# Patient Record
Sex: Male | Born: 1940 | Race: White | Hispanic: No | Marital: Married | Smoking: Former smoker
Health system: Southern US, Community
[De-identification: ages and names within clinical notes are randomized; demographics above are authoritative.]

## PROBLEM LIST (undated history)

## (undated) DIAGNOSIS — R51 Headache: Secondary | ICD-10-CM

## (undated) DIAGNOSIS — I629 Nontraumatic intracranial hemorrhage, unspecified: Secondary | ICD-10-CM

## (undated) DIAGNOSIS — H547 Unspecified visual loss: Secondary | ICD-10-CM

## (undated) DIAGNOSIS — N401 Enlarged prostate with lower urinary tract symptoms: Secondary | ICD-10-CM

## (undated) DIAGNOSIS — K635 Polyp of colon: Secondary | ICD-10-CM

## (undated) DIAGNOSIS — L309 Dermatitis, unspecified: Secondary | ICD-10-CM

## (undated) DIAGNOSIS — I4891 Unspecified atrial fibrillation: Secondary | ICD-10-CM

## (undated) DIAGNOSIS — K219 Gastro-esophageal reflux disease without esophagitis: Secondary | ICD-10-CM

## (undated) DIAGNOSIS — R519 Headache, unspecified: Secondary | ICD-10-CM

## (undated) DIAGNOSIS — M75100 Unspecified rotator cuff tear or rupture of unspecified shoulder, not specified as traumatic: Secondary | ICD-10-CM

## (undated) DIAGNOSIS — I10 Essential (primary) hypertension: Secondary | ICD-10-CM

## (undated) DIAGNOSIS — G629 Polyneuropathy, unspecified: Secondary | ICD-10-CM

## (undated) DIAGNOSIS — I2510 Atherosclerotic heart disease of native coronary artery without angina pectoris: Secondary | ICD-10-CM

## (undated) DIAGNOSIS — M5416 Radiculopathy, lumbar region: Secondary | ICD-10-CM

## (undated) DIAGNOSIS — I472 Ventricular tachycardia, unspecified: Secondary | ICD-10-CM

## (undated) DIAGNOSIS — F419 Anxiety disorder, unspecified: Secondary | ICD-10-CM

## (undated) DIAGNOSIS — J309 Allergic rhinitis, unspecified: Secondary | ICD-10-CM

## (undated) DIAGNOSIS — M545 Low back pain, unspecified: Secondary | ICD-10-CM

## (undated) DIAGNOSIS — N419 Inflammatory disease of prostate, unspecified: Secondary | ICD-10-CM

## (undated) DIAGNOSIS — K649 Unspecified hemorrhoids: Secondary | ICD-10-CM

## (undated) DIAGNOSIS — R251 Tremor, unspecified: Secondary | ICD-10-CM

## (undated) DIAGNOSIS — F329 Major depressive disorder, single episode, unspecified: Secondary | ICD-10-CM

## (undated) DIAGNOSIS — N138 Other obstructive and reflux uropathy: Secondary | ICD-10-CM

## (undated) DIAGNOSIS — J189 Pneumonia, unspecified organism: Secondary | ICD-10-CM

## (undated) DIAGNOSIS — R319 Hematuria, unspecified: Secondary | ICD-10-CM

## (undated) DIAGNOSIS — D649 Anemia, unspecified: Secondary | ICD-10-CM

## (undated) DIAGNOSIS — M5412 Radiculopathy, cervical region: Secondary | ICD-10-CM

## (undated) DIAGNOSIS — J029 Acute pharyngitis, unspecified: Secondary | ICD-10-CM

## (undated) DIAGNOSIS — F1021 Alcohol dependence, in remission: Secondary | ICD-10-CM

## (undated) DIAGNOSIS — H4310 Vitreous hemorrhage, unspecified eye: Secondary | ICD-10-CM

## (undated) DIAGNOSIS — H409 Unspecified glaucoma: Secondary | ICD-10-CM

## (undated) DIAGNOSIS — G4730 Sleep apnea, unspecified: Secondary | ICD-10-CM

## (undated) DIAGNOSIS — N200 Calculus of kidney: Secondary | ICD-10-CM

## (undated) DIAGNOSIS — Z87442 Personal history of urinary calculi: Secondary | ICD-10-CM

## (undated) DIAGNOSIS — F32A Depression, unspecified: Secondary | ICD-10-CM

## (undated) DIAGNOSIS — G63 Polyneuropathy in diseases classified elsewhere: Secondary | ICD-10-CM

## (undated) DIAGNOSIS — I639 Cerebral infarction, unspecified: Secondary | ICD-10-CM

## (undated) DIAGNOSIS — E119 Type 2 diabetes mellitus without complications: Secondary | ICD-10-CM

## (undated) DIAGNOSIS — E785 Hyperlipidemia, unspecified: Secondary | ICD-10-CM

## (undated) DIAGNOSIS — Z950 Presence of cardiac pacemaker: Secondary | ICD-10-CM

## (undated) DIAGNOSIS — R0989 Other specified symptoms and signs involving the circulatory and respiratory systems: Secondary | ICD-10-CM

## (undated) DIAGNOSIS — M1990 Unspecified osteoarthritis, unspecified site: Secondary | ICD-10-CM

## (undated) DIAGNOSIS — R001 Bradycardia, unspecified: Secondary | ICD-10-CM

## (undated) DIAGNOSIS — R58 Hemorrhage, not elsewhere classified: Secondary | ICD-10-CM

## (undated) HISTORY — PX: OTHER SURGICAL HISTORY: SHX169

## (undated) HISTORY — PX: INTRAOCULAR LENS INSERTION: SHX110

## (undated) HISTORY — PX: CIRCUMCISION: SUR203

## (undated) HISTORY — PX: COLONOSCOPY: SHX174

## (undated) HISTORY — PX: SHOULDER SURGERY: SHX246

## (undated) HISTORY — PX: VASECTOMY: SHX75

## (undated) HISTORY — PX: CYSTOSCOPY: SUR368

## (undated) HISTORY — PX: TRACHEOSTOMY: SUR1362

## (undated) HISTORY — PX: URETHROPLASTY: SHX499

---

## 2010-09-19 MED ORDER — MOMETASONE FUROATE 0.1 % EX CREA
0.1 % | CUTANEOUS | Status: DC
Start: 2010-09-19 — End: 2010-11-30

## 2010-09-20 NOTE — Telephone Encounter (Signed)
Requested Prescriptions     Pending Prescriptions Disp Refills   ??? mometasone (ELOCON) 0.1 % cream 45 g      Sig: Apply topically daily.

## 2010-09-21 MED ORDER — MOMETASONE FUROATE 0.1 % EX CREA
0.1 % | CUTANEOUS | Status: DC
Start: 2010-09-21 — End: 2010-11-30

## 2010-11-27 MED ORDER — FLUOXETINE HCL 40 MG PO CAPS
40 MG | ORAL_CAPSULE | ORAL | Status: DC
Start: 2010-11-27 — End: 2010-11-30

## 2010-11-27 NOTE — Telephone Encounter (Signed)
Pt requests refill on medication. Please approve or deny this request.

## 2010-11-30 MED ORDER — LOVASTATIN 40 MG PO TABS
40 MG | ORAL_TABLET | Freq: Every evening | ORAL | Status: DC
Start: 2010-11-30 — End: 2011-12-14

## 2010-11-30 MED ORDER — FLUOXETINE HCL 40 MG PO CAPS
40 MG | ORAL_CAPSULE | Freq: Every day | ORAL | Status: DC
Start: 2010-11-30 — End: 2011-12-14

## 2010-11-30 MED ORDER — LISINOPRIL 20 MG PO TABS
20 MG | ORAL_TABLET | Freq: Every day | ORAL | Status: DC
Start: 2010-11-30 — End: 2011-01-01

## 2010-11-30 MED ORDER — MOMETASONE FUROATE 0.1 % EX CREA
0.1 % | CUTANEOUS | Status: DC
Start: 2010-11-30 — End: 2011-12-14

## 2010-11-30 MED ORDER — NAPROXEN 500 MG PO TABS
500 MG | ORAL_TABLET | Freq: Two times a day (BID) | ORAL | Status: DC
Start: 2010-11-30 — End: 2011-07-24

## 2010-11-30 NOTE — Patient Instructions (Addendum)
Thank you for enrolling in MyChart. Please follow the instructions below to securely access your online medical record. MyChart allows you to send messages to your doctor, view your test results, renew your prescriptions, schedule appointments, and more.     How Do I Sign Up?  1. In your Internet browser, go to [email or web address]  2. Click on the Sign Up Now link in the Sign In box. You will see the New Member Sign Up page.  3. Enter your MyChart Access Code exactly as it appears below. You will not need to use this code after you???ve completed the sign-up process. If you do not sign up before the expiration date, you must request a new code.  MyChart Access Code: [email or web address]  Expires: 01/29/2011  9:58 AM    4. Enter your Social Security Number ([id number]) and Date of Birth (mm/dd/yyyy) as indicated and click Submit. You will be taken to the next sign-up page.  5. Create a MyChart ID. This will be your MyChart login ID and cannot be changed, so think of one that is secure and easy to remember.  6. Create a MyChart password. You can change your password at any time.  7. Enter your Password Reset Question and Answer. This can be used at a later time if you forget your password.   8. Enter your e-mail address. You will receive e-mail notification when new information is available in MyChart.  9. Click Sign Up. You can now view your medical record.     Additional Information  If you have questions, please contact your physician practice where you receive care. Remember, MyChart is NOT to be used for urgent needs. For medical emergencies, dial 911.    High Blood Pressure   (Blood Pressure, High; Essential Hypertension; Idiopathic Hypertension; Primary Hypertension)     Definition   High blood pressure is abnormally high blood pressure with no known cause. Blood pressure measurements are read as two numbers:   ?? Systolic pressure: higher number, normal reading is 120 millimeters of mercury (mmHg) or  less   ?? Diastolic pressure: lower number, normal reading is 80 mmHg or less   High blood pressure is defined as systolic pressure greater than 140 mmHg and/or diastolic pressure greater than 90 mmHg. You are considered prehypertensive if your systolic blood pressure is between 120-139 mmHg, or your diastolic pressure is between 80- 89 mmHg. Your doctor will recommend monitoring and lifestyle changes.   High blood pressure puts stress on the heart, lungs, brain, kidneys, and blood vessels. Over time, this condition can damage these organs and tissues.     Organs Impacted by High Blood Pressure        2011 [employer]   Causes   The cause of essential hypertension is not known.   Risk Factors   These factors increase your chance of developing high blood pressure. Tell your doctor if you have any of these risk factors:   ?? Sex:   ?? Male   ?? Postmenopausal male   ?? Race: Black   ?? Age: middle-aged and older   ?? Overweight   ?? Heavy drinking of alcohol   ?? Smoking   ?? Use of oral contraceptives (birth control pills)   ?? Sedentary lifestyle   ?? Family history   ?? Kidney disease   ?? Diabetes   ?? High-fat, high-salt diet   ?? Stress   Symptoms   High blood pressure usually does not cause symptoms. But, the  condition can still damage your organs and tissues.   Occasionally, if blood pressure reaches extreme levels, you may have the following:   ?? Headache   ?? Blurry or double vision   ?? Abdominal pain   ?? Chest pain   ?? Shortness of breath   ?? Dizziness   Diagnosis   High blood pressure is often diagnosed during a doctor's visit. Blood pressure is measured using an arm cuff and a special device. If your reading is high, you'll come back for repeat checks. If you have two or more visits with readings over 140/90 mmHG, you will be diagnosed with high blood pressure.   Your doctor will order tests to make sure your high blood pressure is not caused by another condition. You will also be tested to see if the  high blood pressure has cause any problems.   Tests include:   ?? Blood tests   ?? Urine tests   ?? Chest x-rays a test that uses radiation to take a picture of structures inside the body   ?? Electrocardiogram (ECG, EKG) a test that records the heart's activity by measuring electrical currents through the heart muscle   Treatment   Lifestyle Changes   ?? Maintain a healthy weight .   ?? Begin a safe exercise program with the advice of your doctor.   ?? If you smoke, quit .   ?? Eat a healthful diet , one that is low-fat, low-salt, and rich in fiber , fruits, and vegetables. Your doctor may recommend the DASH diet , which is designed to reduce blood pressure.   ?? Drink alcohol in moderation (no more than two drinks per day for men, one drink per day for women).   ?? Manage stress .   Medications   These include:   ?? Diuretics   ?? Beta blockers   ?? Angiotensin converting enzyme inhibitors (ACE inhibitors)   ?? Calcium channel blockers   ?? Angiotensin receptor blockers   ?? Aldosterone blockers   ?? Alpha blockers   ?? Alpha-beta blockers   ?? Nervous system inhibitors   ?? Vasodilators   Note: Untreated high blood pressure can lead to:   ?? Heart disease   ?? Heart attack   ?? Stroke   ?? Kidney damage   If you are diagnosed with high blood pressure, follow your doctor's instructions .   Prevention   To help reduce your risk of getting high blood pressure, take the following steps:   ?? Eat a well-balanced diet. The DASH diet rich in fruits, vegetables and low-fat dairy foods, and low in saturated fat, total fat, and cholesterolmay help keep your blood pressure in the healthy range.   ?? Exercise regularly.   ?? Maintain a healthy weight. (Your body mass index should be below 25.)   ?? If you smoke, quit .   ?? Drink alcohol in moderation. Moderate is two or fewer drinks per day for men and one or fewer drinks per day for women and older adults.     Last Reviewed: September 2010 Christian Mate. [place], DO   Updated: 08/29/2009

## 2010-11-30 NOTE — Progress Notes (Signed)
Patient is here today for annual check up. He is fasting for blood work.  Chief Complaint   Patient presents with   ??? Annual Exam     is fasting for blood work       Patient presents for  exam.        Patient Active Problem List   Diagnoses   ??? Other and unspecified hyperlipidemia   ??? HTN (hypertension)   ??? Osteoarthrosis, unspecified whether generalized or localized, unspecified site   ??? Depressed   ??? Dermatitis       has a current medication list which includes the following prescription(s): lovastatin, lisinopril, naproxen, fluoxetine, and mometasone.    History reviewed. No pertinent past medical history.    History reviewed. No pertinent past surgical history.    family history is not on file.    History     Social History   ??? Marital Status: Single     Spouse Name: N/A     Number of Children: N/A   ??? Years of Education: N/A     Occupational History   ??? Not on file.     Social History Main Topics   ??? Smoking status: Former Smoker -- 1.0 packs/day for 40 years     Quit date: 12/01/1995   ??? Smokeless tobacco: Never Used   ??? Alcohol Use:    ??? Drug Use:    ??? Sexually Active:      Other Topics Concern   ??? Not on file     Social History Narrative   ??? No narrative on file       No Known Allergies    Review of Systems - General ROS: negative  Psychological ROS: negative  ENT ROS: negative  Hematological and Lymphatic ROS: negative  Endocrine ROS: negative  Respiratory ROS: no cough, shortness of breath, or wheezing  Cardiovascular ROS: no chest pain or dyspnea on exertion  Gastrointestinal ROS: no abdominal pain, change in bowel habits, or black or bloody stools  Genito-Urinary ROS: no dysuria, trouble voiding, or hematuria  Musculoskeletal ROS: negative  Neurological ROS: no TIA or stroke symptoms  Dermatological ROS: negative    Blood pressure 120/80, pulse 72, temperature 97.1 ??F (36.2 ??C), temperature source Oral, resp. rate 16, height 5' 11.5" (1.816 m), weight 256 lb 1.6 oz (116.166 kg).    Physical Examination:  General appearance - alert, well appearing, and in no distress  Mental status - alert, oriented to person, place, and time  Eyes - pupils equal and reactive, extraocular eye movements intact  Ears - bilateral TM's and external ear canals normal  Mouth - mucous membranes moist, pharynx normal without lesions  Neck - supple, no significant adenopathy  Lymphatics - no palpable lymphadenopathy, no hepatosplenomegaly  Chest - clear to auscultation, no wheezes, rales or rhonchi, symmetric air entry  Heart - normal rate, regular rhythm, normal S1, S2, no murmurs, rubs, clicks or gallops  Abdomen - soft, nontender, nondistended, no masses or organomegaly  GU Male - no penile lesions or discharge, no testicular masses or tenderness, no hernias  Rectal - negative without mass, lesions or tenderness  Neurological - alert, oriented, normal speech, no focal findings or movement disorder noted  Musculoskeletal - no joint tenderness, deformity or swelling  Extremities - peripheral pulses normal, no pedal edema, no clubbing or cyanosis  Skin - normal coloration and turgor, no rashes, no suspicious skin lesions noted;    Assessment:    1. Dermatitis  2. Depressed     3. Osteoarthrosis, unspecified whether generalized or localized, unspecified site     4. HTN (hypertension)  Comprehensive metabolic panel, Lipid panel, EKG 12 lead, Stress test, myoview   5. Other and unspecified hyperlipidemia  Comprehensive metabolic panel, Lipid panel, Stress test, myoview   6. Prostate cancer screening  Psa screening   7. DOE (dyspnea on exertion)  Stress test, myoview       Plan:    Orders Placed This Encounter   Procedures   ??? Comprehensive metabolic panel   ??? Lipid panel     Order Specific Question:  Is Patient Fasting?/# of Hours     Answer:  12   ??? Psa screening   ??? Stress test, myoview     Order Specific Question:  Reason for Exam?     Answer:  Shortness of breath   ??? EKG 12 lead     Order Specific Question:  Reason for Exam?     Answer:   Hypertension       Outpatient Encounter Prescriptions as of 11/30/2010   Medication Sig Dispense Refill   ??? lovastatin (MEVACOR) 40 MG tablet Take 1 tablet by mouth nightly.  90 tablet  3   ??? lisinopril (PRINIVIL;ZESTRIL) 20 MG tablet Take 1 tablet by mouth daily.  90 tablet  3   ??? naproxen (NAPROSYN) 500 MG tablet Take 1 tablet by mouth 2 times daily (with meals).  90 tablet  3   ??? fluoxetine (PROZAC) 40 MG capsule Take 1 capsule by mouth daily.  90 capsule  3   ??? mometasone (ELOCON) 0.1 % cream Apply topically daily.  45 g  3   ??? DISCONTD: lovastatin (MEVACOR) 40 MG tablet Take 40 mg by mouth nightly.         ??? DISCONTD: lisinopril (PRINIVIL;ZESTRIL) 20 MG tablet Take 20 mg by mouth daily.         ??? DISCONTD: naproxen (NAPROSYN) 500 MG tablet Take 500 mg by mouth 2 times daily (with meals).         ??? DISCONTD: fluoxetine (PROZAC) 40 MG capsule TAKE 1 CAPSULE DAILY  90 capsule  2   ??? DISCONTD: mometasone (ELOCON) 0.1 % cream Apply topically daily.  45 g  3   ??? DISCONTD: mometasone (ELOCON) 0.1 % cream APPLY TO THE AFFECTED AREA TWICE DAILY  45 g  3         Return in about 3 months (around 03/01/2011).    Patient education provided. They understand and agree with this course of treatment. They will return with new or worsening symptoms. Patient instructed to remain current with appropriate annual health maintenance.

## 2010-12-01 LAB — COMPREHENSIVE METABOLIC PANEL
ALT: 22 U/L (ref 0–63)
AST: 27 U/L (ref 0–40)
Albumin: 4.3 g/dL (ref 3.5–5.2)
Alk Phosphatase: 104 U/L (ref 40–120)
Anion Gap: 11 mEq/L (ref 7–13)
BUN: 20 mg/dL (ref 10–26)
CO2: 26 mEq/L (ref 22–32)
Calcium: 9 mg/dL (ref 8.5–10.2)
Chloride: 105 mEq/L (ref 99–111)
Creatinine: 0.86 mg/dL (ref 0.70–1.40)
GFR African American: 106.6
GFR Non-African American: 88.1
Globulin: 2.9 g/dL (ref 2.3–3.5)
Glucose: 78 mg/dL (ref 60–115)
Potassium: 4.8 mEq/L (ref 3.5–5.5)
Sodium: 142 mEq/L (ref 135–146)
Total Bilirubin: 0.5 mg/dL (ref 0.2–1.1)
Total Protein: 7.2 g/dL (ref 6.4–8.1)

## 2010-12-01 LAB — LIPID PANEL
Chol/HDL Ratio: 3.6
Cholesterol: 174 mg/dL (ref 0–199)
HDL: 48 mg/dL (ref 40–59)
LDL Calculated: 106 mg/dL (ref 0–129)
LDL/HDL Ratio: 2.2 (ref 0.0–2.9)
Triglycerides: 127 mg/dL (ref 0–149)

## 2010-12-01 LAB — PSA SCREENING: PSA: 0.46 ng/mL (ref 0.00–4.00)

## 2011-01-01 MED ORDER — LISINOPRIL 20 MG PO TABS
20 MG | ORAL_TABLET | ORAL | Status: DC
Start: 2011-01-01 — End: 2011-09-05

## 2011-02-05 ENCOUNTER — Inpatient Hospital Stay
Admission: AD | Admit: 2011-02-05 | Discharge: 2011-03-01 | Disposition: A | Discharge status: Home / Self Care | Payer: Self-pay | Source: Ambulatory Visit | Attending: Internal Medicine | Admitting: Internal Medicine

## 2011-02-05 LAB — URINALYSIS, ROUTINE W REFLEX MICROSCOPIC
Bilirubin Urine: NEGATIVE
Ketones, ur: NEGATIVE mg/dL
Nitrite: NEGATIVE
Protein, ur: NEGATIVE mg/dL
Urobilinogen, UA: 0.2 mg/dL (ref 0.0–1.0)

## 2011-02-06 ENCOUNTER — Other Ambulatory Visit (HOSPITAL_COMMUNITY): Payer: Self-pay | Attending: Internal Medicine

## 2011-02-06 LAB — COMPREHENSIVE METABOLIC PANEL
AST: 39 U/L — ABNORMAL HIGH (ref 0–37)
Albumin: 2.3 g/dL — ABNORMAL LOW (ref 3.5–5.2)
BUN: 13 mg/dL (ref 6–23)
CO2: 28 mEq/L (ref 19–32)
Calcium: 8.7 mg/dL (ref 8.4–10.5)
Chloride: 105 mEq/L (ref 96–112)
Creatinine, Ser: 0.64 mg/dL (ref 0.4–1.5)
GFR calc Af Amer: >60 mL/min (ref >60)
GFR calc non Af Amer: >60 mL/min (ref >60)
Total Bilirubin: 0.5 mg/dL (ref 0.3–1.2)

## 2011-02-06 LAB — URINE CULTURE: Culture  Setup Time: 201202271852

## 2011-02-06 LAB — PREALBUMIN: Prealbumin: 14.1 mg/dL — ABNORMAL LOW (ref 17.0–34.0)

## 2011-02-06 LAB — DIFFERENTIAL
Basophils Relative: 0 % (ref 0–1)
Eosinophils Absolute: 0.2 10*3/uL (ref 0.0–0.7)
Lymphs Abs: 0.9 10*3/uL (ref 0.7–4.0)
Monocytes Absolute: 0.8 10*3/uL (ref 0.1–1.0)
Monocytes Relative: 12 % (ref 3–12)

## 2011-02-06 LAB — TSH: TSH: 0.489 u[IU]/mL (ref 0.350–4.500)

## 2011-02-06 LAB — CBC
MCH: 30.6 pg (ref 26.0–34.0)
MCHC: 32.7 g/dL (ref 30.0–36.0)
MCV: 93.6 fL (ref 78.0–100.0)
Platelets: 319 10*3/uL (ref 150–400)

## 2011-02-06 LAB — PHOSPHORUS: Phosphorus: 3 mg/dL (ref 2.3–4.6)

## 2011-02-07 ENCOUNTER — Other Ambulatory Visit (HOSPITAL_COMMUNITY): Payer: Self-pay | Attending: Internal Medicine

## 2011-02-07 LAB — URINALYSIS, ROUTINE W REFLEX MICROSCOPIC
Ketones, ur: NEGATIVE mg/dL
Protein, ur: 30 mg/dL — AB
Urine Glucose, Fasting: NEGATIVE mg/dL
Urobilinogen, UA: 0.2 mg/dL (ref 0.0–1.0)

## 2011-02-07 LAB — URINE MICROSCOPIC-ADD ON

## 2011-02-07 LAB — CBC
HCT: 34.6 % — ABNORMAL LOW (ref 39.0–52.0)
Hemoglobin: 11.3 g/dL — ABNORMAL LOW (ref 13.0–17.0)
MCV: 92 fL (ref 78.0–100.0)
RDW: 13.6 % (ref 11.5–15.5)
WBC: 9.5 10*3/uL (ref 4.0–10.5)

## 2011-02-07 LAB — COMPREHENSIVE METABOLIC PANEL
ALT: 28 U/L (ref 0–53)
Alkaline Phosphatase: 150 U/L — ABNORMAL HIGH (ref 39–117)
BUN: 15 mg/dL (ref 6–23)
CO2: 28 mEq/L (ref 19–32)
Chloride: 103 mEq/L (ref 96–112)
GFR calc non Af Amer: >60 mL/min (ref >60)
Glucose, Bld: 164 mg/dL — ABNORMAL HIGH (ref 70–99)
Potassium: 4.3 mEq/L (ref 3.5–5.1)
Sodium: 139 mEq/L (ref 135–145)
Total Bilirubin: 0.5 mg/dL (ref 0.3–1.2)
Total Protein: 6.8 g/dL (ref 6.0–8.3)

## 2011-02-09 LAB — CBC
HCT: 31.7 % — ABNORMAL LOW (ref 39.0–52.0)
Hemoglobin: 10.2 g/dL — ABNORMAL LOW (ref 13.0–17.0)
MCH: 30.2 pg (ref 26.0–34.0)
MCHC: 32.2 g/dL (ref 30.0–36.0)
RDW: 14.1 % (ref 11.5–15.5)

## 2011-02-09 LAB — BASIC METABOLIC PANEL
CO2: 26 mEq/L (ref 19–32)
Calcium: 8.4 mg/dL (ref 8.4–10.5)
Creatinine, Ser: 0.77 mg/dL (ref 0.4–1.5)
GFR calc Af Amer: >60 mL/min (ref >60)
GFR calc non Af Amer: >60 mL/min (ref >60)
Glucose, Bld: 172 mg/dL — ABNORMAL HIGH (ref 70–99)

## 2011-02-09 LAB — URINE CULTURE
Colony Count: 85000
Culture  Setup Time: 201202292126

## 2011-02-11 LAB — URINE CULTURE
Colony Count: NO GROWTH
Culture: NO GROWTH

## 2011-02-12 DIAGNOSIS — I619 Nontraumatic intracerebral hemorrhage, unspecified: Secondary | ICD-10-CM

## 2011-02-12 DIAGNOSIS — Z930 Tracheostomy status: Secondary | ICD-10-CM

## 2011-02-12 DIAGNOSIS — J9600 Acute respiratory failure, unspecified whether with hypoxia or hypercapnia: Secondary | ICD-10-CM

## 2011-02-12 LAB — CBC
Platelets: 272 10*3/uL (ref 150–400)
RBC: 3.95 MIL/uL — ABNORMAL LOW (ref 4.22–5.81)
WBC: 7.7 10*3/uL (ref 4.0–10.5)

## 2011-02-12 LAB — BASIC METABOLIC PANEL
Chloride: 106 mEq/L (ref 96–112)
GFR calc Af Amer: >60 mL/min (ref >60)
Potassium: 3.8 mEq/L (ref 3.5–5.1)
Sodium: 140 mEq/L (ref 135–145)

## 2011-02-12 LAB — PREALBUMIN: Prealbumin: 13 mg/dL — ABNORMAL LOW (ref 17.0–34.0)

## 2011-02-14 ENCOUNTER — Other Ambulatory Visit (HOSPITAL_COMMUNITY): Payer: Self-pay | Attending: Internal Medicine

## 2011-02-16 LAB — BASIC METABOLIC PANEL
BUN: 16 mg/dL (ref 6–23)
Creatinine, Ser: 0.71 mg/dL (ref 0.4–1.5)
GFR calc Af Amer: >60 mL/min (ref >60)
GFR calc non Af Amer: >60 mL/min (ref >60)
Potassium: 4.2 mEq/L (ref 3.5–5.1)

## 2011-02-16 LAB — URINALYSIS, ROUTINE W REFLEX MICROSCOPIC
Ketones, ur: NEGATIVE mg/dL
Leukocytes, UA: NEGATIVE
Nitrite: NEGATIVE
Protein, ur: NEGATIVE mg/dL
Urobilinogen, UA: 1 mg/dL (ref 0.0–1.0)

## 2011-02-16 LAB — CBC
MCH: 30.4 pg (ref 26.0–34.0)
MCV: 93.5 fL (ref 78.0–100.0)
Platelets: 298 10*3/uL (ref 150–400)
RDW: 13.5 % (ref 11.5–15.5)
WBC: 6.6 10*3/uL (ref 4.0–10.5)

## 2011-02-18 LAB — URINE CULTURE
Colony Count: NO GROWTH
Culture: NO GROWTH

## 2011-02-19 LAB — DIGOXIN LEVEL: Digoxin Level: 0.2 ng/mL — ABNORMAL LOW (ref 0.8–2.0)

## 2011-02-19 LAB — POTASSIUM: Potassium: 4.5 mEq/L (ref 3.5–5.1)

## 2011-02-21 LAB — BASIC METABOLIC PANEL
CO2: 25 mEq/L (ref 19–32)
Calcium: 8.6 mg/dL (ref 8.4–10.5)
Creatinine, Ser: 0.68 mg/dL (ref 0.4–1.5)
GFR calc Af Amer: >60 mL/min (ref >60)

## 2011-02-21 LAB — CBC
Hemoglobin: 11.2 g/dL — ABNORMAL LOW (ref 13.0–17.0)
MCH: 30.6 pg (ref 26.0–34.0)
MCHC: 33.6 g/dL (ref 30.0–36.0)
Platelets: 213 10*3/uL (ref 150–400)

## 2011-02-24 LAB — URINALYSIS, ROUTINE W REFLEX MICROSCOPIC
Nitrite: POSITIVE — AB
Specific Gravity, Urine: 1.023 (ref 1.005–1.030)
Urobilinogen, UA: 2 mg/dL — ABNORMAL HIGH (ref 0.0–1.0)

## 2011-02-24 LAB — URINE MICROSCOPIC-ADD ON

## 2011-02-24 LAB — DIFFERENTIAL
Basophils Absolute: 0 10*3/uL (ref 0.0–0.1)
Eosinophils Absolute: 0.1 10*3/uL (ref 0.0–0.7)
Eosinophils Relative: 1 % (ref 0–5)
Lymphocytes Relative: 17 % (ref 12–46)

## 2011-02-24 LAB — CBC
HCT: 32.6 % — ABNORMAL LOW (ref 39.0–52.0)
Platelets: 229 10*3/uL (ref 150–400)
RDW: 13.9 % (ref 11.5–15.5)
WBC: 8.2 10*3/uL (ref 4.0–10.5)

## 2011-02-24 LAB — BASIC METABOLIC PANEL
Calcium: 8.7 mg/dL (ref 8.4–10.5)
GFR calc non Af Amer: >60 mL/min (ref >60)
Glucose, Bld: 120 mg/dL — ABNORMAL HIGH (ref 70–99)
Sodium: 137 mEq/L (ref 135–145)

## 2011-02-25 LAB — HEPATIC FUNCTION PANEL
ALT: 21 U/L (ref 0–53)
AST: 25 U/L (ref 0–37)
Alkaline Phosphatase: 93 U/L (ref 39–117)
Bilirubin, Direct: 0.1 mg/dL (ref 0.0–0.3)
Indirect Bilirubin: 0.4 mg/dL (ref 0.3–0.9)
Total Protein: 6.4 g/dL (ref 6.0–8.3)

## 2011-02-25 LAB — BASIC METABOLIC PANEL
BUN: 10 mg/dL (ref 6–23)
Chloride: 108 mEq/L (ref 96–112)
Glucose, Bld: 177 mg/dL — ABNORMAL HIGH (ref 70–99)
Potassium: 4 mEq/L (ref 3.5–5.1)

## 2011-02-26 DIAGNOSIS — F39 Unspecified mood [affective] disorder: Secondary | ICD-10-CM

## 2011-02-28 LAB — DIFFERENTIAL
Basophils Absolute: 0 10*3/uL (ref 0.0–0.1)
Eosinophils Absolute: 0.1 10*3/uL (ref 0.0–0.7)
Eosinophils Relative: 1 % (ref 0–5)
Lymphocytes Relative: 10 % — ABNORMAL LOW (ref 12–46)
Monocytes Absolute: 1 10*3/uL (ref 0.1–1.0)
Monocytes Relative: 10 % (ref 3–12)
Neutro Abs: 8.4 10*3/uL — ABNORMAL HIGH (ref 1.7–7.7)

## 2011-02-28 LAB — CBC
HCT: 31.8 % — ABNORMAL LOW (ref 39.0–52.0)
Hemoglobin: 10.4 g/dL — ABNORMAL LOW (ref 13.0–17.0)
MCH: 29.6 pg (ref 26.0–34.0)
MCHC: 32.7 g/dL (ref 30.0–36.0)
RDW: 14 % (ref 11.5–15.5)

## 2011-02-28 LAB — BASIC METABOLIC PANEL
BUN: 10 mg/dL (ref 6–23)
CO2: 24 mEq/L (ref 19–32)
Calcium: 9 mg/dL (ref 8.4–10.5)
Creatinine, Ser: 0.6 mg/dL (ref 0.4–1.5)
GFR calc non Af Amer: >60 mL/min (ref >60)
Glucose, Bld: 135 mg/dL — ABNORMAL HIGH (ref 70–99)
Sodium: 139 mEq/L (ref 135–145)

## 2011-07-25 MED ORDER — NAPROXEN 500 MG PO TABS
500 MG | ORAL_TABLET | Freq: Two times a day (BID) | ORAL | Status: DC
Start: 2011-07-25 — End: 2011-12-14

## 2011-07-26 NOTE — Telephone Encounter (Signed)
Patient aware that rx ready for pick up.

## 2011-09-05 NOTE — Telephone Encounter (Signed)
PT LAST SEEN 11/30/10.

## 2011-09-06 MED ORDER — LISINOPRIL 20 MG PO TABS
20 MG | ORAL_TABLET | Freq: Every day | ORAL | Status: DC
Start: 2011-09-06 — End: 2011-09-06

## 2011-09-06 MED ORDER — LISINOPRIL 20 MG PO TABS
20 MG | ORAL_TABLET | Freq: Every day | ORAL | Status: DC
Start: 2011-09-06 — End: 2011-12-14

## 2011-09-06 NOTE — Telephone Encounter (Signed)
RX RE-APPROVED TO BE SENT ELECTRONICALLY TO [facility].  PAPER RX SHREDDED.

## 2011-09-28 DIAGNOSIS — H4060X0 Glaucoma secondary to drugs, unspecified eye, stage unspecified: Secondary | ICD-10-CM | POA: Insufficient documentation

## 2011-12-03 NOTE — Telephone Encounter (Signed)
PT LAST SEEN 11/30/10.  NO FUTURE APPTS.

## 2011-12-06 NOTE — Telephone Encounter (Signed)
SPOKE WITH PT.  MADE APPT FOR 12/25/11.

## 2011-12-14 LAB — COMPREHENSIVE METABOLIC PANEL
ALT: 31 U/L (ref 0–63)
AST: 34 U/L (ref 0–40)
Albumin: 4.1 g/dL (ref 3.5–5.2)
Alk Phosphatase: 101 U/L (ref 40–120)
Anion Gap: 6 mEq/L — ABNORMAL LOW (ref 7–13)
BUN: 18 mg/dL (ref 10–26)
CO2: 30 mEq/L (ref 22–32)
Calcium: 9.1 mg/dL (ref 8.5–10.2)
Chloride: 104 mEq/L (ref 99–111)
Creatinine: 0.93 mg/dL (ref 0.70–1.40)
GFR African American: 97.1
GFR Non-African American: 80.3
Globulin: 2.9 g/dL (ref 2.3–3.5)
Glucose: 103 mg/dL (ref 60–115)
Potassium: 5 mEq/L (ref 3.5–5.5)
Sodium: 140 mEq/L (ref 135–146)
Total Bilirubin: 0.4 mg/dL (ref 0.2–1.1)
Total Protein: 7 g/dL (ref 6.4–8.1)

## 2011-12-14 LAB — LIPID PANEL
Chol/HDL Ratio: 3.6
Cholesterol: 192 mg/dL (ref 0–199)
HDL: 53 mg/dL (ref 40–59)
LDL Calculated: 121 mg/dL (ref 0–129)
LDL/HDL Ratio: 2.3 (ref 0.0–2.9)
Triglycerides: 112 mg/dL (ref 0–149)

## 2011-12-14 LAB — PSA SCREENING: PSA: 0.38 ng/mL (ref 0.00–4.00)

## 2011-12-14 MED ORDER — LISINOPRIL 20 MG PO TABS
20 MG | ORAL_TABLET | Freq: Every day | ORAL | Status: DC
Start: 2011-12-14 — End: 2013-01-01

## 2011-12-14 MED ORDER — NAPROXEN 500 MG PO TABS
500 MG | ORAL_TABLET | Freq: Two times a day (BID) | ORAL | Status: DC
Start: 2011-12-14 — End: 2013-01-01

## 2011-12-14 MED ORDER — FLUOXETINE HCL 40 MG PO CAPS
40 MG | ORAL_CAPSULE | Freq: Every day | ORAL | Status: DC
Start: 2011-12-14 — End: 2013-01-01

## 2011-12-14 MED ORDER — LOVASTATIN 40 MG PO TABS
40 MG | ORAL_TABLET | Freq: Every evening | ORAL | Status: DC
Start: 2011-12-14 — End: 2013-06-26

## 2011-12-14 MED ORDER — MOMETASONE FUROATE 0.1 % EX CREA
0.1 % | CUTANEOUS | Status: DC
Start: 2011-12-14 — End: 2013-01-01

## 2011-12-14 NOTE — Progress Notes (Signed)
Chief Complaint   Patient presents with   . Annual Exam   . Medication Refill   no cp/sob    Obese should lose weight    bp stable    oa stable    Patient presents for  exam.        Patient Active Problem List   Diagnoses   . Other and unspecified hyperlipidemia   . HTN (hypertension)   . Osteoarthrosis, unspecified whether generalized or localized, unspecified site   . Depressed   . Dermatitis       has a current medication list which includes the following prescription(s): lisinopril, naproxen, lovastatin, fluoxetine, and mometasone.    Past Medical History   Diagnosis Date   . Hand eczema    . Diverticulitis    . Anxiety        Past Surgical History   Procedure Date   . Colonoscopy 12/31/07     DR FLEITES   . Tonsillectomy        family history includes Heart Disease in his mother; Liver Cancer in his father; and Prostate Cancer in his brother.    History     Social History   . Marital Status: Single     Spouse Name: N/A     Number of Children: N/A   . Years of Education: N/A     Occupational History   . Not on file.     Social History Main Topics   . Smoking status: Former Smoker -- 1.0 packs/day for 40 years     Quit date: 12/01/1995   . Smokeless tobacco: Never Used   . Alcohol Use:    . Drug Use:    . Sexually Active:      Other Topics Concern   . Not on file     Social History Narrative   . No narrative on file       No Known Allergies    Review of Systems - General ROS: negative  Psychological ROS: negative  ENT ROS: negative  Hematological and Lymphatic ROS: negative  Endocrine ROS: negative  Respiratory ROS: no cough, shortness of breath, or wheezing  Cardiovascular ROS: no chest pain or dyspnea on exertion  Gastrointestinal ROS: no abdominal pain, change in bowel habits, or black or bloody stools  Genito-Urinary ROS: no dysuria, trouble voiding, or hematuria  Musculoskeletal ROS: negative  Neurological ROS: no TIA or stroke symptoms  Dermatological ROS: negative    Blood pressure 120/80, pulse 68,  temperature 98 F (36.7 C), temperature source Oral, resp. rate 14, height 6' (1.829 m), weight 249 lb (112.946 kg).    Physical Examination: General appearance - alert, well appearing, and in no distress  Mental status - alert, oriented to person, place, and time  Eyes - pupils equal and reactive, extraocular eye movements intact  Ears - bilateral TM's and external ear canals normal  Mouth - mucous membranes moist, pharynx normal without lesions  Neck - supple, no significant adenopathy  Lymphatics - no palpable lymphadenopathy, no hepatosplenomegaly  Chest - clear to auscultation, no wheezes, rales or rhonchi, symmetric air entry  Heart - normal rate, regular rhythm, normal S1, S2, no murmurs, rubs, clicks or gallops  Abdomen - soft, nontender, nondistended, no masses or organomegaly  Neurological - alert, oriented, normal speech, no focal findings or movement disorder noted  Musculoskeletal - no joint tenderness, deformity or swelling  Extremities - peripheral pulses normal, no pedal edema, no clubbing or cyanosis  Skin -  normal coloration and turgor, no rashes, no suspicious skin lesions noted;    Assessment:    1. HTN (hypertension)  Comprehensive metabolic panel, Lipid panel   2. Osteoarthrosis, unspecified whether generalized or localized, unspecified site  Comprehensive metabolic panel, Lipid panel   3. Depressed  Comprehensive metabolic panel, Lipid panel   4. Dermatitis  Comprehensive metabolic panel, Lipid panel   5. Knee pain  Comprehensive metabolic panel, Lipid panel   6. Prostate cancer screening  Comprehensive metabolic panel, Lipid panel, Psa screening       Plan:    Orders Placed This Encounter   Procedures   . Comprehensive metabolic panel   . Lipid panel     Order Specific Question:  Is Patient Fasting?/# of Hours     Answer:  12   . Psa screening       Outpatient Encounter Prescriptions as of 12/14/2011   Medication Sig Dispense Refill   . lisinopril (PRINIVIL;ZESTRIL) 20 MG tablet Take 1 tablet  by mouth daily.  90 tablet  3   . naproxen (NAPROSYN) 500 MG tablet Take 1 tablet by mouth 2 times daily (with meals).  90 tablet  3   . lovastatin (MEVACOR) 40 MG tablet Take 1 tablet by mouth nightly.  90 tablet  3   . FLUoxetine (PROZAC) 40 MG capsule Take 1 capsule by mouth daily.  90 capsule  3   . mometasone (ELOCON) 0.1 % cream Apply topically daily.  45 g  3   . DISCONTD: lisinopril (PRINIVIL;ZESTRIL) 20 MG tablet Take 1 tablet by mouth daily.  90 tablet  1   . DISCONTD: naproxen (NAPROSYN) 500 MG tablet Take 1 tablet by mouth 2 times daily (with meals).  90 tablet  3   . DISCONTD: lovastatin (MEVACOR) 40 MG tablet Take 1 tablet by mouth nightly.  90 tablet  3   . DISCONTD: fluoxetine (PROZAC) 40 MG capsule Take 1 capsule by mouth daily.  90 capsule  3   . DISCONTD: mometasone (ELOCON) 0.1 % cream Apply topically daily.  45 g  3     Recommend lifestyle modification.      Return in about 6 months (around 06/12/2012).    Patient education provided. They understand and agree with this course of treatment. They will return with new or worsening symptoms. Patient instructed to remain current with appropriate annual health maintenance.

## 2012-02-25 DIAGNOSIS — Z45018 Encounter for adjustment and management of other part of cardiac pacemaker: Secondary | ICD-10-CM

## 2012-02-25 DIAGNOSIS — G4733 Obstructive sleep apnea (adult) (pediatric): Secondary | ICD-10-CM | POA: Diagnosis present

## 2012-12-16 MED ORDER — LOVASTATIN 20 MG PO TABS
20 MG | ORAL_TABLET | ORAL | Status: DC
Start: 2012-12-16 — End: 2013-01-01

## 2012-12-16 NOTE — Telephone Encounter (Signed)
DR. Alan Ripper PT, LAST SEEN 12/14/11.

## 2013-01-01 MED ORDER — LOVASTATIN 20 MG PO TABS
20 MG | ORAL_TABLET | Freq: Every evening | ORAL | Status: DC
Start: 2013-01-01 — End: 2013-06-25

## 2013-01-01 MED ORDER — LISINOPRIL 20 MG PO TABS
20 MG | ORAL_TABLET | Freq: Every day | ORAL | Status: DC
Start: 2013-01-01 — End: 2014-02-25

## 2013-01-01 MED ORDER — NAPROXEN 500 MG PO TABS
500 MG | ORAL_TABLET | Freq: Two times a day (BID) | ORAL | Status: DC
Start: 2013-01-01 — End: 2014-02-25

## 2013-01-01 MED ORDER — MOMETASONE FUROATE 0.1 % EX CREA
0.1 % | CUTANEOUS | Status: DC
Start: 2013-01-01 — End: 2014-02-25

## 2013-01-01 MED ORDER — FLUOXETINE HCL 40 MG PO CAPS
40 MG | ORAL_CAPSULE | Freq: Every day | ORAL | Status: DC
Start: 2013-01-01 — End: 2014-02-25

## 2013-01-01 NOTE — Patient Instructions (Signed)
Learning About High Blood Pressure  What is high blood pressure?  Blood pressure is a measure of how hard the blood pushes against the walls of your arteries. It's normal for blood pressure to go up and down throughout the day, but if it stays up, you have high blood pressure. Another name for high blood pressure is hypertension.  Two numbers tell you your blood pressure. The first number is the systolic pressure. It shows how hard the blood pushes when your heart is pumping. The second number is the diastolic pressure. It shows how hard the blood pushes between heartbeats, when your heart is relaxed and filling with blood.  Adults should have a blood pressure of less than 120/80 (say "120 over 80"). High blood pressure is 140/90 or higher. Many people fall into the category in between, called prehypertension. People with prehypertension need to make lifestyle changes to bring their blood pressure down and help prevent or delay high blood pressure.  What happens when you have high blood pressure?  ?? Blood flows through your arteries with too much force. Over time, this damages the walls of your arteries. But you can't feel it. High blood pressure usually doesn't cause symptoms.  ?? Fat and calcium start to build up in your arteries. This buildup is called plaque. Plaque makes your arteries narrower and stiffer. Blood can't flow through them as easily.  ?? This lack of good blood flow starts to damage some of the organs in your body. This can lead to problems such as coronary artery disease and heart attack, heart failure, stroke, kidney failure, and eye damage.  How can you prevent high blood pressure?  ?? Stay at a healthy weight.  ?? Try to limit how much sodium you eat to less than 2,300 milligrams (mg) a day. And try to limit the sodium you eat to less than 1,500 mg a day if you are 51 or older, are black, or have high blood pressure, diabetes, or chronic kidney disease.  ?? Buy foods that are labeled "unsalted,"  "sodium-free," or "low-sodium." Foods labeled "reduced-sodium" and "light sodium" may still have too much sodium.  ?? Flavor your food with garlic, lemon juice, onion, vinegar, herbs, and spices instead of salt. Do not use soy sauce, steak sauce, onion salt, garlic salt, mustard, or ketchup on your food.  ?? Use less salt (or none) when recipes call for it. You can often use half the salt a recipe calls for without losing flavor.  ?? Be physically active. Get at least 30 minutes of exercise on most days of the week. Walking is a good choice. You also may want to do other activities, such as running, swimming, cycling, or playing tennis or team sports.  ?? Limit alcohol to 2 drinks a day for men and 1 drink a day for women.  ?? Eat plenty of fruits, vegetables, and low-fat dairy products. Eat less saturated and total fats.  How is high blood pressure treated?  ?? Your doctor will suggest making lifestyle changes. For example, your doctor may ask you to eat healthy foods, quit smoking, lose extra weight, and be more active.  ?? If lifestyle changes don't help enough or your blood pressure is very high, you will have to take medicine every day.  Follow-up care is a key part of your treatment and safety. Be sure to make and go to all appointments, and call your doctor if you are having problems. It's also a good idea to know   your test results and keep a list of the medicines you take.   Where can you learn more?   Go to [email or web address] and sign in to your MyChart account. Enter P501 in the Search Health Information box to learn more about ???Learning About High Blood Pressure.???    If you do not have an account, please click on the ???Sign Up Now??? link.     ?? 2006-2013 [employer]. Care instructions adapted under license by [facility]. This care instruction is for use with your licensed healthcare professional. If you have questions about a medical condition or this instruction,  always ask your healthcare professional. [employer] disclaims any warranty or liability for your use of this information.  Content Version: 9.7.130178; Last Revised: May 05, 2010

## 2013-01-01 NOTE — Progress Notes (Signed)
Chief Complaint   Patient presents with   ??? Annual Exam   ??? Medication Refill   no cp/sob    Obese should lose weight    bp stable    oa stable    Patient presents for  exam.        Patient Active Problem List   Diagnosis   ??? Other and unspecified hyperlipidemia   ??? HTN (hypertension)   ??? Osteoarthrosis, unspecified whether generalized or localized, unspecified site   ??? Depressed   ??? Dermatitis       has a current medication list which includes the following prescription(s): mometasone, lovastatin, lisinopril, naproxen, fluoxetine, and lovastatin.    Past Medical History   Diagnosis Date   ??? Hand eczema    ??? Diverticulitis    ??? Anxiety        Past Surgical History   Procedure Laterality Date   ??? Colonoscopy  12/31/07     DR FLEITES   ??? Tonsillectomy         family history includes Heart Disease in his mother; Liver Cancer in his father; and Prostate Cancer in his brother.    History     Social History   ??? Marital Status: Single     Spouse Name: N/A     Number of Children: N/A   ??? Years of Education: N/A     Occupational History   ??? Not on file.     Social History Main Topics   ??? Smoking status: Former Smoker -- 1.00 packs/day for 40 years     Quit date: 12/01/1995   ??? Smokeless tobacco: Never Used   ??? Alcohol Use:    ??? Drug Use:    ??? Sexually Active:      Other Topics Concern   ??? Not on file     Social History Narrative   ??? No narrative on file       No Known Allergies    Review of Systems - General ROS: negative  Psychological ROS: negative  ENT ROS: negative  Hematological and Lymphatic ROS: negative  Endocrine ROS: negative  Respiratory ROS: no cough, shortness of breath, or wheezing  Cardiovascular ROS: no chest pain or dyspnea on exertion  Gastrointestinal ROS: no abdominal pain, change in bowel habits, or black or bloody stools  Genito-Urinary ROS: no dysuria, trouble voiding, or hematuria  Musculoskeletal ROS: negative  Neurological ROS: no TIA or stroke symptoms  Dermatological ROS: negative    Blood pressure  132/70, pulse 74, temperature 98.6 ??F (37 ??C), temperature source Oral, resp. rate 12, height 5\' 11"  (1.803 m), weight 254 lb (115.214 kg).    Physical Examination: General appearance - alert, well appearing, and in no distress  Mental status - alert, oriented to person, place, and time  Eyes - pupils equal and reactive, extraocular eye movements intact  Ears - bilateral TM's and external ear canals normal  Mouth - mucous membranes moist, pharynx normal without lesions  Neck - supple, no significant adenopathy  Lymphatics - no palpable lymphadenopathy, no hepatosplenomegaly  Chest - clear to auscultation, no wheezes, rales or rhonchi, symmetric air entry  Heart - normal rate, regular rhythm, normal S1, S2, no murmurs, rubs, clicks or gallops  Abdomen - soft, nontender, nondistended, no masses or organomegaly  Neurological - alert, oriented, normal speech, no focal findings or movement disorder noted  Musculoskeletal - no joint tenderness, deformity or swelling  Extremities - peripheral pulses normal, no pedal edema, no clubbing  or cyanosis  Skin - normal coloration and turgor, no rashes, no suspicious skin lesions noted;    Assessment:    1. HTN (hypertension)  Comprehensive metabolic panel, Lipid panel   2. Osteoarthrosis, unspecified whether generalized or localized, unspecified site  Comprehensive metabolic panel, Lipid panel   3. Depressed  Comprehensive metabolic panel, Lipid panel   4. Dermatitis  Comprehensive metabolic panel, Lipid panel   5. Other and unspecified hyperlipidemia  Comprehensive metabolic panel, Lipid panel   6. Prostate cancer screening  Psa screening       Plan:    Orders Placed This Encounter   Procedures   ??? Comprehensive metabolic panel   ??? Lipid panel     Order Specific Question:  Is Patient Fasting?/# of Hours     Answer:  12   ??? Psa screening       Outpatient Encounter Prescriptions as of 01/01/2013   Medication Sig Dispense Refill   ??? mometasone (ELOCON) 0.1 % cream Apply topically  daily.  45 g  3   ??? lovastatin (MEVACOR) 20 MG tablet Take 2 tablets by mouth nightly.  180 tablet  0   ??? lisinopril (PRINIVIL;ZESTRIL) 20 MG tablet Take 1 tablet by mouth daily.  90 tablet  3   ??? naproxen (NAPROSYN) 500 MG tablet Take 1 tablet by mouth 2 times daily (with meals).  180 tablet  3   ??? FLUoxetine (PROZAC) 40 MG capsule Take 1 capsule by mouth daily.  90 capsule  3   ??? [DISCONTINUED] lovastatin (MEVACOR) 20 MG tablet TAKE TWO TABLETS (40 MG) BY MOUTH NIGHTLY  180 tablet  0   ??? lovastatin (MEVACOR) 40 MG tablet Take 1 tablet by mouth nightly.  90 tablet  3   ??? [DISCONTINUED] lisinopril (PRINIVIL;ZESTRIL) 20 MG tablet Take 1 tablet by mouth daily.  90 tablet  3   ??? [DISCONTINUED] naproxen (NAPROSYN) 500 MG tablet Take 1 tablet by mouth 2 times daily (with meals).  90 tablet  3   ??? [DISCONTINUED] FLUoxetine (PROZAC) 40 MG capsule Take 1 capsule by mouth daily.  90 capsule  3   ??? [DISCONTINUED] mometasone (ELOCON) 0.1 % cream Apply topically daily.  45 g  3     No facility-administered encounter medications on file as of 01/01/2013.     Recommend lifestyle modification.      Return in about 6 months (around 07/01/2013).    Patient education provided. They understand and agree with this course of treatment. They will return with new or worsening symptoms. Patient instructed to remain current with appropriate annual health maintenance.

## 2013-01-02 LAB — COMPREHENSIVE METABOLIC PANEL
ALT: 22 U/L (ref 0–41)
AST: 27 U/L (ref 0–40)
Albumin: 4.1 g/dL (ref 3.9–4.9)
Alkaline Phosphatase: 102 U/L (ref 40–130)
Anion Gap: 11 mEq/L (ref 7–13)
BUN: 17 mg/dL (ref 8–23)
CO2: 28 mEq/L (ref 22–29)
Calcium: 9.3 mg/dL (ref 8.6–10.2)
Chloride: 97 mEq/L — ABNORMAL LOW (ref 98–107)
Creatinine: 0.91 mg/dL (ref 0.70–1.20)
GFR African American: >60 (ref >60)
GFR Non-African American: >60 (ref >60)
Globulin: 2.9 g/dL (ref 2.3–3.5)
Glucose: 89 mg/dL (ref 74–109)
Potassium: 4.4 mEq/L (ref 3.5–5.1)
Sodium: 136 mEq/L (ref 136–145)
Total Bilirubin: 0.6 mg/dL (ref 0.0–1.2)
Total Protein: 7 g/dL (ref 6.4–8.3)

## 2013-01-02 LAB — LIPID PANEL
Cholesterol, Total: 157 mg/dL (ref 0–199)
HDL: 46 mg/dL
LDL Calculated: 91 mg/dL (ref 0–129)
Triglycerides: 99 mg/dL (ref 0–200)

## 2013-01-02 LAB — PSA SCREENING: PSA: 0.34 ng/mL (ref 0.00–6.22)

## 2013-03-19 NOTE — Telephone Encounter (Signed)
SEE PT'S MYCHART MESSAGE.  PLEASE RELEASE PT'S LABS TO MYCHART.

## 2013-03-19 NOTE — Telephone Encounter (Signed)
From: [place]  To: Letta Median, MD  Sent: 03/19/2013 10:58 AM EDT  Subject: Test Results Question    I would like the results of my last blood test that I had in January 2014 .  Thank you.  Erik Warner

## 2013-03-20 NOTE — Telephone Encounter (Signed)
NK release the results to mychart through your epic button mychart utilities I am unable to.

## 2013-03-23 NOTE — Telephone Encounter (Signed)
Please release to my chart. MA unable to

## 2013-04-01 NOTE — Telephone Encounter (Signed)
From: Erik Warner  To: Letta Median, MD  Sent: 03/29/2013 7:31 AM EDT  Subject: Test Results Question    Please send me the results of my last blood test. Apparently, your staff did not follow up on my first request or another [name] got my results. Thank you.

## 2013-04-01 NOTE — Telephone Encounter (Signed)
Please release to my chart

## 2013-06-23 NOTE — Telephone Encounter (Signed)
Pt requesting refill of generic Mevacor 20 mg 2 nightly  90 day supply with refills.    He just cancelled his appt on 07/02/13 where you are out of the office.

## 2013-06-25 NOTE — Telephone Encounter (Signed)
PATIENT LAST SEEN 01/01/2013. LAST REFILL 01/01/2013. RX PENDING, PLEASE APPROVE OR DENY.

## 2013-06-25 NOTE — Telephone Encounter (Signed)
Call- done tonite

## 2013-06-26 MED ORDER — LOVASTATIN 20 MG PO TABS
20 MG | ORAL_TABLET | Freq: Every evening | ORAL | Status: DC
Start: 2013-06-26 — End: 2014-02-25

## 2013-06-26 MED ORDER — LOVASTATIN 20 MG PO TABS
20 MG | ORAL_TABLET | Freq: Every evening | ORAL | Status: DC
Start: 2013-06-26 — End: 2013-06-26

## 2013-06-26 NOTE — Telephone Encounter (Signed)
Pt requests refill on medication. Please approve or deny this request.  Last seen by you on 01/01/2013.

## 2013-06-29 MED ORDER — LOVASTATIN 40 MG PO TABS
40 MG | ORAL_TABLET | Freq: Every evening | ORAL | Status: DC
Start: 2013-06-29 — End: 2014-02-25

## 2014-01-05 ENCOUNTER — Ambulatory Visit (HOSPITAL_BASED_OUTPATIENT_CLINIC_OR_DEPARTMENT_OTHER): Payer: Medicare Other | Attending: Physician Assistant | Admitting: Radiology

## 2014-01-05 VITALS — Ht 66.0 in | Wt 141.0 lb

## 2014-01-05 DIAGNOSIS — Z950 Presence of cardiac pacemaker: Secondary | ICD-10-CM | POA: Insufficient documentation

## 2014-01-05 DIAGNOSIS — IMO0002 Reserved for concepts with insufficient information to code with codable children: Secondary | ICD-10-CM | POA: Insufficient documentation

## 2014-01-05 DIAGNOSIS — Z9989 Dependence on other enabling machines and devices: Secondary | ICD-10-CM

## 2014-01-05 DIAGNOSIS — R0609 Other forms of dyspnea: Secondary | ICD-10-CM | POA: Insufficient documentation

## 2014-01-05 DIAGNOSIS — G4733 Obstructive sleep apnea (adult) (pediatric): Secondary | ICD-10-CM

## 2014-01-05 DIAGNOSIS — R0989 Other specified symptoms and signs involving the circulatory and respiratory systems: Secondary | ICD-10-CM | POA: Insufficient documentation

## 2014-01-05 DIAGNOSIS — I491 Atrial premature depolarization: Secondary | ICD-10-CM | POA: Insufficient documentation

## 2014-01-09 NOTE — Sleep Study (Signed)
   NAME: Steven Duffy DATE OF BIRTH:  01-30-1941 MEDICAL RECORD NUMBER [id number]  LOCATION: [facility]  PHYSICIAN: Flor Houdeshell D  DATE OF STUDY: 01/05/2014  SLEEP STUDY TYPE: Nocturnal Polysomnogram               REFERRING PHYSICIAN: Elijio Miles, MD  INDICATION FOR STUDY: Hypersomnia with sleep apnea  EPWORTH SLEEPINESS SCORE:   15/24 HEIGHT: 5\' 6"  (167.6 cm)  WEIGHT: 141 lb (63.957 kg)    Body mass index is 22.77 kg/(m^2).  NECK SIZE: 15 in.  MEDICATIONS: Charted for review  SLEEP ARCHITECTURE: Total sleep time 275 minutes with sleep efficiency 65.5%. Stage I was 12.2%, stage II 77.8%, stage III absent, REM 10% of total sleep time. Sleep latency 14.5 minutes, REM latency 145.5 minutes, awake after sleep onset 130 minutes, arousal index of 7.2. Bedtime medication: Pilocarpine, Lumigan, Acetazolamide  RESPIRATORY DATA: Apnea hypopnea index (AHI) 10.7 per hour. 49 events were scored including 22 obstructive apneas and 27 hypopneas. Most events were associated with supine sleep position and occurred late in the night, after 3 AM. REM AHI 4.4 per hour. There were not enough early events to permit application of split protocol CPAP titration.  OXYGEN DATA: Mild to moderate snoring with oxygen desaturation to a nadir of 86% and mean oxygen saturation through the study of 96.1% on room air.  CARDIAC DATA: Regular rhythm with PACs. Patient has pacemaker  MOVEMENT/PARASOMNIA: No significant movement disturbance. 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 Most events develop after 3 AM, while lying supine.  2) Apneas primarily associated with supine sleep position might respond to management with encouragement to sleep off flat of back. The patient can return for dedicated CPAP titration study if appropriate.  Signed Baird Lyons M.D. Deneise Lever Diplomate, American Board of Sleep  Medicine   ELECTRONICALLY SIGNED ON:  01/09/2014, 12:59 PM [place] PH: [phone]   FX: [phone] [place]

## 2014-02-23 NOTE — Telephone Encounter (Signed)
PHARMACY REQUESTING REFILL PATIENT LAST SEEN 01/01/14   PLEASE APPROVE OR DENY.

## 2014-02-23 NOTE — Telephone Encounter (Signed)
LEFT MESSAGE ON MACHINE TO RETURN CALL.

## 2014-02-23 NOTE — Telephone Encounter (Signed)
PT AWARE. FOLLOW UP APPOINTMENT SCHEDULED PER IRMARIE.

## 2014-02-25 LAB — COMPREHENSIVE METABOLIC PANEL
ALT: 21 U/L (ref 0–41)
AST: 24 U/L (ref 0–40)
Albumin: 4 g/dL (ref 3.9–4.9)
Alkaline Phosphatase: 84 U/L (ref 35–104)
Anion Gap: 12 mEq/L (ref 7–13)
BUN: 17 mg/dL (ref 8–23)
CO2: 27 mEq/L (ref 22–29)
Calcium: 8.9 mg/dL (ref 8.6–10.2)
Chloride: 101 mEq/L (ref 98–107)
Creatinine: 0.79 mg/dL (ref 0.70–1.20)
GFR African American: >60 (ref >60)
GFR Non-African American: >60 (ref >60)
Globulin: 2.6 g/dL (ref 2.3–3.5)
Glucose: 98 mg/dL (ref 74–109)
Potassium: 4.6 mEq/L (ref 3.5–5.1)
Sodium: 140 mEq/L (ref 132–144)
Total Bilirubin: 0.4 mg/dL (ref 0.0–1.2)
Total Protein: 6.6 g/dL (ref 6.4–8.1)

## 2014-02-25 LAB — PSA SCREENING: PSA: 0.46 ng/mL (ref 0.00–6.22)

## 2014-02-25 MED ORDER — NAPROXEN 500 MG PO TABS
500 MG | ORAL_TABLET | Freq: Two times a day (BID) | ORAL | Status: DC
Start: 2014-02-25 — End: 2015-01-03

## 2014-02-25 MED ORDER — LISINOPRIL 20 MG PO TABS
20 MG | ORAL_TABLET | Freq: Every day | ORAL | Status: DC
Start: 2014-02-25 — End: 2015-01-03

## 2014-02-25 MED ORDER — FLUOXETINE HCL 40 MG PO CAPS
40 MG | ORAL_CAPSULE | Freq: Every day | ORAL | Status: DC
Start: 2014-02-25 — End: 2015-01-03

## 2014-02-25 MED ORDER — LOVASTATIN 20 MG PO TABS
20 MG | ORAL_TABLET | Freq: Every evening | ORAL | Status: DC
Start: 2014-02-25 — End: 2014-02-25

## 2014-02-25 MED ORDER — MOMETASONE FUROATE 0.1 % EX CREA
0.1 % | CUTANEOUS | Status: DC
Start: 2014-02-25 — End: 2015-01-03

## 2014-02-25 MED ORDER — LOVASTATIN 40 MG PO TABS
40 MG | ORAL_TABLET | Freq: Every evening | ORAL | Status: DC
Start: 2014-02-25 — End: 2015-01-03

## 2014-02-25 NOTE — Patient Instructions (Signed)
High Blood Pressure: After Your Visit  Your Care Instructions  If your blood pressure is usually above 140/90, you have high blood pressure, or hypertension. Despite what a lot of people think, high blood pressure usually doesn't cause headaches or make you feel dizzy or lightheaded. It usually has no symptoms. But it does increase your risk for heart attack, stroke, and kidney or eye damage. The higher your blood pressure, the more your risk increases.  Your doctor will give you a goal for your blood pressure. Your goal will be based on your health and your age. An example of a goal is to keep your blood pressure below 140/90.  Lifestyle changes, such as eating healthy and being active, are always important to help lower blood pressure. You might also take medicine to reach your blood pressure goal.  Follow-up care is a key part of your treatment and safety. Be sure to make and go to all appointments, and call your doctor if you are having problems. It's also a good idea to know your test results and keep a list of the medicines you take.  How can you care for yourself at home?  Medical treatment  ?? If you stop taking your medicine, your blood pressure will go back up. You may take one or more types of medicine to lower your blood pressure. Be safe with medicines. Take your medicine exactly as prescribed. Call your doctor if you think you are having a problem with your medicine.  ?? Your doctor may suggest that you take one low-dose aspirin (81 mg) a day. This can help reduce your risk of having a stroke or heart attack.  ?? See your doctor regularly. You may need to see the doctor more often at first or until your blood pressure comes down.  ?? If you are taking blood pressure medicine, talk to your doctor before you take decongestants or anti-inflammatory medicine, such as ibuprofen. Some of these medicines can raise blood pressure.  ?? Learn how to check your blood pressure at home.  Lifestyle changes  ?? Stay at a  healthy weight. This is especially important if you put on weight around the waist. Losing even 10 pounds can help you lower your blood pressure.  ?? If your doctor recommends it, get more exercise. Walking is a good choice. Bit by bit, increase the amount you walk every day. Try for at least 30 minutes on most days of the week. You also may want to swim, bike, or do other activities.  ?? Avoid or limit alcohol. Talk to your doctor about whether you can drink any alcohol.  ?? Try to limit how much sodium you eat to less than 2,300 milligrams (mg) a day. Your doctor may ask you to try to eat less than 1,500 mg a day.  ?? Eat plenty of fruits (such as bananas and oranges), vegetables, legumes, whole grains, and low-fat dairy products.  ?? Lower the amount of saturated fat in your diet. Saturated fat is found in animal products such as milk, cheese, and meat. Limiting these foods may help you lose weight and also lower your risk for heart disease.  ?? Do not smoke. Smoking increases your risk for heart attack and stroke. If you need help quitting, talk to your doctor about stop-smoking programs and medicines. These can increase your chances of quitting for good.  When should you call for help?  Call your doctor now or seek immediate medical care if:  ?? Your   blood pressure is much higher than normal (such as 180/110 or higher).  ?? You think high blood pressure is causing symptoms such as:  ?? Severe headache.  ?? Blurry vision.  Watch closely for changes in your health, and be sure to contact your doctor if:  ?? You do not get better as expected.   Where can you learn more?   Go to [email or web address] and sign in to your MyChart account. Enter X567 in the Search Health Information box to learn more about ???High Blood Pressure: After Your Visit.???    If you do not have an account, please click on the ???Sign Up Now??? link.     ?? 2006-2015 [employer]. Care instructions adapted under license by   Health. This care instruction is for use with your licensed healthcare professional. If you have questions about a medical condition or this instruction, always ask your healthcare professional. [employer], Inc[employer] disclaims any warranty or liability for your use of this information.  Content Version: 10.4.390249; Current as of: February 18, 2013

## 2014-02-25 NOTE — Progress Notes (Signed)
Chief Complaint   Patient presents with   ??? Hypertension   ??? Medication Refill   no cp/sob    Obese should lose weight    bp stable    oa stable    Patient presents for  exam.        Patient Active Problem List   Diagnosis   ??? Other and unspecified hyperlipidemia   ??? HTN (hypertension)   ??? Osteoarthrosis, unspecified whether generalized or localized, unspecified site   ??? Depressed   ??? Dermatitis   ??? Obesity, unspecified       has a current medication list which includes the following prescription(s): lovastatin, lovastatin, mometasone, lisinopril, naproxen, and fluoxetine.    Past Medical History   Diagnosis Date   ??? Hand eczema    ??? Diverticulitis    ??? Anxiety        Past Surgical History   Procedure Laterality Date   ??? Colonoscopy  12/31/07     DR FLEITES   ??? Tonsillectomy         family history includes Heart Disease in his mother; Liver Cancer in his father; Prostate Cancer in his brother.    History     Social History   ??? Marital Status: Married     Spouse Name: N/A     Number of Children: N/A   ??? Years of Education: N/A     Occupational History   ??? Not on file.     Social History Main Topics   ??? Smoking status: Former Smoker -- 1.00 packs/day for 40 years     Quit date: 12/01/1995   ??? Smokeless tobacco: Never Used   ??? Alcohol Use: Not on file   ??? Drug Use: Not on file   ??? Sexual Activity: Not on file     Other Topics Concern   ??? Not on file     Social History Narrative       No Known Allergies    Review of Systems - General ROS: negative  Psychological ROS: negative  ENT ROS: negative  Hematological and Lymphatic ROS: negative  Endocrine ROS: negative  Respiratory ROS: no cough, shortness of breath, or wheezing  Cardiovascular ROS: no chest pain or dyspnea on exertion  Gastrointestinal ROS: no abdominal pain, change in bowel habits, or black or bloody stools  Genito-Urinary ROS: no dysuria, trouble voiding, or hematuria  Musculoskeletal ROS: negative  Neurological ROS: no TIA or stroke symptoms  Dermatological  ROS: negative    Blood pressure 142/70, pulse 72, temperature 98 ??F (36.7 ??C), temperature source Temporal, resp. rate 14, height 5\' 11"  (1.803 m), weight 250 lb (113.399 kg).    Physical Examination: General appearance - alert, well appearing, and in no distress  Mental status - alert, oriented to person, place, and time  Eyes - pupils equal and reactive, extraocular eye movements intact  Ears - bilateral TM's and external ear canals normal  Mouth - mucous membranes moist, pharynx normal without lesions  Neck - supple, no significant adenopathy  Lymphatics - no palpable lymphadenopathy, no hepatosplenomegaly  Chest - clear to auscultation, no wheezes, rales or rhonchi, symmetric air entry  Heart - normal rate, regular rhythm, normal S1, S2, no murmurs, rubs, clicks or gallops  Abdomen - soft, nontender, nondistended, no masses or organomegaly  Neurological - alert, oriented, normal speech, no focal findings or movement disorder noted  Musculoskeletal - no joint tenderness, deformity or swelling  Extremities - peripheral pulses normal, no pedal edema,  no clubbing or cyanosis  Skin - normal coloration and turgor, no rashes, no suspicious skin lesions noted;    Assessment:    1. HTN (hypertension)  Lipid Panel    Comprehensive Metabolic Panel   2. Other and unspecified hyperlipidemia  Lipid Panel    Comprehensive Metabolic Panel   3. Depressed  Lipid Panel    Comprehensive Metabolic Panel   4. Dermatitis  Lipid Panel    Comprehensive Metabolic Panel   5. Osteoarthrosis, unspecified whether generalized or localized, unspecified site  Lipid Panel    Comprehensive Metabolic Panel   6. Obesity, unspecified  Lipid Panel    Comprehensive Metabolic Panel   7. Prostate cancer screening  Psa screening       Plan:    Orders Placed This Encounter   Procedures   ??? Lipid Panel     Order Specific Question:  Is Patient Fasting?/# of Hours     Answer:  12   ??? Comprehensive Metabolic Panel   ??? Psa screening       Outpatient Encounter  Prescriptions as of 02/25/2014   Medication Sig Dispense Refill   ??? lovastatin (MEVACOR) 40 MG tablet Take 1 tablet by mouth nightly.  90 tablet  3   ??? lovastatin (MEVACOR) 20 MG tablet Take 2 tablets by mouth nightly.  180 tablet  3   ??? mometasone (ELOCON) 0.1 % cream Apply topically daily.  45 g  3   ??? lisinopril (PRINIVIL;ZESTRIL) 20 MG tablet Take 1 tablet by mouth daily.  90 tablet  3   ??? naproxen (NAPROSYN) 500 MG tablet Take 1 tablet by mouth 2 times daily (with meals).  180 tablet  3   ??? FLUoxetine (PROZAC) 40 MG capsule Take 1 capsule by mouth daily.  90 capsule  3   ??? [DISCONTINUED] lovastatin (MEVACOR) 40 MG tablet Take 1 tablet by mouth nightly.  90 tablet  3   ??? [DISCONTINUED] lovastatin (MEVACOR) 20 MG tablet Take 2 tablets by mouth nightly.  180 tablet  3   ??? [DISCONTINUED] mometasone (ELOCON) 0.1 % cream Apply topically daily.  45 g  3   ??? [DISCONTINUED] lisinopril (PRINIVIL;ZESTRIL) 20 MG tablet Take 1 tablet by mouth daily.  90 tablet  3   ??? [DISCONTINUED] naproxen (NAPROSYN) 500 MG tablet Take 1 tablet by mouth 2 times daily (with meals).  180 tablet  3   ??? [DISCONTINUED] FLUoxetine (PROZAC) 40 MG capsule Take 1 capsule by mouth daily.  90 capsule  3     No facility-administered encounter medications on file as of 02/25/2014.     Recommend lifestyle modification.      Return in about 6 months (around 08/28/2014).    Patient education provided. They understand and agree with this course of treatment. They will return with new or worsening symptoms. Patient instructed to remain current with appropriate annual health maintenance.

## 2014-02-25 NOTE — Telephone Encounter (Signed)
40

## 2014-02-25 NOTE — Telephone Encounter (Signed)
PHARMACY CALLING STATES THAT YOU SENT OVER RX FOR BOTH MEVACOR 20 AND 40 TODAY-WHICH DOSE SHOULD IT BE?      Dm/cc

## 2014-02-25 NOTE — Telephone Encounter (Addendum)
pharmacy aware pt should be on 40 mg

## 2014-02-26 LAB — LIPID PANEL
Cholesterol, Total: 152 mg/dL (ref 0–199)
HDL: 51 mg/dL (ref 40–59)
LDL Calculated: 79 mg/dL (ref 0–129)
Triglycerides: 109 mg/dL (ref 0–200)

## 2015-01-03 ENCOUNTER — Ambulatory Visit: Admit: 2015-01-03 | Discharge: 2015-01-03 | Payer: MEDICARE | Attending: Family Medicine | Primary: Family Medicine

## 2015-01-03 DIAGNOSIS — I10 Essential (primary) hypertension: Secondary | ICD-10-CM

## 2015-01-03 MED ORDER — LOVASTATIN 40 MG PO TABS
40 MG | ORAL_TABLET | Freq: Every evening | ORAL | Status: DC
Start: 2015-01-03 — End: 2016-01-17

## 2015-01-03 MED ORDER — MOMETASONE FUROATE 0.1 % EX CREA
0.1 % | CUTANEOUS | Status: DC
Start: 2015-01-03 — End: 2016-01-17

## 2015-01-03 MED ORDER — FLUOXETINE HCL 40 MG PO CAPS
40 MG | ORAL_CAPSULE | Freq: Every day | ORAL | Status: DC
Start: 2015-01-03 — End: 2016-01-17

## 2015-01-03 MED ORDER — NAPROXEN 500 MG PO TABS
500 MG | ORAL_TABLET | Freq: Two times a day (BID) | ORAL | Status: DC
Start: 2015-01-03 — End: 2016-01-17

## 2015-01-03 MED ORDER — LISINOPRIL 20 MG PO TABS
20 MG | ORAL_TABLET | Freq: Every day | ORAL | Status: DC
Start: 2015-01-03 — End: 2016-01-17

## 2015-01-03 NOTE — Patient Instructions (Signed)
Low Sodium Diet (2,000 Milligram): Care Instructions  Your Care Instructions  Too much sodium causes your body to hold on to extra water. This can raise your blood pressure and force your heart and kidneys to work harder. In very serious cases, this could cause you to be put in the hospital. It might even be life-threatening. By limiting sodium, you will feel better and lower your risk of serious problems.  The most common source of sodium is salt. People get most of the salt in their diet from canned, prepared, and packaged foods. Fast food and restaurant meals also are very high in sodium. Your doctor will probably limit your sodium to less than 2,000 milligrams (mg) a day. This limit counts all the sodium in prepared and packaged foods and any salt you add to your food.  And try to further reduce how much sodium you eat to less than 1,500 mg a day if you are 51 or older, are black, or have high blood pressure, diabetes, or chronic kidney disease.  Follow-up care is a key part of your treatment and safety. Be sure to make and go to all appointments, and call your doctor if you are having problems. It's also a good idea to know your test results and keep a list of the medicines you take.  How can you care for yourself at home?  Read food labels  ?? Read labels on cans and food packages. The labels tell you how much sodium is in each serving. Make sure that you look at the serving size. If you eat more than the serving size, you have eaten more sodium.  ?? Food labels also tell you the Percent Daily Value for sodium. Choose products with low Percent Daily Values for sodium.  ?? Be aware that sodium can come in forms other than salt, including monosodium glutamate (MSG), sodium citrate, and sodium bicarbonate (baking soda). MSG is often added to Asian food. When you eat out, you can sometimes ask for food without MSG or added salt.  Buy low-sodium foods  ?? Buy foods that are labeled "unsalted" (no salt added),  "sodium-free" (less than 5 mg of sodium per serving), or "low-sodium" (less than 140 mg of sodium per serving). Foods labeled "reduced-sodium" and "light sodium" may still have too much sodium. Be sure to read the label to see how much sodium you are getting.  ?? Buy fresh vegetables, or frozen vegetables without added sauces. Buy low-sodium versions of canned vegetables, soups, and other canned goods.  Prepare low-sodium meals  ?? Cut back on the amount of salt you use in cooking. This will help you adjust to the taste. Do not add salt after cooking. One teaspoon of salt has about 2,300 mg of sodium.  ?? Take the salt shaker off the table.  ?? Flavor your food with garlic, lemon juice, onion, vinegar, herbs, and spices. Do not use soy sauce, lite soy sauce, steak sauce, onion salt, garlic salt, celery salt, mustard, or ketchup on your food.  ?? Use low-sodium salad dressings, sauces, and ketchup. Or make your own salad dressings and sauces without adding salt.  ?? Use less salt (or none) when recipes call for it. You can often use half the salt a recipe calls for without losing flavor. Other foods such as rice, pasta, and grains do not need added salt.  ?? Rinse canned vegetables, and cook them in fresh water. This removes some--but not all--of the salt.  ?? Avoid water that   is naturally high in sodium or that has been treated with water softeners, which add sodium. Call your local water company to find out the sodium content of your water supply. If you buy bottled water, read the label and choose a sodium-free brand.  Avoid high-sodium foods  ?? Avoid eating:  ?? Smoked, cured, salted, and canned meat, fish, and poultry.  ?? Ham, bacon, hot dogs, and luncheon meats.  ?? Regular, hard, and processed cheese and regular peanut butter.  ?? Crackers with salted tops, and other salted snack foods such as pretzels, chips, and salted popcorn.  ?? Frozen prepared meals, unless labeled low-sodium.  ?? Canned and dried soups, broths, and  bouillon, unless labeled sodium-free or low-sodium.  ?? Canned vegetables, unless labeled sodium-free or low-sodium.  ?? French fries, pizza, tacos, and other fast foods.  ?? Pickles, olives, ketchup, and other condiments, especially soy sauce, unless labeled sodium-free or low-sodium.   Where can you learn more?   Go to [email or web address] and sign in to your MyChart account. Enter V843 in the Search Health Information box to learn more about ???Low Sodium Diet (2,000 Milligram): Care Instructions.???    If you do not have an account, please click on the ???Sign Up Now??? link.     ?? 2006-2015 [employer]. Care instructions adapted under license by [facility]. This care instruction is for use with your licensed healthcare professional. If you have questions about a medical condition or this instruction, always ask your healthcare professional. Health[employer], Incorporated disclaims any warranty or liability for your use of this information.  Content Version: 10.6.465758; Current as of: January 22, 2014

## 2015-01-03 NOTE — Progress Notes (Signed)
Chief Complaint   Patient presents with   ??? Check-Up   ??? Medication Refill   no cp/sob  bp stable    Obese should lose weight    oa stable  Stable pain pattern    Mood stable  No suicidal/homocidal ideation. No psychotic or manic symptoms.    Skin stable  Wants cream rf    Hi chol  Watches diet    Patient presents for  exam.        Patient Active Problem List   Diagnosis   ??? Other and unspecified hyperlipidemia   ??? Depressed   ??? Dermatitis   ??? Essential hypertension   ??? Primary osteoarthritis involving multiple joints   ??? 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    Past Medical History   Diagnosis Date   ??? Hand eczema    ??? Diverticulitis    ??? Anxiety        Past Surgical History   Procedure Laterality Date   ??? Colonoscopy  12/31/07     DR FLEITES   ??? Tonsillectomy         family history includes Heart Disease in his mother; Liver Cancer in his father; Prostate Cancer in his brother.    History     Social History   ??? Marital Status: Married     Spouse Name: N/A     Number of Children: N/A   ??? Years of Education: N/A     Occupational History   ??? Not on file.     Social History Main Topics   ??? Smoking status: Former Smoker -- 1.00 packs/day for 40 years     Quit date: 12/01/1995   ??? Smokeless tobacco: Never Used   ??? Alcohol Use: Not on file   ??? Drug Use: Not on file   ??? Sexual Activity: Not on file     Other Topics Concern   ??? Not on file     Social History Narrative       No Known Allergies    Review of Systems - General ROS: negative  Psychological ROS: negative  ENT ROS: negative  Hematological and Lymphatic ROS: negative  Endocrine ROS: negative  Respiratory ROS: no cough, shortness of breath, or wheezing  Cardiovascular ROS: no chest pain or dyspnea on exertion  Gastrointestinal ROS: no abdominal pain, change in bowel habits, or black or bloody stools  Genito-Urinary ROS:  no dysuria, trouble voiding, or hematuria  Musculoskeletal ROS: negative  Neurological ROS: no TIA or stroke symptoms  Dermatological ROS: negative    Blood pressure 124/76, pulse 78, temperature 97.7 ??F (36.5 ??C), temperature source Temporal, resp. rate 16, height 5\' 11"  (1.803 m), weight 251 lb (113.853 kg).    Physical Examination: General appearance - alert, well appearing, and in no distress  Mental status - alert, oriented to person, place, and time  Eyes - pupils equal and reactive, extraocular eye movements intact  Ears - bilateral TM's and external ear canals normal  Mouth - mucous membranes moist, pharynx normal without lesions  Neck - supple, no significant adenopathy  Lymphatics - no palpable lymphadenopathy, no hepatosplenomegaly  Chest - clear to auscultation, no wheezes, rales or rhonchi, symmetric air entry  Heart - normal rate, regular rhythm, normal S1, S2, no murmurs, rubs, clicks or gallops  Abdomen - soft, nontender, nondistended, no masses  or organomegaly, obese  Neurological - alert, oriented, normal speech, no focal findings or movement disorder noted  Musculoskeletal - no joint tenderness, deformity or swelling  Extremities - peripheral pulses normal, no pedal edema, no clubbing or cyanosis  Skin - dermatitis, o/w normal coloration and turgor, no rashes, no suspicious skin lesions noted;    Assessment:    1. Essential hypertension     2. Primary osteoarthritis involving multiple joints     3. Depressed     4. Dermatitis     5. 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 Other and unspecified hyperlipidemia         Plan:    No orders of the defined types were placed in this encounter.       Outpatient Encounter Prescriptions as of 01/03/2015   Medication Sig Dispense Refill   ??? lovastatin (MEVACOR) 40 MG tablet Take 1 tablet by mouth nightly 90 tablet 3   ??? mometasone (ELOCON) 0.1 % cream Apply topically daily. 45 g 3   ??? lisinopril (PRINIVIL;ZESTRIL) 20 MG tablet Take 1 tablet by mouth  daily 90 tablet 3   ??? naproxen (NAPROSYN) 500 MG tablet Take 1 tablet by mouth 2 times daily (with meals) 180 tablet 3   ??? FLUoxetine (PROZAC) 40 MG capsule Take 1 capsule by mouth daily 90 capsule 3   ??? [DISCONTINUED] lovastatin (MEVACOR) 40 MG tablet Take 1 tablet by mouth nightly. 90 tablet 3   ??? [DISCONTINUED] mometasone (ELOCON) 0.1 % cream Apply topically daily. 45 g 3   ??? [DISCONTINUED] lisinopril (PRINIVIL;ZESTRIL) 20 MG tablet Take 1 tablet by mouth daily. 90 tablet 3   ??? [DISCONTINUED] naproxen (NAPROSYN) 500 MG tablet Take 1 tablet by mouth 2 times daily (with meals). 180 tablet 3   ??? [DISCONTINUED] FLUoxetine (PROZAC) 40 MG capsule Take 1 capsule by mouth daily. 90 capsule 3     No facility-administered encounter medications on file as of 01/03/2015.     Recommend lifestyle modification.      Return in about 3 months (around 04/04/2015).    Patient education provided. They understand and agree with this course of treatment. They will return with new or worsening symptoms. Patient instructed to remain current with appropriate annual health maintenance.

## 2015-04-05 ENCOUNTER — Ambulatory Visit: Admit: 2015-04-05 | Discharge: 2015-04-05 | Payer: MEDICARE | Attending: Family Medicine | Primary: Family Medicine

## 2015-04-05 DIAGNOSIS — E785 Hyperlipidemia, unspecified: Secondary | ICD-10-CM

## 2015-04-05 LAB — LIPID PANEL
Cholesterol, Total: 154 mg/dL (ref 0–199)
HDL: 54 mg/dL (ref 40–59)
LDL Calculated: 87 mg/dL (ref 0–129)
Triglycerides: 66 mg/dL (ref 0–200)

## 2015-04-05 LAB — COMPREHENSIVE METABOLIC PANEL
ALT: 22 U/L (ref 0–41)
AST: 28 U/L (ref 0–40)
Albumin: 3.7 g/dL — ABNORMAL LOW (ref 3.9–4.9)
Alkaline Phosphatase: 89 U/L (ref 35–104)
Anion Gap: 13 mEq/L (ref 7–13)
BUN: 19 mg/dL (ref 8–23)
CO2: 22 mEq/L (ref 22–29)
Calcium: 8.3 mg/dL — ABNORMAL LOW (ref 8.6–10.2)
Chloride: 104 mEq/L (ref 98–107)
Creatinine: 0.82 mg/dL (ref 0.70–1.20)
GFR African American: >60 (ref >60)
GFR Non-African American: >60 (ref >60)
Globulin: 2.8 g/dL (ref 2.3–3.5)
Glucose: 109 mg/dL (ref 74–109)
Potassium: 4.4 mEq/L (ref 3.5–5.1)
Sodium: 139 mEq/L (ref 132–144)
Total Bilirubin: 0.5 mg/dL (ref 0.0–1.2)
Total Protein: 6.5 g/dL (ref 6.4–8.1)

## 2015-04-05 LAB — PSA SCREENING: PSA: 0.29 ng/mL (ref 0.00–6.22)

## 2015-04-05 NOTE — Patient Instructions (Signed)
Low Sodium Diet (2,000 Milligram): Care Instructions  Your Care Instructions  Too much sodium causes your body to hold on to extra water. This can raise your blood pressure and force your heart and kidneys to work harder. In very serious cases, this could cause you to be put in the hospital. It might even be life-threatening. By limiting sodium, you will feel better and lower your risk of serious problems.  The most common source of sodium is salt. People get most of the salt in their diet from canned, prepared, and packaged foods. Fast food and restaurant meals also are very high in sodium. Your doctor will probably limit your sodium to less than 2,000 milligrams (mg) a day. This limit counts all the sodium in prepared and packaged foods and any salt you add to your food.  And try to further reduce how much sodium you eat to less than 1,500 mg a day if you are 51 or older, are black, or have high blood pressure, diabetes, or chronic kidney disease.  Follow-up care is a key part of your treatment and safety. Be sure to make and go to all appointments, and call your doctor if you are having problems. It's also a good idea to know your test results and keep a list of the medicines you take.  How can you care for yourself at home?  Read food labels  ?? Read labels on cans and food packages. The labels tell you how much sodium is in each serving. Make sure that you look at the serving size. If you eat more than the serving size, you have eaten more sodium.  ?? Food labels also tell you the Percent Daily Value for sodium. Choose products with low Percent Daily Values for sodium.  ?? Be aware that sodium can come in forms other than salt, including monosodium glutamate (MSG), sodium citrate, and sodium bicarbonate (baking soda). MSG is often added to Asian food. When you eat out, you can sometimes ask for food without MSG or added salt.  Buy low-sodium foods  ?? Buy foods that are labeled "unsalted" (no salt added),  "sodium-free" (less than 5 mg of sodium per serving), or "low-sodium" (less than 140 mg of sodium per serving). Foods labeled "reduced-sodium" and "light sodium" may still have too much sodium. Be sure to read the label to see how much sodium you are getting.  ?? Buy fresh vegetables, or frozen vegetables without added sauces. Buy low-sodium versions of canned vegetables, soups, and other canned goods.  Prepare low-sodium meals  ?? Cut back on the amount of salt you use in cooking. This will help you adjust to the taste. Do not add salt after cooking. One teaspoon of salt has about 2,300 mg of sodium.  ?? Take the salt shaker off the table.  ?? Flavor your food with garlic, lemon juice, onion, vinegar, herbs, and spices. Do not use soy sauce, lite soy sauce, steak sauce, onion salt, garlic salt, celery salt, mustard, or ketchup on your food.  ?? Use low-sodium salad dressings, sauces, and ketchup. Or make your own salad dressings and sauces without adding salt.  ?? Use less salt (or none) when recipes call for it. You can often use half the salt a recipe calls for without losing flavor. Other foods such as rice, pasta, and grains do not need added salt.  ?? Rinse canned vegetables, and cook them in fresh water. This removes some--but not all--of the salt.  ?? Avoid water that   is naturally high in sodium or that has been treated with water softeners, which add sodium. Call your local water company to find out the sodium content of your water supply. If you buy bottled water, read the label and choose a sodium-free brand.  Avoid high-sodium foods  ?? Avoid eating:  ?? Smoked, cured, salted, and canned meat, fish, and poultry.  ?? Ham, bacon, hot dogs, and luncheon meats.  ?? Regular, hard, and processed cheese and regular peanut butter.  ?? Crackers with salted tops, and other salted snack foods such as pretzels, chips, and salted popcorn.  ?? Frozen prepared meals, unless labeled low-sodium.  ?? Canned and dried soups, broths, and  bouillon, unless labeled sodium-free or low-sodium.  ?? Canned vegetables, unless labeled sodium-free or low-sodium.  ?? French fries, pizza, tacos, and other fast foods.  ?? Pickles, olives, ketchup, and other condiments, especially soy sauce, unless labeled sodium-free or low-sodium.   Where can you learn more?   Go to [email or web address] and sign in to your MyChart account. Enter V843 in the Search Health Information box to learn more about ???Low Sodium Diet (2,000 Milligram): Care Instructions.???    If you do not have an account, please click on the ???Sign Up Now??? link.     ?? 2006-2015 [employer], [employer]. Care instructions adapted under license by [facility]. This care instruction is for use with your licensed healthcare professional. If you have questions about a medical condition or this instruction, always ask your healthcare professional. [employer] disclaims any warranty or liability for your use of this information.  Content Version: 10.6.465758; Current as of: January 22, 2014

## 2015-04-05 NOTE — Progress Notes (Signed)
Chief Complaint   Patient presents with   ??? Hypertension     3 mo f/u   no cp/sob  bp stable    Obese should lose weight    oa stable  Stable pain pattern    Mood stable  No suicidal/homocidal ideation. No psychotic or manic symptoms.    Hi chol  Watches diet    Patient presents for  exam.        Patient Active Problem List   Diagnosis   ??? Other and unspecified hyperlipidemia   ??? Depressed   ??? Dermatitis   ??? Essential hypertension   ??? Primary osteoarthritis involving multiple joints   ??? 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    Past Medical History   Diagnosis Date   ??? Hand eczema    ??? Diverticulitis    ??? Anxiety        Past Surgical History   Procedure Laterality Date   ??? Colonoscopy  12/31/07     DR FLEITES   ??? Tonsillectomy         family history includes Heart Disease in his mother; Liver Cancer in his father; Prostate Cancer in his brother.    History     Social History   ??? Marital Status: Married     Spouse Name: N/A     Number of Children: N/A   ??? Years of Education: N/A     Occupational History   ??? Not on file.     Social History Main Topics   ??? Smoking status: Former Smoker -- 1.00 packs/day for 40 years     Quit date: 12/01/1995   ??? Smokeless tobacco: Never Used   ??? Alcohol Use: Not on file   ??? Drug Use: Not on file   ??? Sexual Activity: Not on file     Other Topics Concern   ??? Not on file     Social History Narrative       No Known Allergies    Review of Systems - General ROS: negative  Psychological ROS: negative  ENT ROS: negative  Hematological and Lymphatic ROS: negative  Endocrine ROS: negative  Respiratory ROS: no cough, shortness of breath, or wheezing  Cardiovascular ROS: no chest pain or dyspnea on exertion  Gastrointestinal ROS: no abdominal pain, change in bowel habits, or black or bloody stools  Genito-Urinary ROS: no dysuria, trouble voiding, or  hematuria  Musculoskeletal ROS: negative  Neurological ROS: no TIA or stroke symptoms  Dermatological ROS: negative    Blood pressure 128/66, pulse 72, temperature 97.5 ??F (36.4 ??C), temperature source Temporal, resp. rate 16, height 5\' 11"  (1.803 m), weight 246 lb (111.585 kg).    Physical Examination: General appearance - alert, well appearing, and in no distress  Mental status - alert, oriented to person, place, and time  Eyes - pupils equal and reactive, extraocular eye movements intact  Ears - bilateral TM's and external ear canals normal  Mouth - mucous membranes moist, pharynx normal without lesions  Neck - supple, no significant adenopathy  Lymphatics - no palpable lymphadenopathy, no hepatosplenomegaly  Chest - clear to auscultation, no wheezes, rales or rhonchi, symmetric air entry  Heart - normal rate, regular rhythm, normal S1, S2, no murmurs, rubs, clicks or gallops  Abdomen - soft, nontender, nondistended, no masses or organomegaly, obese  Neurological - alert,  oriented, normal speech, no focal findings or movement disorder noted  Musculoskeletal - no joint tenderness, deformity or swelling  Extremities - peripheral pulses normal, no pedal edema, no clubbing or cyanosis  Skin - dermatitis, o/w normal coloration and turgor, no rashes, no suspicious skin lesions noted;    Assessment:    1. Other and unspecified hyperlipidemia  Comprehensive Metabolic Panel    Lipid Panel   2. Essential hypertension  Comprehensive Metabolic Panel    Lipid Panel   3. Primary osteoarthritis involving multiple joints  Comprehensive Metabolic Panel    Lipid Panel   4. 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 Prostate cancer screening  Psa screening       Plan:    Orders Placed This Encounter   Procedures   ??? Comprehensive Metabolic Panel   ??? Lipid Panel     Order Specific Question:  Is Patient Fasting?/# of Hours     Answer:  12   ??? Psa screening       Outpatient Encounter  Prescriptions as of 04/05/2015   Medication Sig Dispense Refill   ??? lovastatin (MEVACOR) 40 MG tablet Take 1 tablet by mouth nightly 90 tablet 3   ??? mometasone (ELOCON) 0.1 % cream Apply topically daily. 45 g 3   ??? lisinopril (PRINIVIL;ZESTRIL) 20 MG tablet Take 1 tablet by mouth daily 90 tablet 3   ??? naproxen (NAPROSYN) 500 MG tablet Take 1 tablet by mouth 2 times daily (with meals) 180 tablet 3   ??? FLUoxetine (PROZAC) 40 MG capsule Take 1 capsule by mouth daily 90 capsule 3     No facility-administered encounter medications on file as of 04/05/2015.     Recommend lifestyle modification.      Return in about 3 months (around 07/05/2015).    Patient education provided. They understand and agree with this course of treatment. They will return with new or worsening symptoms. Patient instructed to remain current with appropriate annual health maintenance.

## 2015-06-08 ENCOUNTER — Other Ambulatory Visit: Payer: Self-pay | Admitting: Urology

## 2015-06-23 ENCOUNTER — Encounter (HOSPITAL_COMMUNITY): Payer: Self-pay | Admitting: *Deleted

## 2015-06-28 NOTE — Progress Notes (Signed)
Faxed 06-23-15 for pacemaker orders. Second fax 06-27-15, and a phone call made on 06-28-15 to request the pacemaker orders.

## 2015-06-28 NOTE — Patient Instructions (Addendum)
JUNIEL GROENE  06/28/2015   Your procedure is scheduled on:  July 25,2016  [facility]  Entrance take [facility]  elevators to 3rd floor to  [place] at 8:45 AM.  Call this number if you have problems the morning of surgery [phone]   Remember: ONLY 1 PERSON MAY GO WITH YOU TO SHORT STAY TO GET  READY MORNING OF [place].  Do not eat food or drink liquids :After Midnight.               Bring mask and tubing from c-pap machine     Take these medicines the morning of surgery with A SIP OF WATER: Lanoxin, Trusopt eye drops, Lexapro, Protonix,  Metoprolol (Lopressor), Pilocarpine eye drops                               You may not have any metal on your body including hair pins and              piercings  Do not wear jewelry, , lotions, powders or perfumes, deodorant                       Men may shave face and neck.   Do not bring valuables to the hospital. [place].  Contacts, dentures or bridgework may not be worn into surgery.  Leave suitcase in the car. After surgery it may be brought to your room.     Patients discharged the day of surgery will not be allowed to drive home.  Name and phone number of your driver:  Special Instructions: coughing and deep breathing exercises, leg exercises              Please read over the following fact sheets you were given: _____________________________________________________________________             [facility] - Preparing for Surgery Before surgery, you can play an important role.  Because skin is not sterile, your skin needs to be as free of germs as possible.  You can reduce the number of germs on your skin by washing with CHG (chlorahexidine gluconate) soap before surgery.  CHG is an antiseptic cleaner which kills germs and bonds with the skin to continue killing germs even after washing. Please DO NOT use if you have an allergy to  CHG or antibacterial soaps.  If your skin becomes reddened/irritated stop using the CHG and inform your nurse when you arrive at Short Stay. Do not shave (including legs and underarms) for at least 48 hours prior to the first CHG shower.  You may shave your face/neck. Please follow these instructions carefully:  1.  Shower with CHG Soap the night before surgery and the  morning of Surgery.  2.  If you choose to wash your hair, wash your hair first as usual with your  normal  shampoo.  3.  After you shampoo, rinse your hair and body thoroughly to remove the  shampoo.                           4.  Use CHG as you would any other liquid soap.  You can apply  chg directly  to the skin and wash                       Gently with a scrungie or clean washcloth.  5.  Apply the CHG Soap to your body ONLY FROM THE NECK DOWN.   Do not use on face/ open                           Wound or open sores. Avoid contact with eyes, ears mouth and genitals (private parts).                       Wash face,  Genitals (private parts) with your normal soap.             6.  Wash thoroughly, paying special attention to the area where your surgery  will be performed.  7.  Thoroughly rinse your body with warm water from the neck down.  8.  DO NOT shower/wash with your normal soap after using and rinsing off  the CHG Soap.                9.  Pat yourself dry with a clean towel.            10.  Wear clean pajamas.            11.  Place clean sheets on your bed the night of your first shower and do not  sleep with pets. Day of Surgery : Do not apply any lotions/deodorants the morning of surgery.  Please wear clean clothes to the hospital/surgery center.  FAILURE TO FOLLOW THESE INSTRUCTIONS MAY RESULT IN THE CANCELLATION OF YOUR SURGERY PATIENT SIGNATURE_________________________________  NURSE SIGNATURE__________________________________  ________________________________________________________________________

## 2015-06-29 ENCOUNTER — Other Ambulatory Visit (HOSPITAL_COMMUNITY): Payer: Medicare Other

## 2015-06-29 ENCOUNTER — Encounter (HOSPITAL_COMMUNITY): Payer: Self-pay

## 2015-06-29 ENCOUNTER — Encounter (HOSPITAL_COMMUNITY)
Admission: RE | Admit: 2015-06-29 | Discharge: 2015-06-29 | Disposition: A | Discharge status: Home / Self Care | Payer: Medicare (Managed Care) | Source: Ambulatory Visit | Attending: Urology | Admitting: Urology

## 2015-06-29 DIAGNOSIS — N200 Calculus of kidney: Secondary | ICD-10-CM | POA: Diagnosis not present

## 2015-06-29 DIAGNOSIS — Z01812 Encounter for preprocedural laboratory examination: Secondary | ICD-10-CM | POA: Insufficient documentation

## 2015-06-29 HISTORY — DX: Pneumonia, unspecified organism: J18.9

## 2015-06-29 HISTORY — DX: Anemia, unspecified: D64.9

## 2015-06-29 LAB — BASIC METABOLIC PANEL
Anion gap: 8 (ref 5–15)
BUN: 17 mg/dL (ref 6–20)
CO2: 22 mmol/L (ref 22–32)
CREATININE: 1.06 mg/dL (ref 0.61–1.24)
Calcium: 9.3 mg/dL (ref 8.9–10.3)
Chloride: 113 mmol/L — ABNORMAL HIGH (ref 101–111)
GFR calc Af Amer: >60 mL/min (ref >60)
GFR calc non Af Amer: >60 mL/min (ref >60)
Glucose, Bld: 148 mg/dL — ABNORMAL HIGH (ref 65–99)
Potassium: 3.6 mmol/L (ref 3.5–5.1)
SODIUM: 143 mmol/L (ref 135–145)

## 2015-06-29 LAB — CBC
HCT: 33.3 % — ABNORMAL LOW (ref 39.0–52.0)
HEMOGLOBIN: 9.8 g/dL — AB (ref 13.0–17.0)
MCH: 26.6 pg (ref 26.0–34.0)
MCHC: 29.4 g/dL — AB (ref 30.0–36.0)
MCV: 90.5 fL (ref 78.0–100.0)
Platelets: 219 10*3/uL (ref 150–400)
RBC: 3.68 MIL/uL — ABNORMAL LOW (ref 4.22–5.81)
RDW: 15.1 % (ref 11.5–15.5)
WBC: 6.5 10*3/uL (ref 4.0–10.5)

## 2015-06-29 NOTE — Progress Notes (Signed)
06-29-15- called patients wife Hoyle Sauer) and advised her that she needs to bring in the Power of [place] so she can sign surgical consent for her husband, when he comes  for  surgery on 07-04-15.

## 2015-06-29 NOTE — Progress Notes (Addendum)
7-20-16Kenney Houseman called [facility] and talked to Jeani Hawking [phone]) regarding 3 requests already made for pacemaker  Order .  Jeani Hawking said to fax request again to her and she will take care of it.   06-29-15 - Received pacemaker order.  In chart.

## 2015-06-29 NOTE — Progress Notes (Addendum)
02-23-15 - LOV- Dr. Bradd Burner (PCP)-in chart 02-09-15- EKG - in chart 12-28-14 - LOV - Dr. Suzy Bouchard (internal med) in chart 02-12-14 -LOV Dr. Wyline Copas (cardio) in chart 01-05-14- Sleep Study - in chart 11-19-14 LOV - Elijio Miles, PA-C,(pulmonary)- in chart

## 2015-06-29 NOTE — Progress Notes (Signed)
06-29-15 - Lab result (CBC) from preop visit on 06-29-15 faxed to Dr. Karsten Ro via [facility]

## 2015-07-04 ENCOUNTER — Ambulatory Visit (HOSPITAL_COMMUNITY): Payer: Medicare (Managed Care)

## 2015-07-04 ENCOUNTER — Ambulatory Visit (HOSPITAL_COMMUNITY)
Admission: RE | Admit: 2015-07-04 | Discharge: 2015-07-04 | Disposition: A | Discharge status: Home / Self Care | Payer: Medicare (Managed Care) | Source: Ambulatory Visit | Attending: Urology | Admitting: Urology

## 2015-07-04 ENCOUNTER — Encounter (HOSPITAL_COMMUNITY)
Admission: RE | Disposition: A | Discharge status: Home / Self Care | Payer: Self-pay | Source: Ambulatory Visit | Attending: Urology

## 2015-07-04 ENCOUNTER — Ambulatory Visit (HOSPITAL_COMMUNITY): Payer: Medicare (Managed Care) | Admitting: Certified Registered Nurse Anesthetist

## 2015-07-04 ENCOUNTER — Encounter (HOSPITAL_COMMUNITY): Payer: Self-pay | Admitting: *Deleted

## 2015-07-04 DIAGNOSIS — I252 Old myocardial infarction: Secondary | ICD-10-CM | POA: Insufficient documentation

## 2015-07-04 DIAGNOSIS — N200 Calculus of kidney: Secondary | ICD-10-CM | POA: Insufficient documentation

## 2015-07-04 DIAGNOSIS — I10 Essential (primary) hypertension: Secondary | ICD-10-CM | POA: Insufficient documentation

## 2015-07-04 DIAGNOSIS — F329 Major depressive disorder, single episode, unspecified: Secondary | ICD-10-CM | POA: Insufficient documentation

## 2015-07-04 DIAGNOSIS — Z950 Presence of cardiac pacemaker: Secondary | ICD-10-CM | POA: Insufficient documentation

## 2015-07-04 DIAGNOSIS — Z79899 Other long term (current) drug therapy: Secondary | ICD-10-CM | POA: Insufficient documentation

## 2015-07-04 DIAGNOSIS — M1990 Unspecified osteoarthritis, unspecified site: Secondary | ICD-10-CM | POA: Insufficient documentation

## 2015-07-04 DIAGNOSIS — N289 Disorder of kidney and ureter, unspecified: Secondary | ICD-10-CM | POA: Diagnosis not present

## 2015-07-04 DIAGNOSIS — Z87891 Personal history of nicotine dependence: Secondary | ICD-10-CM | POA: Insufficient documentation

## 2015-07-04 DIAGNOSIS — N210 Calculus in bladder: Secondary | ICD-10-CM | POA: Insufficient documentation

## 2015-07-04 DIAGNOSIS — F419 Anxiety disorder, unspecified: Secondary | ICD-10-CM | POA: Diagnosis not present

## 2015-07-04 DIAGNOSIS — I4891 Unspecified atrial fibrillation: Secondary | ICD-10-CM | POA: Diagnosis not present

## 2015-07-04 DIAGNOSIS — K219 Gastro-esophageal reflux disease without esophagitis: Secondary | ICD-10-CM | POA: Insufficient documentation

## 2015-07-04 DIAGNOSIS — E119 Type 2 diabetes mellitus without complications: Secondary | ICD-10-CM | POA: Diagnosis not present

## 2015-07-04 DIAGNOSIS — Z7902 Long term (current) use of antithrombotics/antiplatelets: Secondary | ICD-10-CM | POA: Insufficient documentation

## 2015-07-04 DIAGNOSIS — G4730 Sleep apnea, unspecified: Secondary | ICD-10-CM | POA: Diagnosis not present

## 2015-07-04 DIAGNOSIS — Z8673 Personal history of transient ischemic attack (TIA), and cerebral infarction without residual deficits: Secondary | ICD-10-CM | POA: Insufficient documentation

## 2015-07-04 DIAGNOSIS — Q625 Duplication of ureter: Secondary | ICD-10-CM | POA: Insufficient documentation

## 2015-07-04 DIAGNOSIS — M109 Gout, unspecified: Secondary | ICD-10-CM | POA: Diagnosis not present

## 2015-07-04 DIAGNOSIS — H409 Unspecified glaucoma: Secondary | ICD-10-CM | POA: Insufficient documentation

## 2015-07-04 DIAGNOSIS — N400 Enlarged prostate without lower urinary tract symptoms: Secondary | ICD-10-CM | POA: Insufficient documentation

## 2015-07-04 DIAGNOSIS — Z79891 Long term (current) use of opiate analgesic: Secondary | ICD-10-CM | POA: Diagnosis not present

## 2015-07-04 DIAGNOSIS — N281 Cyst of kidney, acquired: Secondary | ICD-10-CM | POA: Diagnosis not present

## 2015-07-04 DIAGNOSIS — I2510 Atherosclerotic heart disease of native coronary artery without angina pectoris: Secondary | ICD-10-CM | POA: Insufficient documentation

## 2015-07-04 HISTORY — DX: Unspecified osteoarthritis, unspecified site: M19.90

## 2015-07-04 HISTORY — DX: Major depressive disorder, single episode, unspecified: F32.9

## 2015-07-04 HISTORY — DX: Alcohol dependence, in remission: F10.21

## 2015-07-04 HISTORY — DX: Polyneuropathy, unspecified: G62.9

## 2015-07-04 HISTORY — DX: Presence of cardiac pacemaker: Z95.0

## 2015-07-04 HISTORY — PX: HOLMIUM LASER APPLICATION: SHX5852

## 2015-07-04 HISTORY — DX: Hematuria, unspecified: R31.9

## 2015-07-04 HISTORY — DX: Polyp of colon: K63.5

## 2015-07-04 HISTORY — DX: Nontraumatic intracranial hemorrhage, unspecified: I62.9

## 2015-07-04 HISTORY — DX: Sleep apnea, unspecified: G47.30

## 2015-07-04 HISTORY — DX: Dermatitis, unspecified: L30.9

## 2015-07-04 HISTORY — DX: Benign prostatic hyperplasia with lower urinary tract symptoms: N13.8

## 2015-07-04 HISTORY — DX: Depression, unspecified: F32.A

## 2015-07-04 HISTORY — DX: Gastro-esophageal reflux disease without esophagitis: K21.9

## 2015-07-04 HISTORY — DX: Bradycardia, unspecified: R00.1

## 2015-07-04 HISTORY — DX: Radiculopathy, cervical region: M54.12

## 2015-07-04 HISTORY — DX: Type 2 diabetes mellitus without complications: E11.9

## 2015-07-04 HISTORY — DX: Other specified symptoms and signs involving the circulatory and respiratory systems: R09.89

## 2015-07-04 HISTORY — DX: Unspecified atrial fibrillation: I48.91

## 2015-07-04 HISTORY — DX: Radiculopathy, lumbar region: M54.16

## 2015-07-04 HISTORY — DX: Ventricular tachycardia, unspecified: I47.20

## 2015-07-04 HISTORY — DX: Unspecified rotator cuff tear or rupture of unspecified shoulder, not specified as traumatic: M75.100

## 2015-07-04 HISTORY — PX: CYSTOSCOPY WITH RETROGRADE PYELOGRAM, URETEROSCOPY AND STENT PLACEMENT: SHX5789

## 2015-07-04 HISTORY — DX: Allergic rhinitis, unspecified: J30.9

## 2015-07-04 HISTORY — DX: Atherosclerotic heart disease of native coronary artery without angina pectoris: I25.10

## 2015-07-04 HISTORY — DX: Low back pain: M54.5

## 2015-07-04 HISTORY — DX: Other obstructive and reflux uropathy: N40.1

## 2015-07-04 HISTORY — DX: Acute pharyngitis, unspecified: J02.9

## 2015-07-04 HISTORY — DX: Cerebral infarction, unspecified: I63.9

## 2015-07-04 HISTORY — DX: Hyperlipidemia, unspecified: E78.5

## 2015-07-04 HISTORY — DX: Inflammatory disease of prostate, unspecified: N41.9

## 2015-07-04 HISTORY — DX: Unspecified visual loss: H54.7

## 2015-07-04 HISTORY — DX: Low back pain, unspecified: M54.50

## 2015-07-04 HISTORY — DX: Ventricular tachycardia: I47.2

## 2015-07-04 HISTORY — DX: Headache: R51

## 2015-07-04 HISTORY — DX: Essential (primary) hypertension: I10

## 2015-07-04 HISTORY — DX: Unspecified hemorrhoids: K64.9

## 2015-07-04 HISTORY — DX: Headache, unspecified: R51.9

## 2015-07-04 HISTORY — DX: Vitreous hemorrhage, unspecified eye: H43.10

## 2015-07-04 HISTORY — DX: Calculus of kidney: N20.0

## 2015-07-04 HISTORY — DX: Tremor, unspecified: R25.1

## 2015-07-04 LAB — GLUCOSE, CAPILLARY
Glucose-Capillary: 103 mg/dL — ABNORMAL HIGH (ref 65–99)
Glucose-Capillary: 97 mg/dL (ref 65–99)

## 2015-07-04 SURGERY — CYSTOURETEROSCOPY, WITH RETROGRADE PYELOGRAM AND STENT INSERTION
Anesthesia: General | Site: Ureter | Laterality: Right

## 2015-07-04 MED ORDER — TAMSULOSIN HCL 0.4 MG PO CAPS
0.4000 mg | ORAL_CAPSULE | Freq: Once | ORAL | Status: AC
  Administered 2015-07-04: 0.4 mg via ORAL
  Filled 2015-07-04: qty 1

## 2015-07-04 MED ORDER — SULFAMETHOXAZOLE-TRIMETHOPRIM 800-160 MG PO TABS
1.0000 | ORAL_TABLET | Freq: Once | ORAL | Status: AC
  Administered 2015-07-04: 1 via ORAL
  Filled 2015-07-04: qty 1

## 2015-07-04 MED ORDER — FENTANYL CITRATE (PF) 100 MCG/2ML IJ SOLN
25.0000 ug | INTRAMUSCULAR | Status: DC | PRN
  Administered 2015-07-04: 25 ug via INTRAVENOUS

## 2015-07-04 MED ORDER — LIDOCAINE HCL (CARDIAC) 20 MG/ML IV SOLN
INTRAVENOUS | Status: AC
  Filled 2015-07-04: qty 5

## 2015-07-04 MED ORDER — PROPOFOL 10 MG/ML IV BOLUS
INTRAVENOUS | Status: DC | PRN
  Administered 2015-07-04: 150 mg via INTRAVENOUS

## 2015-07-04 MED ORDER — LIDOCAINE HCL (CARDIAC) 20 MG/ML IV SOLN
INTRAVENOUS | Status: DC | PRN
  Administered 2015-07-04: 80 mg via INTRAVENOUS

## 2015-07-04 MED ORDER — CIPROFLOXACIN IN D5W 400 MG/200ML IV SOLN: 400.0000 mg | INTRAVENOUS | Status: DC

## 2015-07-04 MED ORDER — IOHEXOL 300 MG/ML  SOLN
INTRAMUSCULAR | Status: DC | PRN
  Administered 2015-07-04: 10 mL via URETHRAL

## 2015-07-04 MED ORDER — OXYCODONE HCL 10 MG PO TABS: 10.0000 mg | ORAL_TABLET | ORAL | Status: DC | PRN

## 2015-07-04 MED ORDER — LACTATED RINGERS IV SOLN
INTRAVENOUS | Status: DC
  Administered 2015-07-04: 1000 mL via INTRAVENOUS

## 2015-07-04 MED ORDER — PHENAZOPYRIDINE HCL 200 MG PO TABS: 200.0000 mg | ORAL_TABLET | Freq: Three times a day (TID) | ORAL | Status: DC | PRN

## 2015-07-04 MED ORDER — 0.9 % SODIUM CHLORIDE (POUR BTL) OPTIME
TOPICAL | Status: DC | PRN
  Administered 2015-07-04: 1000 mL

## 2015-07-04 MED ORDER — PHENAZOPYRIDINE HCL 200 MG PO TABS
ORAL_TABLET | ORAL | Status: AC
  Filled 2015-07-04: qty 1

## 2015-07-04 MED ORDER — FENTANYL CITRATE (PF) 250 MCG/5ML IJ SOLN
INTRAMUSCULAR | Status: AC
  Filled 2015-07-04: qty 5

## 2015-07-04 MED ORDER — FENTANYL CITRATE (PF) 100 MCG/2ML IJ SOLN
INTRAMUSCULAR | Status: AC
  Filled 2015-07-04: qty 2

## 2015-07-04 MED ORDER — PROMETHAZINE HCL 25 MG/ML IJ SOLN: 6.2500 mg | INTRAMUSCULAR | Status: DC | PRN

## 2015-07-04 MED ORDER — PHENAZOPYRIDINE HCL 200 MG PO TABS
200.0000 mg | ORAL_TABLET | Freq: Once | ORAL | Status: AC
  Administered 2015-07-04: 200 mg via ORAL

## 2015-07-04 MED ORDER — PROPOFOL 10 MG/ML IV BOLUS
INTRAVENOUS | Status: AC
  Filled 2015-07-04: qty 20

## 2015-07-04 MED ORDER — ONDANSETRON HCL 4 MG/2ML IJ SOLN
INTRAMUSCULAR | Status: AC
  Filled 2015-07-04: qty 2

## 2015-07-04 MED ORDER — FENTANYL CITRATE (PF) 100 MCG/2ML IJ SOLN
INTRAMUSCULAR | Status: DC | PRN
  Administered 2015-07-04: 50 ug via INTRAVENOUS

## 2015-07-04 MED ORDER — CIPROFLOXACIN IN D5W 400 MG/200ML IV SOLN
INTRAVENOUS | Status: AC
  Filled 2015-07-04: qty 200

## 2015-07-04 SURGICAL SUPPLY — 19 items
BAG URO CATCHER STRL LF (DRAPE) ×2 IMPLANT
BASKET ZERO TIP NITINOL 2.4FR (BASKET) ×2 IMPLANT
CATH INTERMIT  6FR 70CM (CATHETERS) ×2 IMPLANT
CLOTH BEACON ORANGE TIMEOUT ST (SAFETY) ×2 IMPLANT
FIBER LASER FLEXIVA 1000 (UROLOGICAL SUPPLIES) IMPLANT
FIBER LASER FLEXIVA 200 (UROLOGICAL SUPPLIES) ×2 IMPLANT
FIBER LASER FLEXIVA 365 (UROLOGICAL SUPPLIES) IMPLANT
FIBER LASER FLEXIVA 550 (UROLOGICAL SUPPLIES) IMPLANT
FIBER LASER TRAC TIP (UROLOGICAL SUPPLIES) ×2 IMPLANT
GLOVE BIOGEL M 8.0 STRL (GLOVE) ×2 IMPLANT
GOWN STRL REUS W/ TWL XL LVL3 (GOWN DISPOSABLE) IMPLANT
GOWN STRL REUS W/TWL XL LVL3 (GOWN DISPOSABLE) ×4 IMPLANT
GUIDEWIRE ANG ZIPWIRE 038X150 (WIRE) IMPLANT
GUIDEWIRE STR DUAL SENSOR (WIRE) ×2 IMPLANT
MANIFOLD NEPTUNE II (INSTRUMENTS) ×2 IMPLANT
PACK CYSTO (CUSTOM PROCEDURE TRAY) ×2 IMPLANT
SHEATH ACCESS URETERAL 38CM (SHEATH) ×2 IMPLANT
STENT URET 6FRX24 CONTOUR (STENTS) ×2 IMPLANT
TUBING CONNECTING 10 (TUBING) ×2 IMPLANT

## 2015-07-04 NOTE — Progress Notes (Signed)
Surgery delayed due to previous case runing over.  Patient transferred back to Short Stay to await surgical time.

## 2015-07-04 NOTE — Discharge Instructions (Signed)

## 2015-07-04 NOTE — H&P (Signed)
Steven Duffy is a 74 year old male   History of Present Illness     Bilateral renal calculi: A CT scan in 1/16 revealed bilateral renal calculi that were nonobstructing as well as a 7 mm stone which was the largest located in the right renal pelvis however the stone was never treated.  Serum studies - no significant abnormality  24-hour urine - significant hypocitraturia with a secondary risk factor of a low total volume of 1.23 L.   Treatment: calcium citrate 20 mEq b.i.d., magnesium supplementation (OTC) and increase his fluid intake.     Bilateral renal cysts. These were noted on CT scan in 1/16.    Complete right ureteral duplication. This was noted on his CT scan and confirmed cystoscopically by the finding of 2 right ureteral orifices.    Gross hematuria: He experienced gross hematuria in 2/16. Cystoscopic evaluation revealed no abnormality of the bladder other than BPH.    BPH: He was noted to have an enlarged prostate by cystoscopy.    Interval history: Because he had been continuing to have pain in the right flank region with gross hematuria and has known right renal calculi seen on his previous KUB obtained a CT scan.    IPSS 04/14/15: 15/Mixed   Past Medical History Problems  1. History of Anxiety (F41.9) 2. History of High cholesterol (E78.0) 3. History of acute myocardial infarction (I25.2) 4. History of arthritis (Z87.39) 5. History of atrial fibrillation (Z86.79) 6. History of depression (Z86.59) 7. History of diabetes mellitus (Z86.39) 8. History of esophageal reflux (Z87.19) 9. History of glaucoma (Z86.69) 10. History of gout (Z87.39) 11. History of hypertension (Z86.79) 12. History of sleep apnea (Z87.09) 13. History of stroke (U20.25)  Surgical History Problems  1. History of Eye Surgery 2. History of Pacemaker - Pulse Generator Replacement 3. History of Shoulder Surgery  Current Meds 1. AcetaZOLAMIDE ER 500 MG Oral Capsule Extended  Release 12 Hour;  Therapy: (Recorded:05May2016) to Recorded 2. Calcium Citrate 950 MG Oral Tablet; TAKE 2 TABLET Every twelve hours;  Therapy: [id number] to (Evaluate:27May2017); Last Rx:01Jun2016 Ordered 3. Cardizem CD 180 MG Oral Capsule Extended Release 24 Hour;  Therapy: (Recorded:05May2016) to Recorded 4. Finasteride 5 MG Oral Tablet;  Therapy: (Recorded:05May2016) to Recorded 5. Gabapentin 300 MG TABS;  Therapy: (Recorded:05May2016) to Recorded 6. Lanoxin 125 MCG Oral Tablet;  Therapy: (Recorded:05May2016) to Recorded 7. Lexapro 20 MG Oral Tablet;  Therapy: (Recorded:05May2016) to Recorded 8. Lumigan 0.01 % Ophthalmic Solution;  Therapy: (Recorded:05May2016) to Recorded 9. Magnesium 300 MG Oral Capsule; TAKE 1 CAPSULE DAILY;  Therapy: [id number] to (Evaluate:27May2017); Last Rx:01Jun2016 Ordered 10. MetFORMIN HCl - 1000 MG Oral Tablet;   Therapy: (Recorded:05May2016) to Recorded 11. Pilocar 1 % SOLN;   Therapy: (Recorded:05May2016) to Recorded 12. Pradaxa 150 MG Oral Capsule;   Therapy: (Recorded:05May2016) to Recorded 13. Tamsulosin HCl - 0.4 MG Oral Capsule;   Therapy: (Recorded:05May2016) to Recorded 14. Toprol XL 25 MG Oral Tablet Extended Release 24 Hour;   Therapy: (Recorded:05May2016) to Recorded 15. TraMADol HCl - 50 MG Oral Tablet;   Therapy: (Recorded:05May2016) to Recorded 16. Wellbutrin SR 150 MG Oral Tablet Extended Release 12 Hour;   Therapy: (Recorded:05May2016) to Recorded  Allergies Medication  1. duloxetine 2. Iodine SOLN 3. Penicillins 4. procaine Non-Medication  5. Adhesive Tape 6. Shellfish  Family History Problems  1. No pertinent family history : Mother  Social History Problems  1. Denied: History of Alcohol use 2. Denied: History of Caffeine use 3. Former smoker [phone])   1  ppdx20 years 4. Number of children   1 son and 3 daughters 5. Retired    Engineer, site Vital Signs  Height: 5 ft 7 in Weight: 162 lb  BMI Calculated:  25.37 BSA Calculated: 1.85 Blood Pressure: 93 / 53 Heart Rate: 59  Review of Systems Genitourinary, constitutional, skin, eye, otolaryngeal, hematologic/lymphatic, cardiovascular, pulmonary, endocrine, musculoskeletal, gastrointestinal, neurological and psychiatric system(s) were reviewed and pertinent findings if present are noted and are otherwise negative.  Genitourinary: urinary frequency, nocturia, difficulty starting the urinary stream, urinary stream starts and stops, hematuria, erectile dysfunction, penile pain and initiating urination requires straining.  Gastrointestinal: constipation.  Constitutional: fever, night sweats and feeling tired (fatigue).  Integumentary: skin rash/lesion and pruritus.  ENT: sore throat and sinus problems.  Hematologic/Lymphatic: a tendency to easily bruise.  Cardiovascular: leg swelling.  Respiratory: shortness of breath and cough.  Musculoskeletal: back pain.  Neurological: headache.  Psychiatric: anxiety and depression.     Physical Exam Constitutional: Well nourished and well developed . No acute distress.  ENT:. The ears and nose are normal in appearance.  Neck: The appearance of the neck is normal and no neck mass is present.  Pulmonary: No respiratory distress and normal respiratory rhythm and effort.  Cardiovascular: Heart rate and rhythm are normal . No peripheral edema.  Abdomen: The abdomen is soft and nontender. No masses are palpated. No CVA tenderness. No hernias are palpable. No hepatosplenomegaly noted.  Genitourinary: Examination of the penis demonstrates no discharge, no masses, no lesions and a normal meatus. The scrotum is without lesions. The right epididymis is palpably normal and non-tender. The left epididymis is palpably normal and non-tender. The right testis is non-tender and without masses. The left testis is non-tender and without masses.  Lymphatics: The femoral and inguinal nodes are not enlarged or tender.  Skin:  Normal skin turgor, no visible rash and no visible skin lesions.  Neuro/Psych:. Mood and affect are appropriate.   Results/Data  The following images/tracing/specimen were independently visualized:  CT scan as below.   The following radiology reports were reviewed: CT scan.    Assessment Assessed  1. Renal calculus, bilateral (N20.0)  I went over the results of his CT scan with him today. It has revealed a stone that was previously located in the area of the renal pelvis is now present in the lower pole. There are several stones in the right kidney that are stable in size and location however the stone in the lower pole is mobile within the kidney and appears to be causing intermittent pain and gross hematuria. He has 2 punctate stones in the left renal kidney of no clinical significance.    We discussed the management of urinary stones. These options include observation, ureteroscopy, shockwave lithotripsy, and PCNL. We discussed which options are relevant to these particular stones. We discussed the natural history of stones as well as the complications of untreated stones and the impact on quality of life without treatment as well as with each of the above listed treatments. We also discussed the efficacy of each treatment in its ability to clear the stone burden. With any of these management options I discussed the signs and symptoms of infection and the need for emergent treatment should these be experienced. For each option we discussed the ability of each procedure to clear the patient of their stone burden.    For observation I described the risks which include but are not limited to silent renal damage, life-threatening infection, need for emergent surgery,  failure to pass stone, and pain.    For ureteroscopy I described the risks which include heart attack, stroke, pulmonary embolus, death, bleeding, infection, damage to contiguous structures, positioning injury, ureteral  stricture, ureteral avulsion, ureteral injury, need for ureteral stent, inability to perform ureteroscopy, need for an interval procedure, inability to clear stone burden, stent discomfort and pain.    For shockwave lithotripsy I described the risks which include arrhythmia, kidney contusion, kidney hemorrhage, need for transfusion, long-term risk of diabetes or hypertension, back discomfort, flank ecchymosis, flank abrasion, inability to break up stone, inability to pass stone fragments, Steinstrasse, infection associated with obstructing stones, need for different surgical procedure and possible need for repeat shockwave lithotripsy.    For PCNL I described the risks including heart attack, sure, pulmonary embolus, death, positioning injury, pneumothorax, hydrothorax, need for chest tube, inability to clear stone burden, renal laceration, arterial venous fistula or malformation, need for embolization of kidney, loss of kidney or renal function, need for repeat procedure, need for prolonged nephrostomy tube, ureteral avulsion and fistula.     He has a pacemaker so lithotripsy would not be a good option and I think a percutaneous nephrolithotomy is a larger procedure than he needs to have. My recommendation is ureteroscopic management. He will need to come off his Pradaxa 5 days prior to the procedure. We will need to get clearance from his cardiologist to stop this medication although he indicates that he has been able to come off the medication for previous procedures.   Plan   1. He will need to come off of his Pradaxa  2. He will be scheduled for right ureteroscopy and laser lithotripsy of his right lower pole renal calculi.

## 2015-07-04 NOTE — Transfer of Care (Signed)
Immediate Anesthesia Transfer of Care Note  Patient: Steven Duffy  Procedure(s) Performed: Procedure(s): RIGHT RETROGRADE PYELOGRAM,  RIGHT URETEROSCOPY  (Right) HOLMIUM LASER LITHOTRIPSY AND STENT PLACEMENT  (Right)  Patient Location: PACU  Anesthesia Type:General  Level of Consciousness: awake, alert , oriented and patient cooperative  Airway & Oxygen Therapy: Patient Spontanous Breathing and Patient connected to face mask oxygen  Post-op Assessment: Report given to RN, Post -op Vital signs reviewed and stable and Patient moving all extremities  Post vital signs: Reviewed and stable  Last Vitals:  Filed Vitals:   07/04/15 0833  BP: 123/57  Pulse: 60  Temp: 36.3 C  Resp: 16    Complications: No apparent anesthesia complications

## 2015-07-04 NOTE — Op Note (Signed)
PATIENT:  Steven Duffy  PRE-OPERATIVE DIAGNOSIS:  right renal calculus  POST-OPERATIVE DIAGNOSIS: Same  PROCEDURE:  1. Cystoscopy with right retrograde pyelogram including interpretation. 2. Right ureteroscopy and laser lithotripsy 3. Right renal stone extraction. 4. Right double-J stent placement  SURGEON: Claybon Jabs, MD  INDICATION: Steven Duffy is a 75 year old male who has had a history of calculus disease and recently has developed intermittent right flank pain with gross hematuria. Evaluation revealed a stone located at the ureteropelvic junction of a lower pole moiety in a completely duplicated system. Follow-up imaging revealed the stone had fallen back into the lower pole. We discussed treating the stone and went over the various options. He has elected to proceed with ureteroscopy.  ANESTHESIA:  General  EBL:  Minimal  DRAINS: 6 French, 24 cm double-J stent in the right lower pole ureter (with string)  SPECIMEN:  Stone taken to my office for composition analysis  DESCRIPTION OF PROCEDURE: The patient was taken to the major OR and placed on the table. General anesthesia was administered and then the patient was moved to the dorsal lithotomy position. The genitalia was sterilely prepped and draped. An official timeout was performed.  Initially the 21 French cystoscope with 30 lens was passed under direct vision. The bladder was then entered and fully inspected. It was noted be free of any tumors or inflammatory lesions. There were a few small same grain sized calculi on the floor the bladder. Ureteral orifices were of normal configuration and position with 2 ureteral orifices noted on the right-hand side consistent with his known complete duplication. A 6 French open-ended ureteral catheter was then passed through the cystoscope into the ureteral orifice portending the lower pole in order to perform a right retrograde pyelogram.  A retrograde pyelogram was performed by  injecting full-strength contrast up the right lower pole ureter under direct fluoroscopic control. It revealed a filling defect in the lower pole calyx consistent with the stone seen on the preoperative KUB. The remainder of the ureter was noted to be normal as was the intrarenal collecting system. I then passed a 0.038 inch floppy-tipped guidewire through the open ended catheter and into the area of the renal pelvis and this was left in place. The ureteral access sheath was then passed over the guidewire into the area of the renal pelvis and the inner portion of the access sheath as well as guidewire were removed. I then proceeded with ureteroscopy.  A 6 French flexible, digital ureteroscope was then passed through the access sheath and into the lower pole of the right kidney. The stone was identified and I felt it was too large to extract and therefore elected to proceed with laser lithotripsy. The 200  holmium laser fiber was used to fragment the stone. I then used the nitinol basket to extract several large fragments and the remaining small fragments were then pulverized to sand grain sized pieces. The guidewire was then passed through the ureteroscope and left in place and the ureteroscope and access sheath were removed.   I then backloaded the cystoscope over the guidewire and passed the stent over the guidewire into the area of the lower pole renal pelvis. As the guidewire was removed good curl was noted in the renal pelvis. The bladder was drained and the cystoscope was then removed. The patient tolerated the procedure well no intraoperative complications.  PLAN OF CARE: Discharge to home after PACU  PATIENT DISPOSITION:  PACU - hemodynamically stable.

## 2015-07-04 NOTE — Anesthesia Preprocedure Evaluation (Signed)
Anesthesia Evaluation  Patient identified by MRN, date of birth, ID band Patient awake    Reviewed: Allergy & Precautions, NPO status , Patient's Chart, lab work & pertinent test results  Airway Mallampati: II  TM Distance: >3 FB Neck ROM: Full    Dental no notable dental hx.    Pulmonary sleep apnea , pneumonia -, former smoker,  breath sounds clear to auscultation  Pulmonary exam normal       Cardiovascular hypertension, Pt. on medications and Pt. on home beta blockers + CAD Normal cardiovascular exam+ pacemaker Rhythm:Regular Rate:Normal     Neuro/Psych  Headaches, PSYCHIATRIC DISORDERS Depression  Neuromuscular disease CVA    GI/Hepatic Neg liver ROS, GERD-  ,  Endo/Other  diabetes, Type 2, Oral Hypoglycemic Agents, Insulin Dependent  Renal/GU Renal disease  negative genitourinary   Musculoskeletal  (+) Arthritis -,   Abdominal   Peds negative pediatric ROS (+)  Hematology  (+) anemia ,   Anesthesia Other Findings   Reproductive/Obstetrics negative OB ROS                             Anesthesia Physical Anesthesia Plan  ASA: III  Anesthesia Plan: General   Post-op Pain Management:    Induction: Intravenous  Airway Management Planned: LMA  Additional Equipment:   Intra-op Plan:   Post-operative Plan: Extubation in OR  Informed Consent: I have reviewed the patients History and Physical, chart, labs and discussed the procedure including the risks, benefits and alternatives for the proposed anesthesia with the patient or authorized representative who has indicated his/her understanding and acceptance.   Dental advisory given  Plan Discussed with: CRNA  Anesthesia Plan Comments:         Anesthesia Quick Evaluation

## 2015-07-04 NOTE — Anesthesia Postprocedure Evaluation (Signed)
  Anesthesia Post-op Note  Patient: Steven Duffy  Procedure(s) Performed: Procedure(s) (LRB): RIGHT RETROGRADE PYELOGRAM,  RIGHT URETEROSCOPY  (Right) HOLMIUM LASER LITHOTRIPSY AND STENT PLACEMENT  (Right)  Patient Location: PACU  Anesthesia Type: General  Level of Consciousness: awake and alert   Airway and Oxygen Therapy: Patient Spontanous Breathing  Post-op Pain: mild  Post-op Assessment: Post-op Vital signs reviewed, Patient's Cardiovascular Status Stable, Respiratory Function Stable, Patent Airway and No signs of Nausea or vomiting  Last Vitals:  Filed Vitals:   07/04/15 1530  BP: 147/69  Pulse: 60  Temp: 36.3 C  Resp: 16    Post-op Vital Signs: stable   Complications: No apparent anesthesia complications

## 2015-07-04 NOTE — Anesthesia Procedure Notes (Signed)
Procedure Name: LMA Insertion Date/Time: 07/04/2015 12:24 PM Performed by: Carleene Cooper A Pre-anesthesia Checklist: Patient identified, Emergency Drugs available, Suction available, Patient being monitored and Timeout performed Patient Re-evaluated:Patient Re-evaluated prior to inductionOxygen Delivery Method: Circle system utilized Preoxygenation: Pre-oxygenation with 100% oxygen Intubation Type: IV induction LMA: LMA with gastric port inserted LMA Size: 4.0 Number of attempts: 1 Placement Confirmation: positive ETCO2 and breath sounds checked- equal and bilateral Tube secured with: Tape Dental Injury: Teeth and Oropharynx as per pre-operative assessment

## 2015-07-05 ENCOUNTER — Encounter (HOSPITAL_COMMUNITY): Payer: Self-pay | Admitting: Urology

## 2015-07-05 ENCOUNTER — Encounter: Payer: PRIVATE HEALTH INSURANCE | Attending: Family Medicine | Primary: Family Medicine

## 2015-09-02 DIAGNOSIS — I2510 Atherosclerotic heart disease of native coronary artery without angina pectoris: Secondary | ICD-10-CM | POA: Insufficient documentation

## 2015-09-26 DIAGNOSIS — G8194 Hemiplegia, unspecified affecting left nondominant side: Secondary | ICD-10-CM | POA: Diagnosis present

## 2016-01-05 NOTE — Telephone Encounter (Signed)
LMOM of refill denial (fluoxetine) and need for appt

## 2016-01-05 NOTE — Telephone Encounter (Signed)
pharmacy requesting refill. Please approve or deny this refill request. The order is pended.  Thanks     Last OV 04/05/15    No future appointments.

## 2016-01-17 ENCOUNTER — Ambulatory Visit: Admit: 2016-01-17 | Discharge: 2016-01-17 | Payer: MEDICARE | Attending: Family Medicine | Primary: Family Medicine

## 2016-01-17 ENCOUNTER — Encounter

## 2016-01-17 DIAGNOSIS — I10 Essential (primary) hypertension: Secondary | ICD-10-CM

## 2016-01-17 LAB — COMPREHENSIVE METABOLIC PANEL
ALT: 17 U/L (ref 0–41)
AST: 24 U/L (ref 0–40)
Albumin: 4 g/dL (ref 3.9–4.9)
Alkaline Phosphatase: 85 U/L (ref 35–104)
Anion Gap: 12 mEq/L (ref 7–13)
BUN: 21 mg/dL (ref 8–23)
CO2: 25 mEq/L (ref 22–29)
Calcium: 8.7 mg/dL (ref 8.6–10.2)
Chloride: 102 mEq/L (ref 98–107)
Creatinine: 0.83 mg/dL (ref 0.70–1.20)
GFR African American: >60 (ref >60)
GFR Non-African American: >60 (ref >60)
Globulin: 2.7 g/dL (ref 2.3–3.5)
Glucose: 110 mg/dL — ABNORMAL HIGH (ref 74–109)
Potassium: 4.6 mEq/L (ref 3.5–5.1)
Sodium: 139 mEq/L (ref 132–144)
Total Bilirubin: 0.6 mg/dL (ref 0.0–1.2)
Total Protein: 6.7 g/dL (ref 6.4–8.1)

## 2016-01-17 LAB — PSA SCREENING: PSA: 0.55 ng/mL (ref 0.00–6.22)

## 2016-01-17 LAB — LIPID PANEL
Cholesterol, Total: 146 mg/dL (ref 0–199)
HDL: 53 mg/dL (ref 40–59)
LDL Calculated: 78 mg/dL (ref 0–129)
Triglycerides: 77 mg/dL (ref 0–200)

## 2016-01-17 MED ORDER — NAPROXEN 500 MG PO TABS
500 MG | ORAL_TABLET | Freq: Two times a day (BID) | ORAL | 3 refills | Status: DC
Start: 2016-01-17 — End: 2017-12-07

## 2016-01-17 MED ORDER — LISINOPRIL 20 MG PO TABS
20 MG | ORAL_TABLET | Freq: Every day | ORAL | 3 refills | Status: DC
Start: 2016-01-17 — End: 2016-10-05

## 2016-01-17 MED ORDER — MOMETASONE FUROATE 0.1 % EX CREA
0.1 % | CUTANEOUS | 3 refills | Status: DC
Start: 2016-01-17 — End: 2017-01-18

## 2016-01-17 MED ORDER — FLUOXETINE HCL 40 MG PO CAPS
40 MG | ORAL_CAPSULE | Freq: Every day | ORAL | 3 refills | Status: DC
Start: 2016-01-17 — End: 2016-10-05

## 2016-01-17 MED ORDER — LOVASTATIN 40 MG PO TABS
40 MG | ORAL_TABLET | Freq: Every evening | ORAL | 3 refills | Status: DC
Start: 2016-01-17 — End: 2016-10-05

## 2016-01-17 NOTE — Progress Notes (Signed)
Chief Complaint   Patient presents with   ??? Hypertension     Annual check up   ??? Medication Refill   no cp/sob  bp stable    Obese should lose weight    oa stable  Stable pain pattern    Mood stable  No suicidal/homocidal ideation. No psychotic or manic symptoms.    Hi chol  Watches diet    Patient presents for  exam.        Patient Active Problem List   Diagnosis   ??? Dermatitis   ??? Essential hypertension   ??? Primary osteoarthritis involving multiple joints   ??? 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    Past Medical History   Diagnosis Date   ??? Anxiety    ??? Diverticulitis    ??? Hand eczema        Past Surgical History   Procedure Laterality Date   ??? Colonoscopy  12/31/07     DR FLEITES   ??? Tonsillectomy         family history includes Heart Disease in his mother; Liver Cancer in his father; Prostate Cancer in his brother.    Social History     Social History   ??? Marital status: Married     Spouse name: N/A   ??? Number of children: N/A   ??? Years of education: N/A     Occupational History   ??? Not on file.     Social History Main Topics   ??? Smoking status: Former Smoker     Packs/day: 1.00     Years: 40.00     Quit date: 12/01/1995   ??? Smokeless tobacco: Never Used   ??? Alcohol use Not on file   ??? Drug use: Not on file   ??? Sexual activity: Not on file     Other Topics Concern   ??? Not on file     Social History Narrative       No Known Allergies    Review of Systems - General ROS: negative  Psychological ROS: negative  ENT ROS: negative  Hematological and Lymphatic ROS: negative  Endocrine ROS: negative  Respiratory ROS: no cough, shortness of breath, or wheezing  Cardiovascular ROS: no chest pain or dyspnea on exertion  Gastrointestinal ROS: no abdominal pain, change in bowel habits, or black or bloody stools  Genito-Urinary ROS: no dysuria, trouble voiding, or  hematuria  Musculoskeletal ROS: negative  Neurological ROS: no TIA or stroke symptoms  Dermatological ROS: negative    Blood pressure 126/80, pulse 69, temperature 97 ??F (36.1 ??C), temperature source Temporal, resp. rate 16, height  (1.803 m), weight 250 lb 3.2 oz (113.5 kg).    Physical Examination: General appearance - alert, well appearing, and in no distress  Mental status - alert, oriented to person, place, and time  Eyes - pupils equal and reactive, extraocular eye movements intact  Ears - bilateral TM's and external ear canals normal  Mouth - mucous membranes moist, pharynx normal without lesions  Neck - supple, no significant adenopathy  Lymphatics - no palpable lymphadenopathy, no hepatosplenomegaly  Chest - clear to auscultation, no wheezes, rales or rhonchi, symmetric air entry  Heart - normal rate, regular rhythm, normal S1, S2, no murmurs, rubs, clicks or gallops  Abdomen - soft, nontender, nondistended, no masses or organomegaly, obese  Neurological -  alert, oriented, normal speech, no focal findings or movement disorder noted  Musculoskeletal - no joint tenderness, deformity or swelling  Extremities - peripheral pulses normal, no pedal edema, no clubbing or cyanosis  Skin - dermatitis, o/w normal coloration and turgor, no rashes, no suspicious skin lesions noted;    Assessment:    1. Essential hypertension  Comprehensive Metabolic Panel   2. Primary osteoarthritis involving multiple joints  Comprehensive Metabolic Panel   3. Non morbid obesity due to excess calories  Comprehensive Metabolic Panel   4. Moderate episode of recurrent major depressive disorder (HCC)  Comprehensive Metabolic Panel   5. Mixed hyperlipidemia  Comprehensive Metabolic Panel    Lipid Panel   6. Prostate cancer screening  PSA Screening       Plan:    Orders Placed This Encounter   Procedures   ??? Pneumococcal conjugate vaccine 13-valent   ??? Comprehensive Metabolic Panel     Standing Status:   Future     Number of  Occurrences:   1     Standing Expiration Date:   01/16/2017   ??? Lipid Panel     Standing Status:   Future     Number of Occurrences:   1     Standing Expiration Date:   01/16/2017     Order Specific Question:   Is Patient Fasting?/# of Hours     Answer:   10-12   ??? PSA Screening     Standing Status:   Future     Number of Occurrences:   1     Standing Expiration Date:   01/16/2017       Outpatient Encounter Prescriptions as of 01/17/2016   Medication Sig Dispense Refill   ??? lovastatin (MEVACOR) 40 MG tablet Take 1 tablet by mouth nightly 90 tablet 3   ??? lisinopril (PRINIVIL;ZESTRIL) 20 MG tablet Take 1 tablet by mouth daily 90 tablet 3   ??? naproxen (NAPROSYN) 500 MG tablet Take 1 tablet by mouth 2 times daily (with meals) 180 tablet 3   ??? FLUoxetine (PROZAC) 40 MG capsule Take 1 capsule by mouth daily 90 capsule 3   ??? mometasone (ELOCON) 0.1 % cream Apply topically daily. 45 g 3   ??? [DISCONTINUED] lovastatin (MEVACOR) 40 MG tablet Take 1 tablet by mouth nightly 90 tablet 3   ??? [DISCONTINUED] mometasone (ELOCON) 0.1 % cream Apply topically daily. 45 g 3   ??? [DISCONTINUED] lisinopril (PRINIVIL;ZESTRIL) 20 MG tablet Take 1 tablet by mouth daily 90 tablet 3   ??? [DISCONTINUED] naproxen (NAPROSYN) 500 MG tablet Take 1 tablet by mouth 2 times daily (with meals) 180 tablet 3   ??? [DISCONTINUED] FLUoxetine (PROZAC) 40 MG capsule Take 1 capsule by mouth daily 90 capsule 3     No facility-administered encounter medications on file as of 01/17/2016.      Recommend lifestyle modification.      Return in about 3 months (around 04/15/2016).    Patient education provided. They understand and agree with this course of treatment. They will return with new or worsening symptoms. Patient instructed to remain current with appropriate annual health maintenance.

## 2016-01-17 NOTE — Patient Instructions (Signed)
Depression Treatment: Care Instructions  Your Care Instructions  Depression is a condition that affects the way you feel, think, and act. It causes symptoms such as low energy, loss of interest in daily activities, and sadness or grouchiness that goes on for a long time. Depression is very common and affects men and women of all ages.  Depression is a medical illness caused by changes in the natural chemicals in your brain. It is not a character flaw, and it does not mean that you are a bad or weak person. It does not mean that you are going crazy.  It is important to know that depression can be treated. Medicines, counseling, and self-care can all help. Many people do not get help because they are embarrassed or think that they will get over the depression on their own. But some people do not get better without treatment.  Follow-up care is a key part of your treatment and safety. Be sure to make and go to all appointments, and call your doctor if you are having problems. It's also a good idea to know your test results and keep a list of the medicines you take.  How can you care for yourself at home?  Learn about antidepressant medicines  Antidepressant medicines can improve or end the symptoms of depression. You may need to take the medicine for at least 6 months, and often longer. Keep taking your medicine even if you feel better. If you stop taking it too soon, your symptoms may come back or get worse.  You may start to feel better within 1 to 3 weeks of taking antidepressant medicine. But it can take as many as 6 to 8 weeks to see more improvement. Talk to your doctor if you have problems with your medicine or if you do not notice any improvement after 3 weeks.  Antidepressants can make you feel tired, dizzy, or nervous. Some people have dry mouth, constipation, headaches, sexual problems, an upset stomach, or diarrhea. Many of these side effects are mild and go away on their own after you take the medicine  for a few weeks. Some may last longer. Talk to your doctor if side effects bother you too much. You might be able to try a different medicine. If you are pregnant or breastfeeding, talk to your doctor about what medicines you can take.  Learn about counseling  In many cases, counseling can work as well as medicines to treat mild to moderate depression. Counseling is done by licensed mental health providers, such as psychologists, social workers, and some types of nurses. It can be done in one-on-one sessions or in a group setting. Many people find group sessions helpful.  Cognitive-behavioral therapy is a type of counseling. In this treatment therapy, you learn how to see and change unhelpful thinking styles that may be adding to your depression. Counseling and medicines often work well when used together.  To manage depression  ?? Be physically active. Getting 30 minutes of exercise each day is good for your body and your mind. Begin slowly if it is hard for you to get started. If you already exercise, keep it up.  ?? Plan something pleasant for yourself every day. Include activities that you have enjoyed in the past.  ?? Get enough sleep. Talk to your doctor if you have problems sleeping.  ?? Eat a balanced diet. If you do not feel hungry, eat small snacks rather than large meals.  ?? Do not drink alcohol, use illegal   drugs, or take medicines that your doctor has not prescribed for you. They may interfere with your treatment.  ?? Spend time with family and friends. It may help to speak openly about your depression with people you trust.  ?? Take your medicines exactly as prescribed. Call your doctor if you think you are having a problem with your medicine.  ?? Do not make major life decisions while you are depressed. Depression may change the way you think. You will be able to make better decisions after you feel better.  ?? Think positively. Challenge negative thoughts with statements such as "I am hopeful"; "Things will  get better"; and "I can ask for the help I need." Write down these statements and read them often, even if you don't believe them yet.  ?? Be patient with yourself. It took time for your depression to develop, and it will take time for your symptoms to improve. Do not take on too much or be too hard on yourself.  ?? Learn all you can about depression from written and online materials.  ?? Check out behavioral health classes to learn more about dealing with depression.  ?? Keep the numbers for these national suicide hotlines: 1-800-273-TALK (1-800-273-8255) and 1-800-SUICIDE (1-800-784-2433). If you or someone you know talks about suicide or feeling hopeless, get help right away.  When should you call for help?  Call 911 anytime you think you may need emergency care. For example, call if:  ?? You feel you cannot stop from hurting yourself or someone else.  Call your doctor now or seek immediate medical care if:  ?? You hear voices.  ?? You feel much more depressed.  Watch closely for changes in your health, and be sure to contact your doctor if:  ?? You are having problems with your depression medicine.  ?? You are not getting better as expected.  Where can you learn more?  Go to [email or web address] and sign in to your MyChart account. Enter G693 in the Search Health Information box to learn more about ???Depression Treatment: Care Instructions.???    If you do not have an account, please click on the ???Sign Up Now??? link.  ?? 2006-2016 [employer]. Care instructions adapted under license by [facility]. This care instruction is for use with your licensed healthcare professional. If you have questions about a medical condition or this instruction, always ask your healthcare professional. Health[employer], Inc[employer] disclaims any warranty or liability for your use of this information.  Content Version: 11.0.578772; Current as of: October 29, 2014

## 2016-04-17 ENCOUNTER — Encounter: Attending: Family Medicine | Primary: Family Medicine

## 2016-04-26 ENCOUNTER — Encounter: Payer: MEDICARE | Attending: Family Medicine | Primary: Family Medicine

## 2016-07-05 ENCOUNTER — Ambulatory Visit: Admit: 2016-07-05 | Discharge: 2016-07-05 | Payer: MEDICARE | Attending: Family Medicine | Primary: Family Medicine

## 2016-07-05 ENCOUNTER — Encounter

## 2016-07-05 DIAGNOSIS — L309 Dermatitis, unspecified: Secondary | ICD-10-CM

## 2016-07-05 MED ORDER — ASPIRIN EC 81 MG PO TBEC
81 MG | ORAL_TABLET | Freq: Every day | ORAL | 3 refills | Status: DC
Start: 2016-07-05 — End: 2017-12-07

## 2016-07-05 NOTE — Patient Instructions (Signed)
Learning About Obesity  What is obesity?    Obesity means having so much body fat that your health is in danger. Having too much body fat can lead to type 2 diabetes, heart disease, high blood pressure, arthritis, sleep apnea, and stroke.  Even if you don't feel bad now, think about these health risks. Do they seem like a good reason to start on a new path toward a healthier weight? Or do you have another personal, powerful reason for wanting to lose weight? Whatever it is, keep it in mind. It can be hard to change eating habits and exercise habits. But with your own reason and plan, you can do it.  How do you know if your weight is in the obesity range?  To know if your weight is in the obesity range, your doctor looks at your body mass index (BMI) and waist size.  Your BMI is a number that is calculated from your weight and your height. To figure your BMI for yourself, get a BMI table from your doctor or use an online tool, such as http://www.nhlbi.nih.gov/health/educational/lose_wt/BMI/bmicalc.htm on the National Institutes of Health website.  What causes obesity?  When you take in more calories than you burn off, you gain weight. How you eat, how active you are, and other things affect how your body uses calories and whether you gain weight.  If you have family members who have too much body fat, you may have inherited a tendency to gain weight. And your family also helps form your eating and lifestyle habits, which can lead to obesity.  Also, our busy lives make it harder to plan and cook healthy meals. For many of us, it's easier to reach for prepared foods, go out to eat, or go to the drive-through. But these foods are often high in saturated fat and calories. Portions are often too large.  What can you do to reach a healthy weight?  Focus on health, not diets. Diets are hard to stay on and don't work in the long run. It is very hard to stay with a diet that includes lots of big changes in your eating  habits.  Instead of a diet, focus on lifestyle changes that will improve your health and achieve the right balance of energy and calories. To lose weight, you need to burn more calories than you take in. You can do it by eating healthy foods in reasonable amounts and becoming more active, even a little bit every day. Making small changes over time can add up to a lot.  Make a plan for change. Many people have found that naming their reasons for change and staying focused on their plan can make a big difference. Work with your doctor to create a plan that is right for you.  ?? Ask yourself: "What are my personal, most powerful reasons for wanting this change? What will my life look like when I've made the change?"  ?? Set your long-term goal. Make it specific, such as "I will lose x pounds."  ?? Break your long-term goal into smaller, short-term goals. Make these small steps specific and within your reach, things you know you can do. These steps are what keep you going from day to day.  How can you stay on your plan for change?  Be ready. Choose to start during a time when there are few events that might trigger slip-ups, like holidays, social events, and high-stress periods.  Decide on your first few steps. Most people   have more success when they make small changes, one step at a time. For example, you might switch a daily candy bar to a piece of fruit, walk 10 minutes more, or add more vegetables to a meal.  Line up your support people. Make sure you're not going to be alone as you make this change. Connect with people who understand how important it is to you. Ask family members and friends for help in keeping with your plan. And think about who could make it harder for you, and how to handle them.  Try tracking. People who keep track of what they eat, feel, and do are better at losing weight. Try writing down things like:  ?? What and how much you eat.  ?? How you feel before and after each meal.  ?? Details about each  meal (like eating out or at home, eating alone, or with friends or family).  ?? What you do to be active.  Look and plan. As you track, look for patterns that you may want to change. Take note of:  ?? When you eat and whether you skip meals.  ?? How often you eat out.  ?? How many fruits and vegetables you eat.  ?? When you eat beyond feeling full.  ?? When and why you eat for reasons other than being hungry.  When you stray from your plan, don't get upset. Figure out what made you slip up and how you can fix it.  Can you take medicines or have surgery to lose weight?  Before your doctor will prescribe medicines or surgery, he or she will probably want you to be more active and follow your healthy eating plan for a period of time. These habits are key lifelong changes for managing your weight, with or without other medical treatment. And these changes can help you avoid weight-related health problems.  Follow-up care is a key part of your treatment and safety. Be sure to make and go to all appointments, and call your doctor if you are having problems. It's also a good idea to know your test results and keep a list of the medicines you take.  Where can you learn more?  Go to [email or web address] and sign in to your MyChart account. Enter N111 in the Search Health Information box to learn more about "Learning About Obesity."     If you do not have an account, please click on the "Sign Up Now" link.  Current as of: September 22, 2015  Content Version: 11.2  ?? 2006-2017 [employer]. Care instructions adapted under license by [facility]. If you have questions about a medical condition or this instruction, always ask your healthcare professional. [employer] disclaims any warranty or liability for your use of this information.

## 2016-07-06 LAB — CBC WITH AUTO DIFFERENTIAL
Basophils %: 0.7 %
Basophils Absolute: 0 10*3/uL (ref 0.0–0.2)
Eosinophils %: 1 %
Eosinophils Absolute: 0.1 10*3/uL (ref 0.0–0.7)
Hematocrit: 42.6 % (ref 42.0–52.0)
Hemoglobin: 14.1 g/dL (ref 14.0–18.0)
Lymphocytes %: 17.7 %
Lymphocytes Absolute: 1.2 10*3/uL (ref 1.0–4.8)
MCH: 29.2 pg (ref 27.0–31.3)
MCHC: 33.2 % (ref 33.0–37.0)
MCV: 88.1 fL (ref 80.0–100.0)
Monocytes %: 7.7 %
Monocytes Absolute: 0.5 10*3/uL (ref 0.2–0.8)
Neutrophils %: 72.9 %
Neutrophils Absolute: 4.9 10*3/uL (ref 1.4–6.5)
Platelets: 122 10*3/uL — ABNORMAL LOW (ref 130–400)
RBC: 4.84 M/uL (ref 4.70–6.10)
RDW: 13.7 % (ref 11.5–14.5)
WBC: 6.7 10*3/uL (ref 4.8–10.8)

## 2016-07-19 NOTE — Progress Notes (Signed)
Chief Complaint   Patient presents with   ??? Other     pt states that he has a spot on his arm that he is concerned about   Blood pressure is stable  no cp/sob  bp stable    Obese should lose weight    Some bruising  Would like blood work  Declines hematology evaluation    oa controlled  Stable pain pattern    Mood controlled  No suicidal/homocidal ideation. No psychotic or manic symptoms.    Hi chol continues  Watches diet    Skin lesion right arm  Would like dermatology evaluation    Patient presents for  exam.        Patient Active Problem List   Diagnosis   ??? Dermatitis   ??? Essential hypertension   ??? Primary osteoarthritis involving multiple joints   ??? Non morbid obesity due to excess calories       has a current medication list which includes the following prescription(s): aspirin ec, lovastatin, lisinopril, naproxen, fluoxetine, and mometasone.    Past Medical History:   Diagnosis Date   ??? Anxiety    ??? Diverticulitis    ??? Hand eczema        Past Surgical History:   Procedure Laterality Date   ??? COLONOSCOPY  12/31/07    DR FLEITES   ??? TONSILLECTOMY         family history includes Heart Disease in his mother; Liver Cancer in his father; Prostate Cancer in his brother.    Social History     Social History   ??? Marital status: Married     Spouse name: N/A   ??? Number of children: N/A   ??? Years of education: N/A     Occupational History   ??? Not on file.     Social History Main Topics   ??? Smoking status: Former Smoker     Packs/day: 1.00     Years: 40.00     Quit date: 12/01/1995   ??? Smokeless tobacco: Never Used   ??? Alcohol use Not on file   ??? Drug use: Not on file   ??? Sexual activity: Not on file     Other Topics Concern   ??? Not on file     Social History Narrative       No Known Allergies    Review of Systems - General ROS: negative  Psychological ROS: negative  ENT ROS: negative  Hematological and Lymphatic ROS: negative  Endocrine ROS: negative  Respiratory ROS: no cough, shortness of breath, or  wheezing  Cardiovascular ROS: no chest pain or dyspnea on exertion  Gastrointestinal ROS: no abdominal pain, change in bowel habits, or black or bloody stools  Genito-Urinary ROS: no dysuria, trouble voiding, or hematuria  Musculoskeletal ROS: negative  Neurological ROS: no TIA or stroke symptoms  Dermatological ROS: Lesion    Blood pressure 130/82, pulse 69, temperature 97.6 ??F (36.4 ??C), temperature source Temporal, resp. rate 24, height  (1.803 m), weight 255 lb 1.6 oz (115.7 kg), SpO2 97 %.    Physical Examination: General appearance - alert, well appearing, and in no distress  Mental status - alert, oriented to person, place, and time  Eyes - pupils equal and reactive, extraocular eye movements intact  Ears - bilateral TM's and external ear canals normal  Mouth - mucous membranes moist, pharynx normal without lesions  Neck - supple, no significant adenopathy  Lymphatics - no palpable lymphadenopathy, no hepatosplenomegaly  Chest - clear to auscultation, no wheezes, rales or rhonchi, symmetric air entry  Heart - normal rate, regular rhythm, normal S1, S2, no murmurs, rubs, clicks or gallops  Abdomen - soft, nontender, nondistended, no masses or organomegaly, obese  Neurological - alert, oriented, normal speech, no focal findings or movement disorder noted  Musculoskeletal - no joint tenderness, deformity or swelling  Extremities - peripheral pulses normal, no pedal edema, no clubbing or cyanosis  Skin - skin lesion right arm, possible senile macule versus nevus, otherwise dermatitis, o/w normal coloration and turgor, no rashes, no suspicious skin lesions noted;    Assessment:    1. Dermatitis     2. Essential hypertension     3. Primary osteoarthritis involving multiple joints     4. 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 Ecchymosis  CBC Auto Differential   6. Skin lesion  Amb External Referral To Dermatology       Plan:    Orders Placed This Encounter   Procedures   ??? CBC Auto Differential      Standing Status:   Future     Number of Occurrences:   1     Standing Expiration Date:   07/05/2017   ??? Amb External Referral To Dermatology     Referral Priority:   Routine     Referral Type:   Consult for Advice and Opinion     Referral Reason:   Specialty Services Required     Referred to Provider:   Simona HuhPaul Hazen, MD     Requested Specialty:   Dermatology     Number of Visits Requested:   1       Outpatient Encounter Prescriptions as of 07/05/2016   Medication Sig Dispense Refill   ??? aspirin EC 81 MG EC tablet Take 1 tablet by mouth daily 30 tablet 3   ??? lovastatin (MEVACOR) 40 MG tablet Take 1 tablet by mouth nightly 90 tablet 3   ??? lisinopril (PRINIVIL;ZESTRIL) 20 MG tablet Take 1 tablet by mouth daily 90 tablet 3   ??? naproxen (NAPROSYN) 500 MG tablet Take 1 tablet by mouth 2 times daily (with meals) 180 tablet 3   ??? FLUoxetine (PROZAC) 40 MG capsule Take 1 capsule by mouth daily 90 capsule 3   ??? mometasone (ELOCON) 0.1 % cream Apply topically daily. 45 g 3     No facility-administered encounter medications on file as of 07/05/2016.      Recommend lifestyle modification.  Report persistence of ecchymosis  Weight loss    Return in about 3 months (around 10/05/2016).    Patient education provided. They understand and agree with this course of treatment. They will return with new or worsening symptoms. Patient instructed to remain current with appropriate annual health maintenance.

## 2016-08-08 DIAGNOSIS — R569 Unspecified convulsions: Secondary | ICD-10-CM

## 2016-08-08 DIAGNOSIS — I69398 Other sequelae of cerebral infarction: Secondary | ICD-10-CM

## 2016-08-09 ENCOUNTER — Other Ambulatory Visit: Payer: Self-pay | Admitting: Family Medicine

## 2016-08-09 DIAGNOSIS — R569 Unspecified convulsions: Secondary | ICD-10-CM

## 2016-08-15 ENCOUNTER — Ambulatory Visit
Admission: RE | Admit: 2016-08-15 | Discharge: 2016-08-15 | Disposition: A | Discharge status: Home / Self Care | Payer: No Typology Code available for payment source | Source: Ambulatory Visit | Attending: Family Medicine | Admitting: Family Medicine

## 2016-08-15 DIAGNOSIS — R569 Unspecified convulsions: Secondary | ICD-10-CM

## 2016-10-05 ENCOUNTER — Ambulatory Visit: Admit: 2016-10-05 | Discharge: 2016-10-05 | Payer: MEDICARE | Attending: Family Medicine | Primary: Family Medicine

## 2016-10-05 DIAGNOSIS — I10 Essential (primary) hypertension: Secondary | ICD-10-CM

## 2016-10-05 NOTE — Progress Notes (Signed)
Chief Complaint   Patient presents with   ??? Hypertension     3 mo f/u   Blood pressure is relatively stable  no cp/sob  bp stable    Obese still  should lose weight    oa well controlled  Stable pain pattern    Mood is controlled  No suicidal/homocidal ideation. No psychotic or manic symptoms.    Hi chol continues  Watches diet       Patient Active Problem List   Diagnosis   ??? Dermatitis   ??? Essential hypertension   ??? Primary osteoarthritis involving multiple joints   ??? 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    Past Medical History:   Diagnosis Date   ??? Anxiety    ??? Diverticulitis    ??? Hand eczema        Past Surgical History:   Procedure Laterality Date   ??? COLONOSCOPY  12/31/07    DR FLEITES   ??? TONSILLECTOMY         family history includes Heart Disease in his mother; Liver Cancer in his father; Prostate Cancer in his brother.    Social History     Social History   ??? Marital status: Married     Spouse name: N/A   ??? Number of children: N/A   ??? Years of education: N/A     Occupational History   ??? Not on file.     Social History Main Topics   ??? Smoking status: Former Smoker     Packs/day: 1.00     Years: 40.00     Quit date: 12/01/1995   ??? Smokeless tobacco: Never Used   ??? Alcohol use Not on file   ??? Drug use: Unknown   ??? Sexual activity: Not on file     Other Topics Concern   ??? Not on file     Social History Narrative   ??? No narrative on file       No Known Allergies    Review of Systems - General ROS: negative  Psychological ROS: negative  ENT ROS: negative  Hematological and Lymphatic ROS: negative  Endocrine ROS: negative  Respiratory ROS: no cough, shortness of breath, or wheezing  Cardiovascular ROS: no chest pain or dyspnea on exertion  Gastrointestinal ROS: no abdominal pain, change in bowel habits, or black or bloody stools  Genito-Urinary ROS: no  dysuria, trouble voiding, or hematuria  Musculoskeletal ROS: negative  Neurological ROS: no TIA or stroke symptoms  Dermatological ROS: Lesion    Blood pressure 136/76, pulse 75, temperature 97.3 ??F (36.3 ??C), temperature source Temporal, resp. rate 16, height 5\' 11"  (1.803 m), weight 252 lb (114.3 kg), SpO2 98 %.    Physical Examination: General appearance - alert, well appearing, and in no distress  Mental status - alert, oriented to person, place, and time  Eyes - pupils equal and reactive, extraocular eye movements intact  Ears - bilateral TM's and external ear canals normal  Mouth - mucous membranes moist, pharynx normal without lesions  Neck - supple, no significant adenopathy  Lymphatics - no palpable lymphadenopathy, no hepatosplenomegaly  Chest - clear to auscultation, no wheezes, rales or rhonchi, symmetric air entry  Heart - normal rate, regular rhythm, normal S1, S2, no murmurs, rubs, clicks or gallops  Abdomen - soft, nontender, nondistended, no masses or  organomegaly, obese  Neurological - alert, oriented, normal speech, no focal findings or movement disorder noted  Musculoskeletal - no joint tenderness, deformity or swelling  Extremities - peripheral pulses normal, no pedal edema, no clubbing or cyanosis  Skin - skin lesion right arm, possible senile macule versus nevus, otherwise dermatitis, o/w normal coloration and turgor, no rashes, no suspicious skin lesions noted;    Assessment:    1. Essential hypertension     2. Primary osteoarthritis involving multiple joints     3. 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 Dermatitis         Plan:    No orders of the defined types were placed in this encounter.      Outpatient Encounter Prescriptions as of 10/05/2016   Medication Sig Dispense Refill   ??? aspirin EC 81 MG EC tablet Take 1 tablet by mouth daily 30 tablet 3   ??? naproxen (NAPROSYN) 500 MG tablet Take 1 tablet by mouth 2 times daily (with meals) 180 tablet 3   ??? mometasone (ELOCON) 0.1 %  cream Apply topically daily. 45 g 3   ??? [DISCONTINUED] lovastatin (MEVACOR) 40 MG tablet Take 1 tablet by mouth nightly 90 tablet 3   ??? [DISCONTINUED] lisinopril (PRINIVIL;ZESTRIL) 20 MG tablet Take 1 tablet by mouth daily 90 tablet 3   ??? [DISCONTINUED] FLUoxetine (PROZAC) 40 MG capsule Take 1 capsule by mouth daily 90 capsule 3     No facility-administered encounter medications on file as of 10/05/2016.      Recommend lifestyle modification.  Report persistence of ecchymosis  Weight loss    Return in about 3 months (around 01/05/2017) for record flu shot.    Patient education provided. They understand and agree with this course of treatment. They will return with new or worsening symptoms. Patient instructed to remain current with appropriate annual health maintenance.

## 2016-10-05 NOTE — Telephone Encounter (Signed)
Pharmacy requests refill on medication. Please approve or deny this request.  Last seen by you on 10/05/2016.    Future Appointments  Date Time Provider Department Center   01/04/2017 1:00 PM Letta MedianNicholas Ksenich, MD [facility] [place] [facility]

## 2016-10-05 NOTE — Patient Instructions (Signed)
Patient Education        Osteoarthritis: Care Instructions  Your Care Instructions    Arthritis is a common health problem in which the joints are inflamed. There are several kinds of arthritis. Osteoarthritis is caused by a breakdown of cartilage, the hard, thick tissue that cushions the joints. It causes pain, stiffness, and swelling, often in the spine, fingers, hips, and knees. Osteoarthritis can happen at any age, but it is most common in older people.  Osteoarthritis never goes away completely, but it can be controlled. Medicine and home treatment can reduce the pain and prevent the arthritis from getting worse.  Follow-up care is a key part of your treatment and safety. Be sure to make and go to all appointments, and call your doctor if you are having problems. It's also a good idea to know your test results and keep a list of the medicines you take.  How can you care for yourself at home?  ?? Take a warm shower or bath in the morning to relieve stiffness. Avoid sitting still afterwards.  ?? If the joint is not swollen, use moist heat, like a warm, damp towel, for 20 to 30 minutes, 2 or 3 times a day. Do not use heat on a swollen joint.  ?? If the joint is swollen, use ice or cold packs for 10 to 20 minutes, once an hour. Cold will help relieve pain and reduce inflammation. Put a thin cloth between the ice and your skin.  ?? To prevent stiffness, gently move the joint through its full range of motion several times a day.  ?? If the joint hurts, avoid activities that put a strain on it for a few days. Take rest breaks throughout the day.  ?? Get regular exercise. Walking, swimming, yoga, biking, tai chi, and water aerobics are good exercises that are gentle on the joints.  ?? Reach and stay at a healthy weight. If you need to lose or maintain weight, regular exercise and a healthy diet will help. Extra weight can strain the joints, especially the knees and hips, and make the pain worse. Losing even a few pounds may  help.  ?? Take pain medicines exactly as directed.  ?? If the doctor gave you a prescription medicine for pain, take it as prescribed.  ?? If you are not taking a prescription pain medicine, ask your doctor if you can take an over-the-counter medicine.  When should you call for help?  Call your doctor now or seek immediate medical care if:  ?? The pain is so bad that you cannot use the joint.  ?? You have sudden back pain with weakness in your legs or loss of bowel or bladder control.  ?? Your stools are black and tarlike or have streaks of blood.  ?? You have severe pain and swelling in more than one joint.  Watch closely for changes in your health, and be sure to contact your doctor if:  ?? You have side effects from the medicines, like belly pain, ongoing heartburn, or nausea.  ?? Joint pain continues for more than 6 weeks, and home treatment is not helping.  Where can you learn more?  Go to [email or web address] and sign in to your MyChart account. Enter U459 in the Search Health Information box to learn more about "Osteoarthritis: Care Instructions."     If you do not have an account, please click on the "Sign Up Now" link.  Current as of: November 07, 2015  Content   Version: 11.3  ?? 2006-2017 [employer]. Care instructions adapted under license by [facility]. If you have questions about a medical condition or this instruction, always ask your healthcare professional. [employer] disclaims any warranty or liability for your use of this information.

## 2016-10-08 MED ORDER — LISINOPRIL 20 MG PO TABS
20 MG | ORAL_TABLET | Freq: Every day | ORAL | 2 refills | Status: DC
Start: 2016-10-08 — End: 2017-01-04

## 2016-10-08 MED ORDER — FLUOXETINE HCL 40 MG PO CAPS
40 MG | ORAL_CAPSULE | Freq: Every day | ORAL | 2 refills | Status: DC
Start: 2016-10-08 — End: 2017-01-01

## 2016-10-08 MED ORDER — LOVASTATIN 40 MG PO TABS
40 MG | ORAL_TABLET | Freq: Every evening | ORAL | 2 refills | Status: DC
Start: 2016-10-08 — End: 2017-01-01

## 2016-11-13 ENCOUNTER — Other Ambulatory Visit: Payer: Self-pay | Admitting: Urology

## 2016-12-12 ENCOUNTER — Other Ambulatory Visit: Payer: Self-pay | Admitting: Urology

## 2016-12-12 ENCOUNTER — Encounter (HOSPITAL_COMMUNITY): Payer: Self-pay

## 2016-12-12 NOTE — Patient Instructions (Signed)
TYWON NIDAY  12/12/2016   Your procedure is scheduled on: 12/17/2016    Report to [facility] Main  Entrance take [place]  elevators to 3rd floor to  [place] at   0800 AM.  Call this number if you have problems the morning of surgery [phone]   Remember: ONLY 1 PERSON MAY GO WITH YOU TO SHORT STAY TO GET  READY MORNING OF [place].  Do not eat food or drink liquids :After Midnight.     Take these medicines the morning of surgery with A SIP OF WATER: Eye drops as usual , digoxin, metoprolol ( lopressor), lexapro, proscar, oxycodone If needed, protonix, flomax  DO NOT TAKE ANY DIABETIC MEDICATIONS DAY OF YOUR SURGERY                               You may not have any metal on your body including hair pins and              piercings  Do not wear jewelry, , lotions, powders or perfumes, deodorant                          Men may shave face and neck.   Do not bring valuables to the hospital. [place].  Contacts, dentures or bridgework may not be worn into surgery.       Patients discharged the day of surgery will not be allowed to drive home.  Name and phone number of your driver:  Special Instructions: N/A              Please read over the following fact sheets you were given: _____________________________________________________________________             [facility] - Preparing for Surgery Before surgery, you can play an important role.  Because skin is not sterile, your skin needs to be as free of germs as possible.  You can reduce the number of germs on your skin by washing with CHG (chlorahexidine gluconate) soap before surgery.  CHG is an antiseptic cleaner which kills germs and bonds with the skin to continue killing germs even after washing. Please DO NOT use if you have an allergy to CHG or antibacterial soaps.  If your skin becomes reddened/irritated stop using the CHG and inform  your nurse when you arrive at Short Stay. Do not shave (including legs and underarms) for at least 48 hours prior to the first CHG shower.  You may shave your face/neck. Please follow these instructions carefully:  1.  Shower with CHG Soap the night before surgery and the  morning of Surgery.  2.  If you choose to wash your hair, wash your hair first as usual with your  normal  shampoo.  3.  After you shampoo, rinse your hair and body thoroughly to remove the  shampoo.                           4.  Use CHG as you would any other liquid soap.  You can apply chg directly  to the skin and wash  Gently with a scrungie or clean washcloth.  5.  Apply the CHG Soap to your body ONLY FROM THE NECK DOWN.   Do not use on face/ open                           Wound or open sores. Avoid contact with eyes, ears mouth and genitals (private parts).                       Wash face,  Genitals (private parts) with your normal soap.             6.  Wash thoroughly, paying special attention to the area where your surgery  will be performed.  7.  Thoroughly rinse your body with warm water from the neck down.  8.  DO NOT shower/wash with your normal soap after using and rinsing off  the CHG Soap.                9.  Pat yourself dry with a clean towel.            10.  Wear clean pajamas.            11.  Place clean sheets on your bed the night of your first shower and do not  sleep with pets. Day of Surgery : Do not apply any lotions/deodorants the morning of surgery.  Please wear clean clothes to the hospital/surgery center.  FAILURE TO FOLLOW THESE INSTRUCTIONS MAY RESULT IN THE CANCELLATION OF YOUR SURGERY PATIENT SIGNATURE_________________________________  NURSE SIGNATURE__________________________________  ________________________________________________________________________ How to Manage Your Diabetes Before and After Surgery  Why is it important to control my blood sugar before and  after surgery? . Improving blood sugar levels before and after surgery helps healing and can limit problems. . A way of improving blood sugar control is eating a healthy diet by: o  Eating less sugar and carbohydrates o  Increasing activity/exercise o  Talking with your doctor about reaching your blood sugar goals . High blood sugars (greater than 180 mg/dL) can raise your risk of infections and slow your recovery, so you will need to focus on controlling your diabetes during the weeks before surgery. . Make sure that the doctor who takes care of your diabetes knows about your planned surgery including the date and location.  How do I manage my blood sugar before surgery? . Check your blood sugar at least 4 times a day, starting 2 days before surgery, to make sure that the level is not too high or low. o Check your blood sugar the morning of your surgery when you wake up and every 2 hours until you get to the Short Stay unit. . If your blood sugar is less than 70 mg/dL, you will need to treat for low blood sugar: o Do not take insulin. o Treat a low blood sugar (less than 70 mg/dL) with  cup of clear juice (cranberry or apple), 4 glucose tablets, OR glucose gel. o Recheck blood sugar in 15 minutes after treatment (to make sure it is greater than 70 mg/dL). If your blood sugar is not greater than 70 mg/dL on recheck, call [phone] for further instructions. . Report your blood sugar to the short stay nurse when you get to Short Stay.  . If you are admitted to the hospital after surgery: o Your blood sugar will be checked by the staff and you will probably be  given insulin after surgery (instead of oral diabetes medicines) to make sure you have good blood sugar levels. o The goal for blood sugar control after surgery is 80-180 mg/dL.   WHAT DO I DO ABOUT MY DIABETES MEDICATION?  Marland Kitchen Do not take oral diabetes medicines (pills) the morning of surgery.  .  .  .   Patient  Signature:  Date:   Nurse Signature:  Date:   Reviewed and Endorsed by [facility] Patient Education Committee, August 2015

## 2016-12-13 ENCOUNTER — Encounter (HOSPITAL_COMMUNITY): Payer: Self-pay

## 2016-12-13 ENCOUNTER — Encounter (HOSPITAL_COMMUNITY)
Admission: RE | Admit: 2016-12-13 | Discharge: 2016-12-13 | Disposition: A | Discharge status: Home / Self Care | Payer: Medicare (Managed Care) | Source: Ambulatory Visit | Attending: Urology | Admitting: Urology

## 2016-12-13 DIAGNOSIS — Z01812 Encounter for preprocedural laboratory examination: Secondary | ICD-10-CM | POA: Diagnosis not present

## 2016-12-13 DIAGNOSIS — R9431 Abnormal electrocardiogram [ECG] [EKG]: Secondary | ICD-10-CM | POA: Diagnosis not present

## 2016-12-13 DIAGNOSIS — N201 Calculus of ureter: Secondary | ICD-10-CM | POA: Diagnosis not present

## 2016-12-13 DIAGNOSIS — Z01810 Encounter for preprocedural cardiovascular examination: Secondary | ICD-10-CM | POA: Diagnosis present

## 2016-12-13 HISTORY — DX: Anxiety disorder, unspecified: F41.9

## 2016-12-13 HISTORY — DX: Polyneuropathy in diseases classified elsewhere: G63

## 2016-12-13 HISTORY — DX: Personal history of urinary calculi: Z87.442

## 2016-12-13 HISTORY — DX: Unspecified glaucoma: H40.9

## 2016-12-13 LAB — CBC
HEMATOCRIT: 39 % (ref 39.0–52.0)
Hemoglobin: 12.5 g/dL — ABNORMAL LOW (ref 13.0–17.0)
MCH: 30.2 pg (ref 26.0–34.0)
MCHC: 32.1 g/dL (ref 30.0–36.0)
MCV: 94.2 fL (ref 78.0–100.0)
Platelets: 207 10*3/uL (ref 150–400)
RBC: 4.14 MIL/uL — ABNORMAL LOW (ref 4.22–5.81)
RDW: 13.8 % (ref 11.5–15.5)
WBC: 6.9 10*3/uL (ref 4.0–10.5)

## 2016-12-13 LAB — BASIC METABOLIC PANEL
ANION GAP: 8 (ref 5–15)
BUN: 28 mg/dL — AB (ref 6–20)
CALCIUM: 9.7 mg/dL (ref 8.9–10.3)
CO2: 21 mmol/L — ABNORMAL LOW (ref 22–32)
Chloride: 111 mmol/L (ref 101–111)
Creatinine, Ser: 1.48 mg/dL — ABNORMAL HIGH (ref 0.61–1.24)
GFR calc Af Amer: 52 mL/min — ABNORMAL LOW (ref >60)
GFR, EST NON AFRICAN AMERICAN: 45 mL/min — AB (ref >60)
Glucose, Bld: 95 mg/dL (ref 65–99)
POTASSIUM: 3.4 mmol/L — AB (ref 3.5–5.1)
SODIUM: 140 mmol/L (ref 135–145)

## 2016-12-13 LAB — GLUCOSE, CAPILLARY: Glucose-Capillary: 131 mg/dL — ABNORMAL HIGH (ref 65–99)

## 2016-12-13 NOTE — Progress Notes (Signed)
Final Ekg done 12/13/16- EPIC

## 2016-12-13 NOTE — Progress Notes (Signed)
Patient in for prep appointment.  Pharmacy Tech in to see pt and wife.  Patient and wife are aware that medications come in a bubble pack but unaware of what medications are in each bubble pack.  Patient and wife are aware that Pradaxa comes in a separate bubble pack and patient and wife are aware to stop 4 days prior to surgery per MD instructions.  Patient states last dose on 12/12/2016 per patient.  Spoke with anesthesia ( Dr Myrtie Soman) regarding the fact that medications come in a bubble pack according to date and time of day.  Dr Kalman Shan stated for patient to take no medications am of surgery since patient and wife unaware of medications in each bubble pack and that betablocker to be given am of surgery per Dr Myrtie Soman.  Reviewed with patient and wife he is to take no medications am of surgery Medications that were listed on preop instructions sheets for wife to take home were marked thru with a permanent black marker.  Wife home with 3 sets of preop instructions.  [facility] transportation picked patient up by Lucianne Lei.  Wife assisted to car parked at [facility] by 2 RNS. And put in vehicle to go home.  Pharmacy Tech stated she called [facility] and obtained better record of medication list that is in bubble packs and placed on chart.

## 2016-12-13 NOTE — Progress Notes (Signed)
09/26/16- LOV and Device check from Cardiology on chart  [facility] Report on patient on chart

## 2016-12-14 LAB — HEMOGLOBIN A1C
Hgb A1c MFr Bld: 5.8 % — ABNORMAL HIGH (ref 4.8–5.6)
MEAN PLASMA GLUCOSE: 120 mg/dL

## 2016-12-16 MED ORDER — GENTAMICIN SULFATE 40 MG/ML IJ SOLN
120.0000 mg | INTRAVENOUS | Status: DC
  Filled 2016-12-16: qty 3

## 2016-12-16 NOTE — H&P (Signed)
HPI: Steven Duffy is a 76 year-old male with a right proximal ureteral and right ureteropelvic junction stone.  He has been experiencing hematuria without any associated pain.  He was evaluated completely for this with CT scan and cystoscopy in 2/16 and again in 7/17. The source is felt to have been from his bilateral renal calculi which are nonobstructing.   He reports to me that he is now having some pain in his back he sits on both sides but seems to be more on the left than the right. He is not having any difficulty urinating. His urine has been bloody for the past 2 weeks and his wife indicates it clears at times and then becomes dark again.     ALLERGIES: Adhesive tape Ciprofloxacin duloxetine Iodine SOLN Penicillins procaine Shellfish    MEDICATIONS: Metformin Hcl 500 mg tablet 1/2 tablet PO BID  Urocit-K 15 meq (1,620 mg) tablet, extended release 1 tablet PO BID  Acetazolamide 250 mg tablet 1 tablet PO TID  Atorvastatin Calcium 20 mg tablet  Calcium Citrate 950 MG Oral Tablet 2 Oral Every twelve hours  Digoxin 125 mcg tablet 1/2 tablet PO Daily  Diltiazem 24Hr Er 180 mg capsule, ext release 24 hr  Dorzolamide-Timolol 22.3 mg-6.8 mg/ml drops  Dulcolax Stool Softener 100 mg capsule 3 capsule PO Daily  Ferrous Sulfate 325 mg (65 mg iron) tablet  Finasteride 5 MG Oral Tablet Oral  Gabapentin 300 MG TABS Oral  Lexapro 20 MG Oral Tablet Oral  Lumigan 0.01 % Ophthalmic Solution Ophthalmic  Magnesium Oxide 400 mg tablet  Metoprolol Tartrate 25 mg tablet 1 tablet PO BID  Periguard ointment  Pilocar 1 % SOLN Ophthalmic  Pradaxa 150 MG Oral Capsule Oral  Ranitidine Hcl 300 mg tablet 1 tablet PO Q HS  Senna S 8.6 mg-50 mg tablet 1 tablet PO BID PRN  Tamsulosin HCl - 0.4 MG Oral Capsule Oral  Tylenol Extra Strength 500 mg tablet 1 tablet PO Q 4 H PRN  Vitamin D3 1,000 unit tablet  Wellbutrin SR 150 MG Oral Tablet Extended Release 12 Hour Oral     GU PSH: Catheterization For  Collection Of Specimen, Single Patient, All Places Of Service - 06/06/2016 Cysto Uretero Lithotripsy - 07/06/2015 Cystoscopy - 07/05/2016 Cystoscopy Insert Stent - 07/06/2015      PSH Notes: Cystoscopy With Ureteroscopy With Lithotripsy, Cystoscopy With Insertion Of Ureteral Stent Right, Shoulder Surgery, Eye Surgery, Pacemaker - Pulse Generator Replacement   NON-GU PSH: Brain Surgery (Unspecified) Heart Pacemaker, Insert Neck Surgery - 1967 Shoulder Arthroscopy/surgery, Bilateral - 2008    GU PMH: Kidney Stone, Bilateral, His serum studies revealed a low PTH but normal serum calcium. His 24-hour urine study showed a volume that was slightly low at 1.81 with slight hypercalciuria but significant hypocitraturia. My recommendation based on these findings is for him to increase his 24-hour fluid intake and add potassium citrate. He has been taking calcium citrate but it appears he needs more citrate to help prevent stone formation. - 08/09/2016, (Stable), He has bilateral renal calculi. The stones on the right-hand side appeared to have increased in size so I am going to evaluate him further with serum studies and a 24-hour urine and then we'll have him return to go over those results., - 07/05/2016, - 06/06/2016, - 05/14/2016, Renal calculus, bilateral, - 02/08/2016 Gross hematuria (Stable), His gross hematuria appears to be from his calculi. He did have a stone in his prosthetic urethra today and I flushed back into the bladder  but it was only a couple of millimeters in size. - 07/05/2016, - 06/06/2016 Retractile testis, Right, He was concerned about a tender area in the groin on the right hand side about half way between the top of his scrotum and the external inguinal ring. This was found to be his testicle. 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  Serum studies - no significant abnormality  24-hour urine - significant hypocitraturia with a secondary risk factor of a low total volume of 1.23 L.  Treatment: calcium citrate 20 mEq b.i.d., magnesium supplementation (OTC) and increase his fluid intake.   Bilateral renal cysts. These were noted on CT scan in 1/16.   Complete right ureteral duplication. This was noted on his CT scan and confirmed cystoscopically by the finding of 2 right ureteral orifices.   Gross hematuria: He experienced gross hematuria in 2/16. CT scan with and without contrast revealed no abnormality of the urothelium. Cystoscopic evaluation revealed no abnormality of the bladder other than BPH.   BPH: He was noted to have an enlarged prostate by cystoscopy.   IPSS 04/14/15: 15/Mixed   NON-GU PMH: Encounter for general adult medical examination without abnormal findings, Encounter for preventive health examination - 2016 Anxiety, Anxiety - 2016 Hypercholesterolemia, High cholesterol - 2016 Myocardial Infarction, History of acute myocardial infarction - 2016 Personal history of other diseases of the circulatory system, History of atrial fibrillation - 2016, History of hypertension, - 2016 Personal history of other diseases of the digestive system, History of esophageal reflux - 2016 Personal history of other diseases of the musculoskeletal system and connective tissue, History of arthritis - 2016, History of gout, - 2016 Personal history of other diseases of the nervous system and sense organs, History of glaucoma - 2016,  History of sleep apnea, - 2016 Personal history of other endocrine, nutritional and metabolic disease, History of diabetes mellitus - 2016 Personal history of other mental and behavioral disorders, History of depression - 2016 Personal history of transient ischemic attack (TIA), and cerebral infarction without residual deficits, History of stroke - 2016 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  Has never drank.  Does not drink caffeine.    REVIEW OF SYSTEMS:    GU Review Male:   Patient reports burning/ pain with urination. Patient denies frequent urination, hard to postpone urination, get up at night to urinate, leakage of urine, stream starts and stops, trouble starting your stream, have to strain to urinate , erection problems, and penile pain.  Gastrointestinal (Upper):   Patient denies nausea, vomiting, and indigestion/ heartburn.  Gastrointestinal (Lower):   Patient denies diarrhea and constipation.  Constitutional:   Patient denies fever, night sweats, weight loss, and fatigue.  Skin:   Patient denies skin rash/ lesion and itching.  Eyes:  Patient denies blurred vision and double vision.  Ears/ Nose/ Throat:   Patient denies sore throat and sinus problems.  Hematologic/Lymphatic:   Patient reports easy bruising. Patient denies swollen glands.  Cardiovascular:   Patient denies leg swelling and chest pains.  Respiratory:   Patient denies cough and shortness of breath.  Endocrine:   Patient denies excessive thirst.  Musculoskeletal:   Patient reports back pain. Patient denies joint pain.  Neurological:   Patient reports headaches. Patient denies dizziness.  Psychologic:   Patient denies depression and anxiety.   VITAL  SIGNS:    Weight 158 lb / 71.67 kg  Height 66 in / 167.64 cm  BP 108/72 mmHg  Pulse 69 /min  Temperature 97.3 F / 36 C  BMI 25.5 kg/m   MULTI-SYSTEM PHYSICAL EXAMINATION:    Constitutional: Mild physical deformities. Well-nourished. Normally developed. Good grooming.   Respiratory: No labored breathing, no use of accessory muscles.   Cardiovascular: Normal temperature, normal extremity pulses, no swelling, no varicosities.   Skin: No paleness, no jaundice, no cyanosis. No lesion, no ulcer, no rash.   Neurologic / Psychiatric: Oriented to time, oriented to place, oriented to person. No depression, no anxiety, no agitation.   Gastrointestinal: No hernia. No mass, no tenderness, no rigidity, non obese abdomen.   Musculoskeletal: Spine, ribs, pelvis no bilateral tenderness. Normal gait and station of head and neck.   PAST DATA REVIEWED:  Source Of History:  Patient  Lab Test Review:   BUN/Creatinine  Records Review:   Previous Doctor Records, Previous Patient Records, POC Tool  Notes:                     His creatinine in 9/17 was 1.35.   EXAM:  CT ABDOMEN AND PELVIS WITHOUT CONTRAST   TECHNIQUE:  Multidetector CT imaging of the abdomen and pelvis was performed  following the standard protocol without IV contrast. Sagittal and  coronal MPR images reconstructed from axial data set. Oral contrast  not administered for this indication.   COMPARISON: 05/26/2015 ; abdominal radiograph of 05/14/2016   FINDINGS:  Lower chest: Lung bases clear   Hepatobiliary: Tiny nonspecific low-attenuation focus 7 mm diameter  anterior aspect medial segment LEFT lobe liver image 13. Liver and  gallbladder otherwise normal appearance   Pancreas: Normal appearance   Spleen: Normal appearance. Small splenule at splenic hilum.   Adrenals/Urinary Tract: Adrenal glands normal appearance. BILATERAL  nonobstructing renal calculi up to 16 mm diameter at mid upper RIGHT  kidney image 27. No definite  renal mass. No hydronephrosis or  ureteral dilatation. Bladder unremarkable. Upper normal prostate  gland size.   Stomach/Bowel: Appendix not definitely localized. Minimally  prominent stool rectum. Few distal colonic diverticula without  evidence of diverticulitis. Stomach and remaining bowel loops  unremarkable   Vascular/Lymphatic: Scattered atherosclerotic calcifications aorta  and abdominal branches without aneurysm. No adenopathy. Pacemaker  lead RIGHT ventricle.   Reproductive: N/A   Other: No free air or free fluid. No hernia.   Musculoskeletal: BILATERAL spondylolysis L5 without significant  spondylolisthesis. Scattered degenerative disc disease changes.  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   IMPRESSION:  BILATERAL nonobstructing renal calculi larger on RIGHT.   No definite acute intra-abdominal or intrapelvic abnormalities.   Aortic atherosclerosis.   No cause for gross hematuria is identified.     PROCEDURES:         KUB - 74000  A single view of the abdomen is obtained.  There is a great deal of gas overlying the left kidney but I cannot definitely see any stones along the course of the left ureter. I don't see any stones along the course of the right ureter but comparing his KUB today with a previous KUB it appears there may be a stone located near the ureteropelvic junction. It's hard to tell.         Urinalysis w/Scope Dipstick Dipstick Cont'd Micro  Color: Red Bilirubin: Neg WBC/hpf: 0 - 5/hpf  Appearance: Cloudy Ketones: Neg RBC/hpf: >60/hpf  Specific Gravity: 1.025 Blood: 3+ Bacteria: Rare (0-9/hpf)  pH: 6.5 Protein: 1+ Cystals: NS (Not Seen)  Glucose: Neg Urobilinogen: 0.2 Casts: NS (Not Seen)    Nitrites: Neg Trichomonas: Not Present    Leukocyte Esterase: Trace Mucous: Not Present      Epithelial Cells: 0 - 5/hpf      Yeast: NS (Not Seen)      Sperm: Not Present       ASSESSMENT:      ICD-10 Details  1 GU:   Gross hematuria -  R31.0 Worsening - I checked H&H as well as a creatinine. I will then see if his antiplatelet therapy can be stopped and then will have him back For reassessment in a week with a CT scan at that time.  2   Kidney Stone - N20.0 Bilateral, Stable - We did discuss the fact that if it appears there is a stone located at the ureteropelvic junction on the right hand side as well as a stone within the proximal right ureter with no change on the left he could potentially be scheduled for ureteroscopy of those 2 stones without addressing all of the other stones in his kidney and this could therefore reduce the invasiveness by avoiding a percutaneous nephrolithotomy since the stones within his kidney or peripherally located and not causing obstruction.              Notes:   we discussed the fact that his hematuria is almost certainly from his stone since he has been evaluated with both CT scan and cystoscopy on 2 occasions in 2/16 and 7/17 both times he was not found to have any form of tumors or other cause for his hematuria however he has a stone located in his proximal ureter on the right-hand side as well as a renal pelvic stone. He remains on Pradaxa and reports he has stopped it in the past. It would be helpful if he could stop it for a short time but I will need to check with his cardiologist before doing this.   He has a fairly large stone burden on the right hand side and a smaller one on the left but I cannot be sure which side is causing his continued intermittent gross hematuria . Managing the stones on the right-hand side would require a percutaneous nephrolithotomy and likely, because of the location of the stones throughout the kidney he would almost certainly require a staged procedure. He reports that he has been having pain on the left hand side.   A urine culture was performed on 12/07/16 in preparation for surgery and found to be negative.   PLAN: 1.  His Pradaxa has been stopped prior to surgery  with clearance from his radiologist. 2. Cystoscopy with right retrograde pyelogram, right ureteroscopy and laser lithotripsy of his right ureteral and renal pelvic stone with stent placement.

## 2016-12-16 NOTE — Discharge Instructions (Signed)

## 2016-12-16 NOTE — H&P (Deleted)
  The note originally documented on this encounter has been moved the the encounter in which it belongs.  

## 2016-12-17 ENCOUNTER — Encounter (HOSPITAL_COMMUNITY): Admission: RE | Payer: Self-pay | Source: Ambulatory Visit

## 2016-12-17 ENCOUNTER — Ambulatory Visit (HOSPITAL_COMMUNITY): Admission: RE | Admit: 2016-12-17 | Payer: Medicare (Managed Care) | Source: Ambulatory Visit | Admitting: Urology

## 2016-12-17 SURGERY — CYSTOSCOPY/URETEROSCOPY/HOLMIUM LASER/STENT PLACEMENT
Anesthesia: General | Laterality: Right

## 2016-12-17 NOTE — Progress Notes (Signed)
Spoke with Mr. Gresham's wife at [address]) about arrival to Short Stay. She stated their transportation had not arrived and will leave ASAP. Notified [place] in [place]. She called Dr. Karsten Ro and he stated Mr. Lust would need to be rescheduled. Notified patient's wife they would need to call the office and rescheduled. She verbalized understanding.

## 2016-12-18 ENCOUNTER — Other Ambulatory Visit: Payer: Self-pay | Admitting: Urology

## 2016-12-28 NOTE — Telephone Encounter (Signed)
Received a [employer] Exception form from [facility] for the patient's prescription of Mometsone Furoate. Form completed and faxed back to [facility].

## 2017-01-01 MED ORDER — LOVASTATIN 40 MG PO TABS
40 MG | ORAL_TABLET | Freq: Every evening | ORAL | 3 refills | Status: DC
Start: 2017-01-01 — End: 2017-01-04

## 2017-01-01 MED ORDER — FLUOXETINE HCL 40 MG PO CAPS
40 MG | ORAL_CAPSULE | Freq: Every day | ORAL | 3 refills | Status: DC
Start: 2017-01-01 — End: 2017-01-04

## 2017-01-01 NOTE — Telephone Encounter (Signed)
PHARMACY REQUESTING REFILLS. PLEASE ADVISE    LAST SEEN 10/05/16

## 2017-01-03 MED ORDER — GENTAMICIN SULFATE 40 MG/ML IJ SOLN
400.0000 mg | INTRAVENOUS | Status: AC
  Administered 2017-01-04: 400 mg via INTRAVENOUS
  Filled 2017-01-03: qty 10

## 2017-01-04 ENCOUNTER — Encounter (HOSPITAL_COMMUNITY)
Admission: RE | Disposition: A | Discharge status: Home / Self Care | Payer: Self-pay | Source: Ambulatory Visit | Attending: Urology

## 2017-01-04 ENCOUNTER — Ambulatory Visit (HOSPITAL_COMMUNITY)
Admission: RE | Admit: 2017-01-04 | Discharge: 2017-01-04 | Disposition: A | Discharge status: Home / Self Care | Payer: Medicare (Managed Care) | Source: Ambulatory Visit | Attending: Urology | Admitting: Urology

## 2017-01-04 ENCOUNTER — Encounter (HOSPITAL_COMMUNITY): Payer: Self-pay

## 2017-01-04 ENCOUNTER — Ambulatory Visit (HOSPITAL_COMMUNITY): Payer: Medicare (Managed Care) | Admitting: Anesthesiology

## 2017-01-04 ENCOUNTER — Ambulatory Visit (HOSPITAL_COMMUNITY): Payer: Medicare (Managed Care)

## 2017-01-04 ENCOUNTER — Ambulatory Visit: Admit: 2017-01-04 | Discharge: 2017-01-04 | Payer: MEDICARE | Attending: Family Medicine | Primary: Family Medicine

## 2017-01-04 DIAGNOSIS — E119 Type 2 diabetes mellitus without complications: Secondary | ICD-10-CM | POA: Diagnosis not present

## 2017-01-04 DIAGNOSIS — G4730 Sleep apnea, unspecified: Secondary | ICD-10-CM | POA: Diagnosis not present

## 2017-01-04 DIAGNOSIS — G839 Paralytic syndrome, unspecified: Secondary | ICD-10-CM | POA: Diagnosis not present

## 2017-01-04 DIAGNOSIS — N400 Enlarged prostate without lower urinary tract symptoms: Secondary | ICD-10-CM | POA: Diagnosis not present

## 2017-01-04 DIAGNOSIS — Z79899 Other long term (current) drug therapy: Secondary | ICD-10-CM | POA: Insufficient documentation

## 2017-01-04 DIAGNOSIS — I4891 Unspecified atrial fibrillation: Secondary | ICD-10-CM | POA: Insufficient documentation

## 2017-01-04 DIAGNOSIS — Z87891 Personal history of nicotine dependence: Secondary | ICD-10-CM | POA: Diagnosis not present

## 2017-01-04 DIAGNOSIS — K219 Gastro-esophageal reflux disease without esophagitis: Secondary | ICD-10-CM | POA: Diagnosis not present

## 2017-01-04 DIAGNOSIS — F329 Major depressive disorder, single episode, unspecified: Secondary | ICD-10-CM | POA: Diagnosis not present

## 2017-01-04 DIAGNOSIS — N201 Calculus of ureter: Secondary | ICD-10-CM

## 2017-01-04 DIAGNOSIS — I69364 Other paralytic syndrome following cerebral infarction affecting left non-dominant side: Secondary | ICD-10-CM | POA: Insufficient documentation

## 2017-01-04 DIAGNOSIS — I10 Essential (primary) hypertension: Secondary | ICD-10-CM | POA: Insufficient documentation

## 2017-01-04 DIAGNOSIS — I252 Old myocardial infarction: Secondary | ICD-10-CM | POA: Insufficient documentation

## 2017-01-04 DIAGNOSIS — Z950 Presence of cardiac pacemaker: Secondary | ICD-10-CM | POA: Diagnosis not present

## 2017-01-04 DIAGNOSIS — F419 Anxiety disorder, unspecified: Secondary | ICD-10-CM | POA: Diagnosis not present

## 2017-01-04 DIAGNOSIS — Z7901 Long term (current) use of anticoagulants: Secondary | ICD-10-CM | POA: Diagnosis not present

## 2017-01-04 DIAGNOSIS — E7800 Pure hypercholesterolemia, unspecified: Secondary | ICD-10-CM | POA: Insufficient documentation

## 2017-01-04 DIAGNOSIS — N202 Calculus of kidney with calculus of ureter: Secondary | ICD-10-CM | POA: Diagnosis not present

## 2017-01-04 DIAGNOSIS — Q625 Duplication of ureter: Secondary | ICD-10-CM | POA: Diagnosis not present

## 2017-01-04 DIAGNOSIS — Z7984 Long term (current) use of oral hypoglycemic drugs: Secondary | ICD-10-CM | POA: Insufficient documentation

## 2017-01-04 DIAGNOSIS — H409 Unspecified glaucoma: Secondary | ICD-10-CM | POA: Diagnosis not present

## 2017-01-04 HISTORY — PX: CYSTOSCOPY/RETROGRADE/URETEROSCOPY/STONE EXTRACTION WITH BASKET: SHX5317

## 2017-01-04 LAB — BASIC METABOLIC PANEL
Anion gap: 6 (ref 5–15)
BUN: 21 mg/dL — AB (ref 6–20)
CALCIUM: 9.6 mg/dL (ref 8.9–10.3)
CO2: 21 mmol/L — ABNORMAL LOW (ref 22–32)
CREATININE: 1.25 mg/dL — AB (ref 0.61–1.24)
Chloride: 114 mmol/L — ABNORMAL HIGH (ref 101–111)
GFR calc Af Amer: >60 mL/min (ref >60)
GFR, EST NON AFRICAN AMERICAN: 55 mL/min — AB (ref >60)
Glucose, Bld: 115 mg/dL — ABNORMAL HIGH (ref 65–99)
POTASSIUM: 3.6 mmol/L (ref 3.5–5.1)
SODIUM: 141 mmol/L (ref 135–145)

## 2017-01-04 LAB — GLUCOSE, CAPILLARY
Glucose-Capillary: 111 mg/dL — ABNORMAL HIGH (ref 65–99)
Glucose-Capillary: 117 mg/dL — ABNORMAL HIGH (ref 65–99)

## 2017-01-04 LAB — APTT: APTT: 38 s — AB (ref 24–36)

## 2017-01-04 SURGERY — CYSTOSCOPY, WITH CALCULUS REMOVAL USING BASKET
Anesthesia: General | Laterality: Right

## 2017-01-04 MED ORDER — LOVASTATIN 40 MG PO TABS
40 | ORAL_TABLET | Freq: Every evening | ORAL | 3 refills | Status: DC
Start: 2017-01-04 — End: 2017-12-07

## 2017-01-04 MED ORDER — LISINOPRIL 20 MG PO TABS
20 | ORAL_TABLET | Freq: Every day | ORAL | 3 refills | Status: DC
Start: 2017-01-04 — End: 2017-12-07

## 2017-01-04 MED ORDER — FLUOXETINE HCL 40 MG PO CAPS
40 | ORAL_CAPSULE | Freq: Every day | ORAL | 3 refills | Status: DC
Start: 2017-01-04 — End: 2017-12-07

## 2017-01-04 MED ORDER — PROMETHAZINE HCL 25 MG/ML IJ SOLN: 6.2500 mg | INTRAMUSCULAR | Status: DC | PRN

## 2017-01-04 MED ORDER — PROPOFOL 10 MG/ML IV BOLUS
INTRAVENOUS | Status: AC
  Filled 2017-01-04: qty 20

## 2017-01-04 MED ORDER — DEXAMETHASONE SODIUM PHOSPHATE 10 MG/ML IJ SOLN
INTRAMUSCULAR | Status: DC | PRN
Start: 2017-01-04 — End: 2017-01-04
  Administered 2017-01-04: 8 mg via INTRAVENOUS

## 2017-01-04 MED ORDER — HYDROCODONE-ACETAMINOPHEN 10-325 MG PO TABS: 1.0000 | ORAL_TABLET | ORAL | 0 refills | Status: DC | PRN

## 2017-01-04 MED ORDER — IOHEXOL 300 MG/ML  SOLN
INTRAMUSCULAR | Status: DC | PRN
  Administered 2017-01-04: 50 mL via INTRAVENOUS

## 2017-01-04 MED ORDER — 0.9 % SODIUM CHLORIDE (POUR BTL) OPTIME
TOPICAL | Status: DC | PRN
  Administered 2017-01-04: 1000 mL

## 2017-01-04 MED ORDER — VANCOMYCIN HCL IN DEXTROSE 1-5 GM/200ML-% IV SOLN
1000.0000 mg | INTRAVENOUS | Status: AC
  Administered 2017-01-04: 1000 mg via INTRAVENOUS

## 2017-01-04 MED ORDER — ONDANSETRON HCL 4 MG/2ML IJ SOLN
INTRAMUSCULAR | Status: DC | PRN
  Administered 2017-01-04: 4 mg via INTRAVENOUS

## 2017-01-04 MED ORDER — PHENYLEPHRINE HCL 10 MG/ML IJ SOLN
INTRAMUSCULAR | Status: DC | PRN
  Administered 2017-01-04 (×3): 40 ug via INTRAVENOUS

## 2017-01-04 MED ORDER — LIDOCAINE HCL (CARDIAC) 20 MG/ML IV SOLN
INTRAVENOUS | Status: DC | PRN
  Administered 2017-01-04 (×2): 80 mg via INTRAVENOUS

## 2017-01-04 MED ORDER — DEXAMETHASONE SODIUM PHOSPHATE 10 MG/ML IJ SOLN
INTRAMUSCULAR | Status: AC
  Filled 2017-01-04: qty 1

## 2017-01-04 MED ORDER — ONDANSETRON HCL 4 MG/2ML IJ SOLN
INTRAMUSCULAR | Status: AC
  Filled 2017-01-04: qty 2

## 2017-01-04 MED ORDER — LACTATED RINGERS IV SOLN
INTRAVENOUS | Status: DC | PRN
  Administered 2017-01-04 (×2): via INTRAVENOUS

## 2017-01-04 MED ORDER — PHENAZOPYRIDINE HCL 200 MG PO TABS: 200.0000 mg | ORAL_TABLET | Freq: Three times a day (TID) | ORAL | 0 refills | Status: DC | PRN

## 2017-01-04 MED ORDER — PROPOFOL 10 MG/ML IV BOLUS
INTRAVENOUS | Status: DC | PRN
  Administered 2017-01-04: 25 mg via INTRAVENOUS
  Administered 2017-01-04: 150 mg via INTRAVENOUS

## 2017-01-04 MED ORDER — LIDOCAINE 2% (20 MG/ML) 5 ML SYRINGE
INTRAMUSCULAR | Status: AC
  Filled 2017-01-04: qty 5

## 2017-01-04 MED ORDER — PHENAZOPYRIDINE HCL 200 MG PO TABS
200.0000 mg | ORAL_TABLET | Freq: Three times a day (TID) | ORAL | Status: AC
  Administered 2017-01-04: 200 mg via ORAL
  Filled 2017-01-04: qty 1

## 2017-01-04 MED ORDER — MIDAZOLAM HCL 2 MG/2ML IJ SOLN
INTRAMUSCULAR | Status: AC
  Filled 2017-01-04: qty 2

## 2017-01-04 MED ORDER — FENTANYL CITRATE (PF) 100 MCG/2ML IJ SOLN: 25.0000 ug | INTRAMUSCULAR | Status: DC | PRN

## 2017-01-04 MED ORDER — VANCOMYCIN HCL IN DEXTROSE 1-5 GM/200ML-% IV SOLN
INTRAVENOUS | Status: AC
  Filled 2017-01-04: qty 200

## 2017-01-04 MED ORDER — FENTANYL CITRATE (PF) 100 MCG/2ML IJ SOLN
INTRAMUSCULAR | Status: AC
  Filled 2017-01-04: qty 2

## 2017-01-04 MED ORDER — KETOROLAC TROMETHAMINE 30 MG/ML IJ SOLN: 30.0000 mg | Freq: Once | INTRAMUSCULAR | Status: DC | PRN

## 2017-01-04 MED ORDER — SODIUM CHLORIDE 0.9 % IR SOLN
Status: DC | PRN
  Administered 2017-01-04: 4000 mL

## 2017-01-04 MED ORDER — FENTANYL CITRATE (PF) 100 MCG/2ML IJ SOLN
INTRAMUSCULAR | Status: DC | PRN
  Administered 2017-01-04: 25 ug via INTRAVENOUS
  Administered 2017-01-04: 50 ug via INTRAVENOUS
  Administered 2017-01-04: 25 ug via INTRAVENOUS

## 2017-01-04 SURGICAL SUPPLY — 18 items
BAG URO CATCHER STRL LF (MISCELLANEOUS) ×2 IMPLANT
BASKET ZERO TIP 1.9FR (BASKET) ×2 IMPLANT
CATH INTERMIT  6FR 70CM (CATHETERS) ×2 IMPLANT
CLOTH BEACON ORANGE TIMEOUT ST (SAFETY) ×2 IMPLANT
FIBER LASER TRAC TIP (UROLOGICAL SUPPLIES) ×2 IMPLANT
GLOVE BIOGEL M 8.0 STRL (GLOVE) ×2 IMPLANT
GOWN STRL REUS W/TWL XL LVL3 (GOWN DISPOSABLE) ×2 IMPLANT
GUIDEWIRE STR DUAL SENSOR (WIRE) ×4 IMPLANT
IV NS 1000ML (IV SOLUTION) ×1
IV NS 1000ML BAXH (IV SOLUTION) ×1 IMPLANT
MANIFOLD NEPTUNE II (INSTRUMENTS) ×2 IMPLANT
PACK CYSTO (CUSTOM PROCEDURE TRAY) ×2 IMPLANT
SHEATH ACCESS URETERAL 24CM (SHEATH) IMPLANT
SHEATH ACCESS URETERAL 38CM (SHEATH) ×2 IMPLANT
SHEATH ACCESS URETERAL 54CM (SHEATH) IMPLANT
STENT CONTOUR 6FRX24X.038 (STENTS) ×2 IMPLANT
STENT CONTOUR 6FRX26X.038 (STENTS) ×2 IMPLANT
TUBING CONNECTING 10 (TUBING) ×2 IMPLANT

## 2017-01-04 NOTE — Progress Notes (Signed)
Chief Complaint   Patient presents with   ??? Hypertension     3 mo f/u   ??? Medication Refill   Blood pressure is relatively stable at this time  no cp/sob  bp stable    Obese condition still continues  should lose weight    oa well controlled at this time  Stable pain pattern    Mood is controlled for now  No suicidal/homocidal ideation. No psychotic or manic symptoms.    Hi chol continues  Watches diet       Patient Active Problem List   Diagnosis   ??? Dermatitis   ??? Essential hypertension   ??? Primary osteoarthritis involving multiple joints   ??? 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    Past Medical History:   Diagnosis Date   ??? Anxiety    ??? Diverticulitis    ??? Hand eczema        Past Surgical History:   Procedure Laterality Date   ??? COLONOSCOPY  12/31/07    DR FLEITES   ??? TONSILLECTOMY         family history includes Heart Disease in his mother; Liver Cancer in his father; Prostate Cancer in his brother.    Social History     Social History   ??? Marital status: Married     Spouse name: N/A   ??? Number of children: N/A   ??? Years of education: N/A     Occupational History   ??? Not on file.     Social History Main Topics   ??? Smoking status: Former Smoker     Packs/day: 1.00     Years: 40.00     Quit date: 12/01/1995   ??? Smokeless tobacco: Never Used   ??? Alcohol use Not on file   ??? Drug use: Unknown   ??? Sexual activity: Not on file     Other Topics Concern   ??? Not on file     Social History Narrative   ??? No narrative on file       No Known Allergies    Review of Systems - General ROS: negative  Psychological ROS: negative  ENT ROS: negative  Hematological and Lymphatic ROS: negative  Endocrine ROS: negative  Respiratory ROS: no cough, shortness of breath, or wheezing  Cardiovascular ROS: no chest pain or dyspnea on exertion  Gastrointestinal ROS: no abdominal pain,  change in bowel habits, or black or bloody stools  Genito-Urinary ROS: no dysuria, trouble voiding, or hematuria  Musculoskeletal ROS: negative  Neurological ROS: no TIA or stroke symptoms  Dermatological ROS: Lesion    Blood pressure 138/80, pulse 66, temperature 97.5 ??F (36.4 ??C), temperature source Temporal, resp. rate 16, height 5\' 11"  (1.803 m), weight 253 lb (114.8 kg), SpO2 95 %.    Physical Examination: General appearance - alert, well appearing, and in no distress  Mental status - alert, oriented to person, place, and time  Eyes - pupils equal and reactive, extraocular eye movements intact  Ears - bilateral TM's and external ear canals normal  Mouth - mucous membranes moist, pharynx normal without lesions  Neck - supple, no significant adenopathy  Lymphatics - no palpable lymphadenopathy, no hepatosplenomegaly  Chest - clear to auscultation, no wheezes, rales or rhonchi, symmetric air entry  Heart - normal rate, regular rhythm, normal S1, S2,  no murmurs, rubs, clicks or gallops  Abdomen - soft, nontender, nondistended, no masses or organomegaly, obese  Neurological - alert, oriented, normal speech, no focal findings or movement disorder noted  Musculoskeletal - no joint tenderness, deformity or swelling  Extremities - peripheral pulses normal, no pedal edema, no clubbing or cyanosis  Skin - skin lesion right arm, possible senile macule versus nevus, otherwise dermatitis, o/w normal coloration and turgor, no rashes, no suspicious skin lesions noted;    Assessment:    1. Essential hypertension     2. Primary osteoarthritis involving multiple joints     3. 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 Dermatitis         Plan:    No orders of the defined types were placed in this encounter.      Outpatient Encounter Prescriptions as of 01/04/2017   Medication Sig Dispense Refill   ??? FLUoxetine (PROZAC) 40 MG capsule Take 1 capsule by mouth daily 90 capsule 3   ??? lovastatin (MEVACOR) 40 MG tablet Take 1 tablet  by mouth nightly 90 tablet 3   ??? lisinopril (PRINIVIL;ZESTRIL) 20 MG tablet Take 1 tablet by mouth daily 90 tablet 3   ??? aspirin EC 81 MG EC tablet Take 1 tablet by mouth daily 30 tablet 3   ??? mometasone (ELOCON) 0.1 % cream Apply topically daily. 45 g 3   ??? [DISCONTINUED] FLUoxetine (PROZAC) 40 MG capsule Take 1 capsule by mouth daily 90 capsule 3   ??? [DISCONTINUED] lovastatin (MEVACOR) 40 MG tablet Take 1 tablet by mouth nightly 90 tablet 3   ??? [DISCONTINUED] lisinopril (PRINIVIL;ZESTRIL) 20 MG tablet TAKE 1 TABLET BY MOUTH DAILY 90 tablet 2   ??? naproxen (NAPROSYN) 500 MG tablet Take 1 tablet by mouth 2 times daily (with meals) 180 tablet 3     No facility-administered encounter medications on file as of 01/04/2017.      Recommend lifestyle modification.  Report persistence of ecchymosis  Weight loss    Return in about 3 months (around 04/04/2017).    Patient education provided. They understand and agree with this course of treatment. They will return with new or worsening symptoms. Patient instructed to remain current with appropriate annual health maintenance.

## 2017-01-04 NOTE — Patient Instructions (Signed)
Patient Education        When You Are Overweight: Care Instructions  Your Care Instructions    If you're overweight, your doctor may recommend that you make changes in your eating and exercise habits. Being overweight can lead to serious health problems, such as high blood pressure, heart disease, type 2 diabetes, and arthritis, or it can make these problems worse. Eating a healthy diet and being more active can help you reach and stay at a healthy weight.  You don't have to make huge changes all at once. Start by making small changes in your eating and exercise habits. To lose weight, you need to burn more calories than you take in. You can do this by eating healthy foods in reasonable amounts and becoming more active every day.  Follow-up care is a key part of your treatment and safety. Be sure to make and go to all appointments, and call your doctor if you are having problems. It's also a good idea to know your test results and keep a list of the medicines you take.  How can you care for yourself at home?  ?? Improve your eating habits. You'll be more successful if you work on changing one eating habit at a time. All foods, if eaten in moderation, can be part of healthy eating. Remember to:  ?? Eat a variety of foods from each food group. Include grains, vegetables, fruits, dairy, and protein foods.  ?? Limit foods high in fat, sugar, and calories.  ?? Eat slowly. And don't do anything else, such as watch TV, while you are eating.  ?? Pay attention to portion sizes. Put your food on a smaller plate.  ?? Plan your meals ahead of time. You'll be less likely to grab something that's not as healthy.  ?? Get active. Regular activity can help you feel better, have more energy, and burn more calories. If you haven't been active, start slowly. Start with at least 30 minutes of moderate activity on most days of the week. Then gradually increase the amount of activity. Try for 60 or 90 minutes a day, at least 5 days a week.  There are a lot of ways to fit activity into your life. You can:  ?? Walk or bike to the store. Or walk with a friend, or walk the dog.  ?? Mow the lawn, rake leaves, shovel snow, or do some gardening.  ?? Use the stairs instead of the elevator, at least for a few floors.  ?? Change your thinking. Your thoughts have a lot to do with how you feel and what you do. When you're trying to reach a healthy weight, changing how you think about certain things may help. Here are some ideas:  ?? Don't compare yourself to others. Healthy bodies come in all shapes and sizes.  ?? Pay attention to how hungry or full you feel. When you eat, be aware of why you're eating and how much you're eating.  ?? Focus on improving your health instead of dieting. Dieting almost never works over the long term.  ?? Ask your doctor about other health professionals who can help you reach a healthy weight.  ?? A dietitian can help you make healthy changes in your diet.  ?? An exercise specialist or personal trainer can help you develop a safe and effective exercise program.  ?? A counselor or psychiatrist can help you cope with issues such as depression, anxiety, or family problems that can make it hard   to focus on reaching a healthy weight.  ?? Get support from your family, your doctor, your friends, a support group-and support yourself.  Where can you learn more?  Go to [email or web address] and sign in to your MyChart account. Enter L869 in the Search Health Information box to learn more about "When You Are Overweight: Care Instructions."     If you do not have an account, please click on the "Sign Up Now" link.  Current as of: September 22, 2015  Content Version: 11.5  ?? 2006-2017 [employer]. Care instructions adapted under license by [facility]. If you have questions about a medical condition or this instruction, always ask your healthcare professional. [employer] disclaims any warranty or liability for your  use of this information.

## 2017-01-04 NOTE — Anesthesia Postprocedure Evaluation (Addendum)
Anesthesia Post Note  Patient: Steven Duffy  Procedure(s) Performed: Procedure(s) (LRB): CYSTOSCOPY/RETROGRADE/URETEROSCOPY/HOLMIUM LASER/STONE EXTRACTION WITH BASKET/ DOUBLE J STENT (Right)  Patient location during evaluation: PACU Anesthesia Type: General Level of consciousness: awake and alert Pain management: pain level controlled Vital Signs Assessment: post-procedure vital signs reviewed and stable Respiratory status: spontaneous breathing, nonlabored ventilation, respiratory function stable and patient connected to nasal cannula oxygen Cardiovascular status: blood pressure returned to baseline and stable Postop Assessment: no signs of nausea or vomiting Anesthetic complications: no       Last Vitals:  Vitals:   01/04/17 0908 01/04/17 0915  BP: 133/87 (!) 141/87  Pulse: 86 76  Resp:  14  Temp: 36.3 C     Last Pain:  Vitals:   01/04/17 0545  TempSrc: Oral                 Tell Rozelle S

## 2017-01-04 NOTE — Discharge Instructions (Signed)
On arrival home after surgery you will need to transmit with [facility] Monitor.   Drink plenty of water to get urine to clear up . You may have some blood in your urine as long as the stents are in . Call the doctor for pain not relieved by medication, fever 101 or greater inability to urinate or gross bloody urine with clots.    Post stone removal/stent placement surgery instructions   Definitions:  Ureter: The duct that transports urine from the kidney to the bladder. Stent: A plastic hollow tube that is placed into the ureter, from the kidney to the bladder to prevent the ureter from swelling shut.  General instructions:  Despite the fact that no skin incisions were used, the area around the ureter and bladder is raw and irritated. The stent is a foreign body which will further irritate the bladder wall. This irritation is manifested by increased frequency of urination, both day and night, and by an increase in the urge to urinate. In some, the urge to urinate is present almost always. Sometimes the urge is strong enough that you may not be able to stop your self from urinating. The only real cure is to remove the stent and then give time for the bladder wall to heal which can't be done until the danger of the ureter swelling shut has passed. (This varies from 2-21 days).  You may see some blood in your urine while the stent is in place and a few days afterward. Do not be alarmed, even if the urine is clear for a while. Get off your feet and drink lots of fluids until clearing occurs. If you start to pass clots or don't improve, call us.  If you have a string coming from your urethra:  The stent string is attached to your ureteral stent.  Do not pull on thisIf you have a string coming from your urethra:  The stent string is attached to your ureteral stent.  Do not pull on this.  Diet:  You may return to your normal diet immediately. Because of the raw surface of your bladder, alcohol, spicy  foods, foods high in acid and drinks with caffeine may cause irritation or frequency and should be used in moderation. To keep your urine flowing freely and avoid constipation, drink plenty of fluids during the day (8-10 glasses). Tip: Avoid cranberry juice because it is very acidic.  Activity:  Your physical activity doesn't need to be restricted. However, if you are very active, you may see some blood in the urine. We suggest that you reduce your activity under the circumstances until the bleeding has stopped.  Bowels:  It is important to keep your bowels regular during the postoperative period. Straining with bowel movements can cause bleeding. A bowel movement every other day is reasonable. Use a mild laxative if needed, such as milk of magnesia 2-3 tablespoons, or 2 Dulcolax tablets. Call if you continue to have problems. If you had been taking narcotics for pain, before, during or after your surgery, you may be constipated. Take a laxative if necessary.     Medication:  You should resume your pre-surgery medications unless told not to. DO NOT RESUME YOUR ASPIRIN, or any other medicines like ibuprofen, motrin, excedrin, advil, aleve, vitamin E, fish oil as these can all cause bleeding x 7 days. In addition you may be given an antibiotic to prevent or treat infection. Antibiotics are not always necessary. All medication should be taken as prescribed until  the bottles are finished unless you are having an unusual reaction to one of the drugs.  Problems you should report to [place]:  a. Fever greater than 101F. b. Heavy bleeding, or clots (see notes above about blood in urine). c. Inability to urinate. d. Drug reactions (hives, rash, nausea, vomiting, diarrhea). e. Severe burning or pain with urination that is not improving.  Followup:  You will need a followup appointment to monitor your progress in most cases. Please call the office for this appointment when you get home if your  appointment has not already been scheduled. Usually the first appointment will be about 5-14 days after your surgery and if you have a stent in place it will likely be removed at that time.

## 2017-01-04 NOTE — Op Note (Signed)
PATIENT:  Steven Duffy  PRE-OPERATIVE DIAGNOSIS: 1.  right Ureteral calculus 2. Right renal calculi  POST-OPERATIVE DIAGNOSIS: 1. Right upper moiety renal calculus 2. Right lower morbidity ureteral calculus 3. Right lower moiety renal calculi.  PROCEDURE:  1. Cystoscopy with right upper and lower moiety retrograde pyelograms with interpretation 2. Right upper moiety ureteroscopy, laser lithotripsy and stent placement 3. Right lower moiety ureteroscopy, ureteral laser lithotripsy and renal laser lithotripsy of multiple calculi with stent placement 4. Fluoroscopy greater than 1 hour and less than 2 hours   SURGEON: Claybon Jabs, MD  INDICATION: Mr. Salehi has been experiencing intermittent pain as well as intermittent gross hematuria. He is a CT scan that revealed stones within the upper middle and lower poles of his right kidney as well as a stone in the right ureter. We discussed surgical management with ureteroscopy to address the stones and he has elected to proceed.  ANESTHESIA:  General  EBL:  Minimal  DRAINS: 1. 6 French 26 cm double-J stent in the upper pole moiety on the right. (No external string but tagged with a portion of black suture for identification cystoscopically) 2. 6 French, 24 cm double-J stent in the right lower pole moiety (no string)  SPECIMEN:   none  DESCRIPTION OF PROCEDURE: The patient was taken to the major OR and placed on the table. General anesthesia was administered and then the patient was moved to the dorsal lithotomy position. The genitalia was sterilely prepped and draped. An official timeout was performed.  Initially the 21 French cystoscope with 30 lens was passed under direct vision into the bladder. The prostate was noted be unremarkable.  The bladder was then fully inspected. It was noted be free of any tumors, stones or inflammatory lesions. Ureteral orifices were of normal configuration and position. A 6 French open-ended ureteral  catheter was then passed through the cystoscope into the ureteral orifice in order to perform a right retrograde pyelogram.  A retrograde pyelogram was performed by injecting full-strength contrast up the right ureter under direct fluoroscopic control. It revealed a filling defect in the upper pole consistent with the stone seen on theprevious CT scan . The remainder of the ureter was noted to be normal as was the intrarenal collecting system. 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  I then passed a 0.038 inch floppy-tipped guidewire through the open ended catheter and into the area of the renal pelvis and this was left in place. The ureteral access sheath was then passed over the guidewire and the inner portion of the access sheath and guidewire were removed.   A 6 Fredigital, flexible ureteroscope was then passed through the access sheath and into the area of the renal pelvis.  The stone was identified in the upper pole calyx and I felt it was too large to extract and therefore elected to proceed with laser lithotripsy. The 200  holmium laser fiber was used to fragment the stone into pieces that were all less than 1 mm in size. After completely "dusting" the stone I reinserted a guidewire and left this in place and removed the ureteroscope and the access sheath.  I then proceeded with repeat cystoscopy and identified a second ureteral orifice that was not initially apparent. I cannulated the orifice with a 6 [place] open-ended ureteral catheter and then injected full-strength Omnipaque contrast under direct fluoroscopy and this revealed this was the ureter associated  with the lower pole moiety of the right kidney. There was a filling defect in the proximal ureter consistent with the stone seen on his previous CT scan as well as filling defects within the calyces consistent with the stones there were also previously  noted on his CT scan. I therefore passed a 0.038 inch floppy-tipped guidewire through the open-ended catheter and into the lower pole and left this in place and removed the cystoscope. I again passed the access sheath in an identical fashion as above and was seated with ureteroscopy in an identical fashion as well. I was able to identify stones in each of the calyces and "dusted" each of the stones to fragments that were 1 mm or less. All obvious stones were identified visually and I could not find any further stones that would require fragmentation within the lower pole moiety. I therefore passed a guidewire through the ureteroscope and left this in place, removing the ureteroscope I backloaded the cystoscope and passed the 24 cm stent into the lower pole and remove the guidewire with good curl being noted in the lower pole moiety renal pelvic region and in the bladder. I then backloaded the cystoscope on the guidewire portending the upper pole and passed the 26 cm stent over the guidewire again with good curl noted in the renal pelvis and in the bladder. The bladder was drained and the cystoscope was then removed. The patient tolerated the procedure well no intraoperative complications.  PLAN OF CARE: Discharge to home after PACU  PATIENT DISPOSITION:  PACU - hemodynamically stable.

## 2017-01-04 NOTE — Anesthesia Preprocedure Evaluation (Signed)
Anesthesia Evaluation  Patient identified by MRN, date of birth, ID band Patient awake    Reviewed: Allergy & Precautions, NPO status , Patient's Chart, lab work & pertinent test results  Airway Mallampati: II  TM Distance: >3 FB Neck ROM: Full    Dental no notable dental hx.    Pulmonary sleep apnea , former smoker,    Pulmonary exam normal breath sounds clear to auscultation       Cardiovascular hypertension, Normal cardiovascular exam+ dysrhythmias Atrial Fibrillation + pacemaker  Rhythm:Regular Rate:Normal     Neuro/Psych negative neurological ROS  negative psych ROS   GI/Hepatic negative GI ROS, Neg liver ROS,   Endo/Other  diabetes  Renal/GU Renal InsufficiencyRenal disease  negative genitourinary   Musculoskeletal negative musculoskeletal ROS (+)   Abdominal   Peds negative pediatric ROS (+)  Hematology negative hematology ROS (+)   Anesthesia Other Findings   Reproductive/Obstetrics negative OB ROS                             Anesthesia Physical Anesthesia Plan  ASA: III  Anesthesia Plan: General   Post-op Pain Management:    Induction: Intravenous  Airway Management Planned: LMA  Additional Equipment:   Intra-op Plan:   Post-operative Plan: Extubation in OR  Informed Consent: I have reviewed the patients History and Physical, chart, labs and discussed the procedure including the risks, benefits and alternatives for the proposed anesthesia with the patient or authorized representative who has indicated his/her understanding and acceptance.   Dental advisory given  Plan Discussed with: CRNA and Surgeon  Anesthesia Plan Comments:         Anesthesia Quick Evaluation

## 2017-01-04 NOTE — Anesthesia Procedure Notes (Signed)
Procedure Name: LMA Insertion Date/Time: 01/04/2017 7:14 AM Performed by: Myrtie Soman Pre-anesthesia Checklist: Patient identified, Emergency Drugs available, Suction available, Patient being monitored and Timeout performed Patient Re-evaluated:Patient Re-evaluated prior to inductionOxygen Delivery Method: Circle system utilized Preoxygenation: Pre-oxygenation with 100% oxygen Intubation Type: IV induction Ventilation: Mask ventilation without difficulty LMA: LMA with gastric port inserted LMA Size: 4.0 Number of attempts: 2 Placement Confirmation: positive ETCO2 Tube secured with: Tape (paper) Dental Injury: Teeth and Oropharynx as per pre-operative assessment  Comments: Teeth clenched after induction, able to mask.  Additional diprivan, lidocaine and sevo with effect lma placed.

## 2017-01-04 NOTE — Transfer of Care (Signed)
Immediate Anesthesia Transfer of Care Note  Patient: Steven Duffy  Procedure(s) Performed: Procedure(s): CYSTOSCOPY/RETROGRADE/URETEROSCOPY/HOLMIUM LASER/STONE EXTRACTION WITH BASKET/ DOUBLE J STENT (Right)  Patient Location: PACU  Anesthesia Type:General  Level of Consciousness: awake, oriented, patient cooperative, lethargic and responds to stimulation  Airway & Oxygen Therapy: Patient Spontanous Breathing and Patient connected to face mask oxygen  Post-op Assessment: Report given to RN and Post -op Vital signs reviewed and stable  Post vital signs: Reviewed and stable  Last Vitals:  Vitals:   01/04/17 0545  BP: 125/67  Pulse: 70  Resp: 18  Temp: 36.5 C    Last Pain:  Vitals:   01/04/17 0545  TempSrc: Oral         Complications: No apparent anesthesia complications

## 2017-01-04 NOTE — Progress Notes (Signed)
Patient may go to Short Stay  At 10:00 A.M.-per Dr. Kalman Shan

## 2017-01-18 MED ORDER — MOMETASONE FUROATE 0.1 % EX CREA
0.1 | CUTANEOUS | 3 refills | Status: DC
Start: 2017-01-18 — End: 2017-12-07

## 2017-01-18 NOTE — Telephone Encounter (Signed)
PHARMACY REQUESTING REFILL. PLEASE ADVISE    LAST SEEN 01/04/17

## 2017-02-21 ENCOUNTER — Encounter (HOSPITAL_COMMUNITY): Payer: Self-pay | Admitting: *Deleted

## 2017-02-21 ENCOUNTER — Inpatient Hospital Stay (HOSPITAL_COMMUNITY)
Admission: EM | Admit: 2017-02-21 | Discharge: 2017-02-25 | DRG: 193 | Disposition: A | Discharge status: Skilled Nursing Facility | Payer: Medicare (Managed Care) | Attending: Internal Medicine | Admitting: Internal Medicine

## 2017-02-21 ENCOUNTER — Inpatient Hospital Stay (HOSPITAL_COMMUNITY): Payer: Medicare (Managed Care)

## 2017-02-21 ENCOUNTER — Emergency Department (HOSPITAL_COMMUNITY): Payer: Medicare (Managed Care)

## 2017-02-21 DIAGNOSIS — Z7984 Long term (current) use of oral hypoglycemic drugs: Secondary | ICD-10-CM

## 2017-02-21 DIAGNOSIS — N400 Enlarged prostate without lower urinary tract symptoms: Secondary | ICD-10-CM | POA: Diagnosis present

## 2017-02-21 DIAGNOSIS — D72829 Elevated white blood cell count, unspecified: Secondary | ICD-10-CM | POA: Diagnosis not present

## 2017-02-21 DIAGNOSIS — J181 Lobar pneumonia, unspecified organism: Secondary | ICD-10-CM | POA: Diagnosis not present

## 2017-02-21 DIAGNOSIS — F32A Depression, unspecified: Secondary | ICD-10-CM | POA: Diagnosis present

## 2017-02-21 DIAGNOSIS — D638 Anemia in other chronic diseases classified elsewhere: Secondary | ICD-10-CM | POA: Diagnosis present

## 2017-02-21 DIAGNOSIS — G9341 Metabolic encephalopathy: Secondary | ICD-10-CM | POA: Diagnosis present

## 2017-02-21 DIAGNOSIS — N401 Enlarged prostate with lower urinary tract symptoms: Secondary | ICD-10-CM | POA: Diagnosis present

## 2017-02-21 DIAGNOSIS — J189 Pneumonia, unspecified organism: Secondary | ICD-10-CM | POA: Diagnosis present

## 2017-02-21 DIAGNOSIS — I4510 Unspecified right bundle-branch block: Secondary | ICD-10-CM | POA: Diagnosis present

## 2017-02-21 DIAGNOSIS — Z884 Allergy status to anesthetic agent status: Secondary | ICD-10-CM

## 2017-02-21 DIAGNOSIS — F3289 Other specified depressive episodes: Secondary | ICD-10-CM | POA: Diagnosis not present

## 2017-02-21 DIAGNOSIS — F419 Anxiety disorder, unspecified: Secondary | ICD-10-CM | POA: Diagnosis present

## 2017-02-21 DIAGNOSIS — I480 Paroxysmal atrial fibrillation: Secondary | ICD-10-CM | POA: Diagnosis present

## 2017-02-21 DIAGNOSIS — H409 Unspecified glaucoma: Secondary | ICD-10-CM | POA: Diagnosis present

## 2017-02-21 DIAGNOSIS — E785 Hyperlipidemia, unspecified: Secondary | ICD-10-CM | POA: Diagnosis not present

## 2017-02-21 DIAGNOSIS — E119 Type 2 diabetes mellitus without complications: Secondary | ICD-10-CM | POA: Diagnosis present

## 2017-02-21 DIAGNOSIS — N179 Acute kidney failure, unspecified: Secondary | ICD-10-CM | POA: Diagnosis not present

## 2017-02-21 DIAGNOSIS — Z881 Allergy status to other antibiotic agents status: Secondary | ICD-10-CM

## 2017-02-21 DIAGNOSIS — Z91041 Radiographic dye allergy status: Secondary | ICD-10-CM

## 2017-02-21 DIAGNOSIS — I10 Essential (primary) hypertension: Secondary | ICD-10-CM | POA: Diagnosis not present

## 2017-02-21 DIAGNOSIS — E1142 Type 2 diabetes mellitus with diabetic polyneuropathy: Secondary | ICD-10-CM | POA: Diagnosis present

## 2017-02-21 DIAGNOSIS — F329 Major depressive disorder, single episode, unspecified: Secondary | ICD-10-CM | POA: Diagnosis present

## 2017-02-21 DIAGNOSIS — R509 Fever, unspecified: Secondary | ICD-10-CM

## 2017-02-21 DIAGNOSIS — E1122 Type 2 diabetes mellitus with diabetic chronic kidney disease: Secondary | ICD-10-CM | POA: Diagnosis present

## 2017-02-21 DIAGNOSIS — Z87442 Personal history of urinary calculi: Secondary | ICD-10-CM

## 2017-02-21 DIAGNOSIS — R531 Weakness: Secondary | ICD-10-CM

## 2017-02-21 DIAGNOSIS — E1169 Type 2 diabetes mellitus with other specified complication: Secondary | ICD-10-CM | POA: Diagnosis not present

## 2017-02-21 DIAGNOSIS — H547 Unspecified visual loss: Secondary | ICD-10-CM | POA: Diagnosis present

## 2017-02-21 DIAGNOSIS — K219 Gastro-esophageal reflux disease without esophagitis: Secondary | ICD-10-CM | POA: Diagnosis present

## 2017-02-21 DIAGNOSIS — Z79899 Other long term (current) drug therapy: Secondary | ICD-10-CM

## 2017-02-21 DIAGNOSIS — Z880 Allergy status to penicillin: Secondary | ICD-10-CM

## 2017-02-21 DIAGNOSIS — E876 Hypokalemia: Secondary | ICD-10-CM | POA: Diagnosis present

## 2017-02-21 DIAGNOSIS — G4730 Sleep apnea, unspecified: Secondary | ICD-10-CM | POA: Diagnosis present

## 2017-02-21 DIAGNOSIS — I495 Sick sinus syndrome: Secondary | ICD-10-CM | POA: Diagnosis present

## 2017-02-21 DIAGNOSIS — Z9109 Other allergy status, other than to drugs and biological substances: Secondary | ICD-10-CM

## 2017-02-21 DIAGNOSIS — Z86010 Personal history of colonic polyps: Secondary | ICD-10-CM

## 2017-02-21 DIAGNOSIS — F1021 Alcohol dependence, in remission: Secondary | ICD-10-CM | POA: Diagnosis present

## 2017-02-21 DIAGNOSIS — N183 Chronic kidney disease, stage 3 (moderate): Secondary | ICD-10-CM | POA: Diagnosis not present

## 2017-02-21 DIAGNOSIS — I69354 Hemiplegia and hemiparesis following cerebral infarction affecting left non-dominant side: Secondary | ICD-10-CM | POA: Diagnosis not present

## 2017-02-21 DIAGNOSIS — I129 Hypertensive chronic kidney disease with stage 1 through stage 4 chronic kidney disease, or unspecified chronic kidney disease: Secondary | ICD-10-CM | POA: Diagnosis present

## 2017-02-21 DIAGNOSIS — Z7901 Long term (current) use of anticoagulants: Secondary | ICD-10-CM

## 2017-02-21 DIAGNOSIS — Z888 Allergy status to other drugs, medicaments and biological substances status: Secondary | ICD-10-CM

## 2017-02-21 DIAGNOSIS — Z950 Presence of cardiac pacemaker: Secondary | ICD-10-CM | POA: Diagnosis not present

## 2017-02-21 DIAGNOSIS — M1990 Unspecified osteoarthritis, unspecified site: Secondary | ICD-10-CM | POA: Diagnosis present

## 2017-02-21 DIAGNOSIS — R569 Unspecified convulsions: Secondary | ICD-10-CM | POA: Diagnosis present

## 2017-02-21 DIAGNOSIS — Z91013 Allergy to seafood: Secondary | ICD-10-CM

## 2017-02-21 DIAGNOSIS — Z87891 Personal history of nicotine dependence: Secondary | ICD-10-CM

## 2017-02-21 LAB — COMPREHENSIVE METABOLIC PANEL
ALT: 11 U/L — ABNORMAL LOW (ref 17–63)
ALT: 10 U/L — ABNORMAL LOW (ref 17–63)
ANION GAP: 10 (ref 5–15)
ANION GAP: 10 (ref 5–15)
AST: 14 U/L — AB (ref 15–41)
AST: 17 U/L (ref 15–41)
Albumin: 2.8 g/dL — ABNORMAL LOW (ref 3.5–5.0)
Albumin: 2.8 g/dL — ABNORMAL LOW (ref 3.5–5.0)
Alkaline Phosphatase: 78 U/L (ref 38–126)
Alkaline Phosphatase: 79 U/L (ref 38–126)
BILIRUBIN TOTAL: 0.5 mg/dL (ref 0.3–1.2)
BUN: 25 mg/dL — AB (ref 6–20)
BUN: 25 mg/dL — ABNORMAL HIGH (ref 6–20)
CHLORIDE: 107 mmol/L (ref 101–111)
CO2: 19 mmol/L — ABNORMAL LOW (ref 22–32)
CO2: 16 mmol/L — ABNORMAL LOW (ref 22–32)
Calcium: 8.6 mg/dL — ABNORMAL LOW (ref 8.9–10.3)
Calcium: 8.4 mg/dL — ABNORMAL LOW (ref 8.9–10.3)
Chloride: 112 mmol/L — ABNORMAL HIGH (ref 101–111)
Creatinine, Ser: 2.13 mg/dL — ABNORMAL HIGH (ref 0.61–1.24)
Creatinine, Ser: 1.99 mg/dL — ABNORMAL HIGH (ref 0.61–1.24)
GFR, EST AFRICAN AMERICAN: 33 mL/min — AB (ref >60)
GFR, EST AFRICAN AMERICAN: 36 mL/min — AB (ref >60)
GFR, EST NON AFRICAN AMERICAN: 29 mL/min — AB (ref >60)
GFR, EST NON AFRICAN AMERICAN: 31 mL/min — AB (ref >60)
Glucose, Bld: 112 mg/dL — ABNORMAL HIGH (ref 65–99)
Glucose, Bld: 107 mg/dL — ABNORMAL HIGH (ref 65–99)
POTASSIUM: 3.2 mmol/L — AB (ref 3.5–5.1)
POTASSIUM: 3.5 mmol/L (ref 3.5–5.1)
Sodium: 136 mmol/L (ref 135–145)
Sodium: 138 mmol/L (ref 135–145)
TOTAL PROTEIN: 5.9 g/dL — AB (ref 6.5–8.1)
Total Bilirubin: UNDETERMINED mg/dL (ref 0.3–1.2)
Total Protein: 5.9 g/dL — ABNORMAL LOW (ref 6.5–8.1)

## 2017-02-21 LAB — URINALYSIS, ROUTINE W REFLEX MICROSCOPIC
BACTERIA UA: NONE SEEN
BILIRUBIN URINE: NEGATIVE
GLUCOSE, UA: NEGATIVE mg/dL
KETONES UR: NEGATIVE mg/dL
Leukocytes, UA: NEGATIVE
NITRITE: NEGATIVE
PH: 8 (ref 5.0–8.0)
PROTEIN: NEGATIVE mg/dL
SPECIFIC GRAVITY, URINE: 1.004 — AB (ref 1.005–1.030)
Squamous Epithelial / LPF: NONE SEEN

## 2017-02-21 LAB — CBC WITH DIFFERENTIAL/PLATELET
BASOS PCT: 0 %
Basophils Absolute: 0 10*3/uL (ref 0.0–0.1)
Basophils Absolute: 0 10*3/uL (ref 0.0–0.1)
Basophils Relative: 0 %
EOS ABS: 0 10*3/uL (ref 0.0–0.7)
EOS PCT: 0 %
Eosinophils Absolute: 0 10*3/uL (ref 0.0–0.7)
Eosinophils Relative: 0 %
HCT: 32.4 % — ABNORMAL LOW (ref 39.0–52.0)
HEMATOCRIT: 33.5 % — AB (ref 39.0–52.0)
HEMOGLOBIN: 10.3 g/dL — AB (ref 13.0–17.0)
Hemoglobin: 10.4 g/dL — ABNORMAL LOW (ref 13.0–17.0)
LYMPHS ABS: 1 10*3/uL (ref 0.7–4.0)
LYMPHS PCT: 8 %
Lymphocytes Relative: 9 %
Lymphs Abs: 0.8 10*3/uL (ref 0.7–4.0)
MCH: 30 pg (ref 26.0–34.0)
MCH: 30.4 pg (ref 26.0–34.0)
MCHC: 31 g/dL (ref 30.0–36.0)
MCHC: 31.8 g/dL (ref 30.0–36.0)
MCV: 96.5 fL (ref 78.0–100.0)
MCV: 95.6 fL (ref 78.0–100.0)
MONO ABS: 1.3 10*3/uL — AB (ref 0.1–1.0)
MONOS PCT: 11 %
Monocytes Absolute: 0.9 10*3/uL (ref 0.1–1.0)
Monocytes Relative: 10 %
NEUTROS ABS: 9.1 10*3/uL — AB (ref 1.7–7.7)
NEUTROS PCT: 82 %
Neutro Abs: 7.7 10*3/uL (ref 1.7–7.7)
Neutrophils Relative %: 80 %
PLATELETS: 174 10*3/uL (ref 150–400)
Platelets: 167 10*3/uL (ref 150–400)
RBC: 3.47 MIL/uL — ABNORMAL LOW (ref 4.22–5.81)
RBC: 3.39 MIL/uL — ABNORMAL LOW (ref 4.22–5.81)
RDW: 14.3 % (ref 11.5–15.5)
RDW: 14.2 % (ref 11.5–15.5)
WBC: 11.4 10*3/uL — AB (ref 4.0–10.5)
WBC: 9.4 10*3/uL (ref 4.0–10.5)

## 2017-02-21 LAB — MAGNESIUM: MAGNESIUM: UNDETERMINED mg/dL (ref 1.7–2.4)

## 2017-02-21 LAB — GLUCOSE, CAPILLARY: GLUCOSE-CAPILLARY: 129 mg/dL — AB (ref 65–99)

## 2017-02-21 LAB — INFLUENZA PANEL BY PCR (TYPE A & B)
INFLAPCR: NEGATIVE
INFLBPCR: NEGATIVE

## 2017-02-21 LAB — DIGOXIN LEVEL: Digoxin Level: 0.3 ng/mL — ABNORMAL LOW (ref 0.8–2.0)

## 2017-02-21 LAB — PHOSPHORUS: Phosphorus: 2.8 mg/dL (ref 2.5–4.6)

## 2017-02-21 MED ORDER — METOPROLOL TARTRATE 12.5 MG HALF TABLET
12.5000 mg | ORAL_TABLET | Freq: Two times a day (BID) | ORAL | Status: DC
  Administered 2017-02-21 – 2017-02-25 (×7): 12.5 mg via ORAL
  Filled 2017-02-21 (×8): qty 1

## 2017-02-21 MED ORDER — IPRATROPIUM-ALBUTEROL 0.5-2.5 (3) MG/3ML IN SOLN: 3.0000 mL | RESPIRATORY_TRACT | Status: DC | PRN

## 2017-02-21 MED ORDER — FAMOTIDINE 20 MG PO TABS
20.0000 mg | ORAL_TABLET | Freq: Every day | ORAL | Status: DC
  Administered 2017-02-22 – 2017-02-25 (×4): 20 mg via ORAL
  Filled 2017-02-21 (×4): qty 1

## 2017-02-21 MED ORDER — POTASSIUM CHLORIDE CRYS ER 20 MEQ PO TBCR
40.0000 meq | EXTENDED_RELEASE_TABLET | Freq: Once | ORAL | Status: AC
  Administered 2017-02-21: 40 meq via ORAL
  Filled 2017-02-21: qty 2

## 2017-02-21 MED ORDER — SODIUM CHLORIDE 0.9% FLUSH
3.0000 mL | Freq: Two times a day (BID) | INTRAVENOUS | Status: DC
  Administered 2017-02-22 – 2017-02-25 (×7): 3 mL via INTRAVENOUS

## 2017-02-21 MED ORDER — FERROUS SULFATE 325 (65 FE) MG PO TABS
325.0000 mg | ORAL_TABLET | Freq: Every day | ORAL | Status: DC
  Administered 2017-02-22 – 2017-02-25 (×4): 325 mg via ORAL
  Filled 2017-02-21 (×4): qty 1

## 2017-02-21 MED ORDER — SODIUM CHLORIDE 0.9 % IV SOLN: INTRAVENOUS | Status: DC

## 2017-02-21 MED ORDER — PHENAZOPYRIDINE HCL 100 MG PO TABS: 200.0000 mg | ORAL_TABLET | Freq: Three times a day (TID) | ORAL | Status: DC | PRN

## 2017-02-21 MED ORDER — MAGNESIUM OXIDE 400 (241.3 MG) MG PO TABS
400.0000 mg | ORAL_TABLET | ORAL | Status: DC
  Administered 2017-02-22 – 2017-02-25 (×4): 400 mg via ORAL
  Filled 2017-02-21 (×4): qty 1

## 2017-02-21 MED ORDER — VITAMIN D 1000 UNITS PO TABS
1000.0000 [IU] | ORAL_TABLET | ORAL | Status: DC
  Administered 2017-02-22 – 2017-02-25 (×4): 1000 [IU] via ORAL
  Filled 2017-02-21 (×4): qty 1

## 2017-02-21 MED ORDER — ATORVASTATIN CALCIUM 20 MG PO TABS
20.0000 mg | ORAL_TABLET | Freq: Every day | ORAL | Status: DC
  Administered 2017-02-21 – 2017-02-24 (×4): 20 mg via ORAL
  Filled 2017-02-21 (×4): qty 1

## 2017-02-21 MED ORDER — ALBUTEROL SULFATE (2.5 MG/3ML) 0.083% IN NEBU
5.0000 mg | INHALATION_SOLUTION | Freq: Once | RESPIRATORY_TRACT | Status: AC
  Administered 2017-02-21: 5 mg via RESPIRATORY_TRACT
  Filled 2017-02-21: qty 6

## 2017-02-21 MED ORDER — GABAPENTIN 300 MG PO CAPS
300.0000 mg | ORAL_CAPSULE | Freq: Two times a day (BID) | ORAL | Status: DC
  Administered 2017-02-21 – 2017-02-25 (×8): 300 mg via ORAL
  Filled 2017-02-21 (×8): qty 1

## 2017-02-21 MED ORDER — SODIUM CHLORIDE 0.9 % IV SOLN
1000.0000 mL | Freq: Once | INTRAVENOUS | Status: AC
  Administered 2017-02-21: 1000 mL via INTRAVENOUS

## 2017-02-21 MED ORDER — FINASTERIDE 5 MG PO TABS
5.0000 mg | ORAL_TABLET | ORAL | Status: DC
  Administered 2017-02-22 – 2017-02-25 (×4): 5 mg via ORAL
  Filled 2017-02-21 (×4): qty 1

## 2017-02-21 MED ORDER — ACETAMINOPHEN 500 MG PO TABS
500.0000 mg | ORAL_TABLET | ORAL | Status: DC | PRN
  Administered 2017-02-22 – 2017-02-23 (×2): 1000 mg via ORAL
  Administered 2017-02-25: 500 mg via ORAL
  Administered 2017-02-25: 1000 mg via ORAL
  Filled 2017-02-21: qty 1
  Filled 2017-02-21 (×3): qty 2

## 2017-02-21 MED ORDER — HYDROCODONE-ACETAMINOPHEN 10-325 MG PO TABS: 1.0000 | ORAL_TABLET | ORAL | Status: DC | PRN

## 2017-02-21 MED ORDER — ONDANSETRON HCL 4 MG/2ML IJ SOLN: 4.0000 mg | Freq: Four times a day (QID) | INTRAMUSCULAR | Status: DC | PRN

## 2017-02-21 MED ORDER — LEVOFLOXACIN IN D5W 750 MG/150ML IV SOLN: 750.0000 mg | INTRAVENOUS | Status: DC

## 2017-02-21 MED ORDER — INSULIN ASPART 100 UNIT/ML ~~LOC~~ SOLN
0.0000 [IU] | Freq: Three times a day (TID) | SUBCUTANEOUS | Status: DC
  Administered 2017-02-22 (×2): 1 [IU] via SUBCUTANEOUS
  Administered 2017-02-22: 2 [IU] via SUBCUTANEOUS
  Administered 2017-02-23 – 2017-02-24 (×4): 1 [IU] via SUBCUTANEOUS

## 2017-02-21 MED ORDER — TAMSULOSIN HCL 0.4 MG PO CAPS
0.4000 mg | ORAL_CAPSULE | Freq: Every evening | ORAL | Status: DC
  Administered 2017-02-21 – 2017-02-24 (×4): 0.4 mg via ORAL
  Filled 2017-02-21 (×4): qty 1

## 2017-02-21 MED ORDER — DEXTROSE 5 % IV SOLN
500.0000 mg | INTRAVENOUS | Status: DC
  Administered 2017-02-21 – 2017-02-23 (×3): 500 mg via INTRAVENOUS
  Filled 2017-02-21 (×4): qty 500

## 2017-02-21 MED ORDER — CALCIUM CITRATE 950 (200 CA) MG PO TABS
200.0000 mg | ORAL_TABLET | Freq: Two times a day (BID) | ORAL | Status: DC
  Administered 2017-02-21 – 2017-02-25 (×8): 200 mg via ORAL
  Filled 2017-02-21 (×9): qty 1

## 2017-02-21 MED ORDER — DIGOXIN 125 MCG PO TABS
0.0625 mg | ORAL_TABLET | Freq: Every morning | ORAL | Status: DC
  Administered 2017-02-22 – 2017-02-25 (×4): 0.0625 mg via ORAL
  Filled 2017-02-21 (×4): qty 1

## 2017-02-21 MED ORDER — ONDANSETRON HCL 4 MG PO TABS: 4.0000 mg | ORAL_TABLET | Freq: Four times a day (QID) | ORAL | Status: DC | PRN

## 2017-02-21 MED ORDER — LATANOPROST 0.005 % OP SOLN
1.0000 [drp] | Freq: Every day | OPHTHALMIC | Status: DC
  Administered 2017-02-21 – 2017-02-24 (×4): 1 [drp] via OPHTHALMIC
  Filled 2017-02-21: qty 2.5

## 2017-02-21 MED ORDER — DILTIAZEM HCL ER 180 MG PO CP24
180.0000 mg | ORAL_CAPSULE | ORAL | Status: DC
  Administered 2017-02-22 – 2017-02-25 (×4): 180 mg via ORAL
  Filled 2017-02-21 (×9): qty 1

## 2017-02-21 MED ORDER — DEXTROSE 5 % IV SOLN
1.0000 g | INTRAVENOUS | Status: DC
  Administered 2017-02-21 – 2017-02-24 (×4): 1 g via INTRAVENOUS
  Filled 2017-02-21 (×5): qty 10

## 2017-02-21 MED ORDER — PILOCARPINE HCL 1 % OP SOLN
2.0000 [drp] | Freq: Four times a day (QID) | OPHTHALMIC | Status: DC
  Administered 2017-02-21 – 2017-02-25 (×15): 2 [drp] via OPHTHALMIC
  Filled 2017-02-21: qty 15

## 2017-02-21 MED ORDER — ACETAZOLAMIDE 250 MG PO TABS
250.0000 mg | ORAL_TABLET | Freq: Three times a day (TID) | ORAL | Status: DC
  Administered 2017-02-21 – 2017-02-25 (×13): 250 mg via ORAL
  Filled 2017-02-21 (×18): qty 1

## 2017-02-21 MED ORDER — DOCUSATE SODIUM 100 MG PO CAPS: 100.0000 mg | ORAL_CAPSULE | Freq: Every day | ORAL | Status: DC | PRN

## 2017-02-21 MED ORDER — DABIGATRAN ETEXILATE MESYLATE 150 MG PO CAPS
150.0000 mg | ORAL_CAPSULE | Freq: Two times a day (BID) | ORAL | Status: DC
  Administered 2017-02-21: 150 mg via ORAL
  Filled 2017-02-21: qty 1

## 2017-02-21 MED ORDER — ESCITALOPRAM OXALATE 20 MG PO TABS
20.0000 mg | ORAL_TABLET | Freq: Every morning | ORAL | Status: DC
  Administered 2017-02-22 – 2017-02-25 (×4): 20 mg via ORAL
  Filled 2017-02-21 (×4): qty 1

## 2017-02-21 MED ORDER — SODIUM CHLORIDE 0.9 % IV SOLN
1000.0000 mL | INTRAVENOUS | Status: DC
  Administered 2017-02-21: 1000 mL via INTRAVENOUS

## 2017-02-21 MED ORDER — BUPROPION HCL ER (XL) 150 MG PO TB24
150.0000 mg | ORAL_TABLET | ORAL | Status: DC
  Administered 2017-02-22 – 2017-02-25 (×4): 150 mg via ORAL
  Filled 2017-02-21 (×4): qty 1

## 2017-02-21 NOTE — ED Notes (Signed)
Breathing treatment complete 

## 2017-02-21 NOTE — ED Provider Notes (Signed)
[place] DEPT Provider Note   CSN: [id number] Arrival date & time: 02/21/17  1302     History   Chief Complaint Chief Complaint  Patient presents with  . Weakness    HPI Steven Duffy is a 76 y.o. male.  Pt w PMHx hemorrhagic stroke w L sided paralysis, T2DM, blindness 2/t glaucoma, HTN, a fib, presents w fatigue and dry cough x3wks. Pt reports deep breaths and movement cause him to cough. Assoc w substernal chest pain and fatigue, reports he feels SOB because he cannot take a deep breath without the urge to cough. Reports runny nose w post nasal drip, sore throat, fever, HA. Reports decreased appetite and didn't each much yesterday. Wife reports some increasing confusion. Denies N/V/D, abd pain, recent travel, hx DVT. Pt on Pradaxa. Prior hx smoking, denies EtOH use.      Past Medical History:  Diagnosis Date  . Alcohol dependence in remission (Steven Duffy)   . Allergic rhinitis   . Anemia   . Anxiety    chronic  . Arthritis    osteoarthritis   . Atrial fibrillation (Steven Duffy)   . Blind   . BPH (benign prostatic hypertrophy) with urinary obstruction   . Brachial neuritis or radiculitis   . Carotid bruit   . Colon polyps    hx of   . Coronary artery disease    non obstructive per cath 2005   . Depression   . Dermatitis   . Diabetes mellitus without complication (Steven Duffy)    type II  . GERD (gastroesophageal reflux disease)   . Glaucoma   . Headache    hx of migraines   . Hematuria    hx of   . Hemorrhoids   . History of kidney stones   . Hyperlipidemia   . Hypertension   . Kidney stones    stage 3  . Lumbago   . Lumbar radiculopathy   . Neuropathy (Steven Duffy)   . Neuropathy due to medical condition (Steven Duffy)   . Nontraumatic intracranial hemorrhage (Steven Duffy)   . Occasional tremors    right arm   . Pharyngitis    hx of   . Pneumonia    hx of legionnaires in 2003  . Presence of permanent cardiac pacemaker    [employer]   . Prostatitis   . Rotator cuff tear   . Sinus  bradycardia    required pacer   . Sleep apnea    cpap  . Stroke [facility])    left side weakness   . Ventricular tachycardia (HCC)    hx of   . Vitreous hemorrhage [facility])     Patient Active Problem List   Diagnosis Date Noted  . Acute kidney injury (Steven Duffy) 02/21/2017    Past Surgical History:  Procedure Laterality Date  . cataract surgery      left   . CIRCUMCISION    . COLONOSCOPY    . CYSTOSCOPY     multiple   . CYSTOSCOPY WITH RETROGRADE PYELOGRAM, URETEROSCOPY AND STENT PLACEMENT Right 07/04/2015   Procedure: RIGHT RETROGRADE PYELOGRAM,  RIGHT URETEROSCOPY ;  Surgeon: Kathie Rhodes, MD;  Location: [facility]S;  Service: Urology;  Laterality: Right;  . CYSTOSCOPY/RETROGRADE/URETEROSCOPY/STONE EXTRACTION WITH BASKET Right 01/04/2017   Procedure: CYSTOSCOPY/RETROGRADE/URETEROSCOPY/HOLMIUM LASER/STONE EXTRACTION WITH BASKET/ DOUBLE J STENT;  Surgeon: Kathie Rhodes, MD;  Location: [facility];  Service: Urology;  Laterality: Right;  . hemilaminectomy    . HOLMIUM LASER APPLICATION Right 0/26/3785   Procedure: HOLMIUM LASER LITHOTRIPSY AND STENT PLACEMENT ;  Surgeon: Kathie Rhodes, MD;  Location: [facility];  Service: Urology;  Laterality: Right;  . INTRAOCULAR LENS INSERTION    . neck surgery in 1995 (neck broken)    . placement of gastrostomy tube      hx of   . PP vitrectomy     . rhinologic surgery     . SHOULDER SURGERY    . TRACHEOSTOMY     hx of   . URETHROPLASTY    . VASECTOMY         Home Medications    Prior to Admission medications   Medication Sig Start Date End Date Taking? Authorizing Provider  acetaminophen (TYLENOL) 500 MG tablet Take 500-1,000 mg by mouth every 4 (four) hours as needed for mild pain.     Historical Provider, MD  acetaZOLAMIDE (DIAMOX) 250 MG tablet Take 250 mg by mouth 3 (three) times daily.    Historical Provider, MD  atorvastatin (LIPITOR) 20 MG tablet Take 20 mg by mouth at bedtime.     Historical Provider, MD  bimatoprost (LUMIGAN) 0.01 % SOLN Place 1  drop into the right eye at bedtime. 10/19/14   Historical Provider, MD  buPROPion (WELLBUTRIN XL) 150 MG 24 hr tablet Take 150 mg by mouth every morning.     Historical Provider, MD  calcium citrate (CALCITRATE - DOSED IN MG ELEMENTAL CALCIUM) 950 MG tablet Take 200 mg of elemental calcium by mouth 2 (two) times daily.     Historical Provider, MD  cholecalciferol (VITAMIN D) 1000 units tablet Take 1,000 Units by mouth every morning.     Historical Provider, MD  dabigatran (PRADAXA) 150 MG CAPS capsule Take 150 mg by mouth 2 (two) times daily.    Historical Provider, MD  digoxin (LANOXIN) 0.125 MG tablet Take 0.0625 mg by mouth every morning.     Historical Provider, MD  diltiazem (DILACOR XR) 180 MG 24 hr capsule Take 180 mg by mouth every morning.     Historical Provider, MD  docusate sodium (COLACE) 100 MG capsule Take 100 mg by mouth daily as needed for mild constipation.     Historical Provider, MD  escitalopram (LEXAPRO) 20 MG tablet Take 20 mg by mouth every morning.    Historical Provider, MD  ferrous sulfate 325 (65 FE) MG tablet Take 325 mg by mouth daily with breakfast.    Historical Provider, MD  finasteride (PROSCAR) 5 MG tablet Take 5 mg by mouth every morning.     Historical Provider, MD  gabapentin (NEURONTIN) 300 MG capsule Take 300 mg by mouth 2 (two) times daily.     Historical Provider, MD  HYDROcodone-acetaminophen (NORCO) 10-325 MG tablet Take 1-2 tablets by mouth every 4 (four) hours as needed for moderate pain. Maximum dose per 24 hours - 8 pills 01/04/17   Kathie Rhodes, MD  magnesium oxide (MAG-OX) 400 MG tablet Take 400 mg by mouth every morning.     Historical Provider, MD  metFORMIN (GLUCOPHAGE) 500 MG tablet Take 500 mg by mouth 2 (two) times daily with a meal.    Historical Provider, MD  metoprolol tartrate (LOPRESSOR) 25 MG tablet Take 12.5 mg by mouth 2 (two) times daily.    Historical Provider, MD  phenazopyridine (PYRIDIUM) 200 MG tablet Take 1 tablet (200 mg total) by  mouth 3 (three) times daily as needed for pain. 01/04/17   Kathie Rhodes, MD  pilocarpine (PILOCAR) 1 % ophthalmic solution Place 2 drops into the right eye 4 (four) times daily.  10/19/14  Historical Provider, MD  ranitidine (ZANTAC) 300 MG tablet Take 300 mg by mouth at bedtime.    Historical Provider, MD  tamsulosin (FLOMAX) 0.4 MG CAPS capsule Take 0.4 mg by mouth every evening.     Historical Provider, MD    Family History History reviewed. No pertinent family history.  Social History Social History  Substance Use Topics  . Smoking status: Former Smoker    Quit date: 12/29/1980  . Smokeless tobacco: Never Used  . Alcohol use No     Allergies   Duloxetine; Iodine; Lyrica [pregabalin]; Procaine; Shellfish-derived products; Ciprofloxacin; Penicillins; and Tape   Review of Systems Review of Systems  Constitutional: Positive for fatigue and fever.  HENT: Positive for postnasal drip, rhinorrhea and sore throat. Negative for ear pain.   Respiratory: Positive for cough and shortness of breath.   Cardiovascular: Positive for chest pain.  Gastrointestinal: Negative for abdominal pain, diarrhea, nausea and vomiting.  Musculoskeletal: Positive for myalgias.  Neurological: Positive for headaches.       L-sided flaccid paralysis 2/t hemorrhage stroke.  All other systems reviewed and are negative.    Physical Exam Updated Vital Signs BP (!) 131/94 (BP Location: Right Arm)   Pulse 70   Temp 98.5 F (36.9 C) (Oral)   Resp 20   Ht '5\' 6"'$  (1.676 m)   Wt 67.1 kg   SpO2 98%   BMI 23.89 kg/m   Physical Exam  Constitutional: He is oriented to person, place, and time. He appears well-developed and well-nourished.  HENT:  Head: Normocephalic and atraumatic.  Neck: Neck supple.  Cardiovascular: Normal rate, regular rhythm, normal heart sounds and intact distal pulses.   Pulmonary/Chest:  Pt taking shallow breaths, no wheezes, rales, rhonchi noted on exam.   Abdominal: Soft. Bowel  sounds are normal. He exhibits no distension and no mass. There is no tenderness.  Musculoskeletal:  Flaccid paralysis L side of body.  Neurological: He is alert and oriented to person, place, and time.  Skin: Skin is warm and dry.  Psychiatric: He has a normal mood and affect. His behavior is normal.  Nursing note and vitals reviewed.    ED Treatments / Results  Labs (all labs ordered are listed, but only abnormal results are displayed) Labs Reviewed  CBC WITH DIFFERENTIAL/PLATELET - Abnormal; Notable for the following:       Result Value   WBC 11.4 (*)    RBC 3.47 (*)    Hemoglobin 10.4 (*)    HCT 33.5 (*)    Neutro Abs 9.1 (*)    Monocytes Absolute 1.3 (*)    All other components within normal limits  COMPREHENSIVE METABOLIC PANEL - Abnormal; Notable for the following:    Potassium 3.2 (*)    CO2 19 (*)    Glucose, Bld 112 (*)    BUN 25 (*)    Creatinine, Ser 2.13 (*)    Calcium 8.6 (*)    Total Protein 5.9 (*)    Albumin 2.8 (*)    AST 14 (*)    ALT 11 (*)    GFR calc non Af Amer 29 (*)    GFR calc Af Amer 33 (*)    All other components within normal limits  CULTURE, BLOOD (ROUTINE X 2)  CULTURE, BLOOD (ROUTINE X 2)  INFLUENZA PANEL BY PCR (TYPE A & B)  URINALYSIS, ROUTINE W REFLEX MICROSCOPIC   BUN  Date Value Ref Range Status  02/21/2017 25 (H) 6 - 20 mg/dL Final  01/04/2017 21 (H) 6 - 20 mg/dL Final  12/13/2016 28 (H) 6 - 20 mg/dL Final  06/29/2015 17 6 - 20 mg/dL Final   Creatinine, Ser  Date Value Ref Range Status  02/21/2017 2.13 (H) 0.61 - 1.24 mg/dL Final  01/04/2017 1.25 (H) 0.61 - 1.24 mg/dL Final  12/13/2016 1.48 (H) 0.61 - 1.24 mg/dL Final  06/29/2015 1.06 0.61 - 1.24 mg/dL Final    EKG  EKG Interpretation None       Radiology Dg Chest 2 View  Result Date: 02/21/2017 CLINICAL DATA:  Cough, fatigue, and fever for the past 2-3 days. History of previous CVA with left-sided paralysis, diabetes, coronary artery disease, previous episodes  of pneumonia, former smoker. EXAM: CHEST  2 VIEW COMPARISON:  Portable chest x-ray of February 14, 2011 FINDINGS: The lungs are adequately inflated. There is no focal infiltrate. There is no pleural effusion. The heart and pulmonary vascularity are normal. The mediastinum is normal in width. There is calcification in the wall of the aortic arch. The ICD is in stable position. The bony thorax exhibits no acute abnormality. IMPRESSION: No pneumonia, CHF, nor other acute cardiopulmonary abnormality. Thoracic aortic atherosclerosis. Electronically Signed   By: David  [place] M.D.   On: 02/21/2017 15:21    Procedures Procedures (including critical care time)  Medications Ordered in ED Medications  0.9 %  sodium chloride infusion (not administered)    Followed by  0.9 %  sodium chloride infusion (not administered)  albuterol (PROVENTIL) (2.5 MG/3ML) 0.083% nebulizer solution 5 mg (5 mg Nebulization Given 02/21/17 1418)     Initial Impression / Assessment and Plan / ED Course  I have reviewed the triage vital signs and the nursing notes.  Pertinent labs & imaging results that were available during my care of the patient were reviewed by me and considered in my medical decision making (see chart for details).  Clinical Course as of Feb 22 1604  Thu Feb 21, 2017  1359 Discussed pt w Dr. Venora Maples.   [JR]  1440 Reassessed pt, albuterol neb provided some relief.  [JR]  14 Pt evaluated by Dr. Venora Maples  [JR]    Clinical Course User Index [JR] [place] Nicole Russo, PA-C    Pt presents w fatigue and dry cough x3wks. Increasing weakness and fatigue, confusion. On exam, pt is afebrile, taking shallow breaths, not septic. Flu swab negative. Acute kidney injury, likely 2/t dehydration. CXR wnl, likely early PNA. CT chest for further eval. Anticipate admission with [facility].  Patient discussed with and seen by Dr. Venora Maples.   Final Clinical Impressions(s) / ED Diagnoses   Final diagnoses:  Weakness    Fever, unspecified fever cause  AKI (acute kidney injury) [facility])    New Prescriptions New Prescriptions   No medications on file     [place] Nicole Russo, PA-C 02/21/17 [place], MD 02/22/17 [phone]

## 2017-02-21 NOTE — ED Notes (Signed)
Patient presents to ed via [facility] states patient has been more lethargic over the past several days c/o chest pain. Pain is worse with inspiration and movement. States he has had a dry cough for 3 weeks. No change today only came because of wife.

## 2017-02-21 NOTE — Progress Notes (Addendum)
Steven Duffy is a 76 y.o. male patient admitted from ED awake, alert - oriented  X 4 - no acute distress noted.  VSS - Blood pressure 101/62, pulse 70, temperature 99.9 F (37.7 C), temperature source Rectal, resp. rate 20, height '5\' 6"'$  (1.676 m), weight 67.1 kg (148 lb), SpO2 98 %.    IV in place, occlusive dsg intact without redness.  Orientation to room, and floor completed with information packet given to patient/family.  Admission INP armband ID verified with patient/family, and in place.   SR up x 2, fall assessment complete, with patient and family able to verbalize understanding of risk associated with falls, and verbalized understanding to call nsg before up out of bed.  Call light within reach, patient able to voice, and demonstrate understanding.  Skin, clean-dry- intact without evidence of bruising, or skin tears.   No evidence of skin break down noted on exam. Pt has left sided weakness and blind on both eyes.   Will cont to eval and treat per MD orders.  Dorris Carnes, RN 02/21/2017 5:21 PM

## 2017-02-21 NOTE — H&P (Addendum)
History and Physical    [name] HDQ:[id number] DOB: 31-Jul-1941 DOA: 02/21/2017  Referring MD/NP/PA: PA Durenda Guthrie PCP: No PCP Per Patient   Outpatient Specialists: follows in [facility] health care Dr. Ardeen Fillers   Patient coming from: home   Chief Complaint: weakness, lethargy, cough  HPI: Steven Duffy is a 76 y.o. 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 He presented with ongoing fatigue, weakness and cough intermittently productive of clear sputum for past 3 weeks prior to this admission. Pt also reported shortness of breath and mid chest pain associated with coughing and taking deep breaths. Pt also reported associated fevers at home. No abdominal pain, no nausea or vomiting. No diarrhea or constipation. His wife at the bedside says he has been confused more so than usually over the past week or so. No falls, no lightheadedness or loss of consciousness.   ED Course: Pt was hemodynamically stable on admission with BP 101/62, Tmax 99.9 F, HR 70, good oxygen saturation. Blood work showed WBC count 11.4, hemoglobin 10.4, potassium 3.2 (supplemented), creatinine was 2.13 and glucose 112. CXR showed no pneumonia or CHF. CT chest showed streaky and some tree-in-bud opacities within the right lower lobe likely representing atelectasis and infection, also left upper lobe ground-glass opacity -question infection or nonspecific inflammation. Pt was started on empiric abx, azithro and Rocephin while awaiting culture results.    Review of Systems:    Constitutional: positive for lethargy, fatigue, no diaphoresis, fevers. HENT: Negative for ear pain, nosebleeds, congestion, facial swelling, rhinorrhea, neck pain, neck stiffness and ear discharge.   Eyes: Negative for pain, discharge, redness, itching and visual disturbance.  Respiratory: per HPI Cardiovascular: Negative for chest pain, palpitations and leg swelling.  Gastrointestinal: Negative for abdominal distention.  Genitourinary: Negative for dysuria, urgency, frequency, hematuria, flank pain, decreased urine volume, difficulty urinating and dyspareunia.  Musculoskeletal: Negative for back pain, joint swelling, arthralgias and gait problem.  Neurological: Negative for dizziness, tremors, seizures, syncope, facial asymmetry, speech difficulty, weakness, light-headedness, numbness and headaches.  Hematological: Negative for adenopathy. Does not bruise/bleed easily.  Psychiatric/Behavioral: Negative for hallucinations, behavioral problems, confusion, dysphoric mood, decreased concentration and agitation.   Past Medical History:  Diagnosis Date  . Alcohol dependence in remission ([place])   . Allergic rhinitis   . Anemia   . Anxiety    chronic  . Arthritis    osteoarthritis   . Atrial fibrillation ([place])   . Blind   . BPH (benign prostatic hypertrophy) with urinary obstruction   . Brachial neuritis or radiculitis   . Carotid bruit   . Colon polyps    hx of   . Coronary artery disease    non obstructive per cath 2005   . Depression   . Dermatitis   . Diabetes mellitus without complication ([place])    type II  . GERD (gastroesophageal reflux disease)   . Glaucoma   . Headache    hx of migraines   . Hematuria    hx of   . Hemorrhoids   . History of kidney stones   . Hyperlipidemia   . Hypertension   .  Kidney stones    stage 3  . Lumbago   . Lumbar radiculopathy   . Neuropathy ([place])   . Neuropathy due to medical condition ([place])   . Nontraumatic intracranial hemorrhage  ([place])   . Occasional tremors    right arm   . Pharyngitis    hx of   . Pneumonia    hx of legionnaires in 2003  . Presence of permanent cardiac pacemaker    [employer]   . Prostatitis   . Rotator cuff tear   . Sinus bradycardia    required pacer   . Sleep apnea    cpap  . Stroke [facility])    left side weakness   . Ventricular tachycardia (HCC)    hx of   . Vitreous hemorrhage (HCC)     Past Surgical History:  Procedure Laterality Date  . cataract surgery      left   . CIRCUMCISION    . COLONOSCOPY    . CYSTOSCOPY     multiple   . CYSTOSCOPY WITH RETROGRADE PYELOGRAM, URETEROSCOPY AND STENT PLACEMENT Right 07/04/2015   Procedure: RIGHT RETROGRADE PYELOGRAM,  RIGHT URETEROSCOPY ;  Surgeon: Kathie Rhodes, MD;  Location: [facility];  Service: Urology;  Laterality: Right;  . CYSTOSCOPY/RETROGRADE/URETEROSCOPY/STONE EXTRACTION WITH BASKET Right 01/04/2017   Procedure: CYSTOSCOPY/RETROGRADE/URETEROSCOPY/HOLMIUM LASER/STONE EXTRACTION WITH BASKET/ DOUBLE J STENT;  Surgeon: Kathie Rhodes, MD;  Location: [facility];  Service: Urology;  Laterality: Right;  . hemilaminectomy    . HOLMIUM LASER APPLICATION Right 04/03/9562   Procedure: HOLMIUM LASER LITHOTRIPSY AND STENT PLACEMENT ;  Surgeon: Kathie Rhodes, MD;  Location: [facility];  Service: Urology;  Laterality: Right;  . INTRAOCULAR LENS INSERTION    . neck surgery in 1995 (neck broken)    . placement of gastrostomy tube      hx of   . PP vitrectomy     . rhinologic surgery     . SHOULDER SURGERY    . TRACHEOSTOMY     hx of   . URETHROPLASTY    . VASECTOMY      Social history:  reports that he quit smoking about 36 years ago. He has never used smokeless tobacco. He reports that he does not drink alcohol or use drugs.  Ambulation: ambulates with assistance of a person; he is hemiplegic on left side from strok  Allergies  Allergen Reactions  . Duloxetine Nausea And Vomiting    depression  . Iodine Swelling  . Lyrica [Pregabalin]      Depression, suicidal   . Procaine     unknown  . Shellfish-Derived Products Swelling  . Ciprofloxacin Rash  . Penicillins Rash    Has patient had a PCN reaction causing immediate rash, facial/tongue/throat swelling, SOB or lightheadedness with hypotension: Yes Has patient had a PCN reaction causing severe rash involving mucus membranes or skin necrosis: No Has patient had a PCN reaction that required hospitalization No Has patient had a PCN reaction occurring within the last 10 years: No If all of the above answers are "NO", then may proceed with Cephalosporin use.   . Tape Rash    History reviewed. No pertinent family history.Family history: Hypertension and heart disease in parents.  Prior to Admission medications   Medication Sig Start Date End Date Taking? Authorizing Provider  acetaminophen (TYLENOL) 500 MG tablet Take 500-1,000 mg by mouth every 4 (four) hours as needed for mild pain.     Historical Provider, MD  acetaZOLAMIDE (DIAMOX) 250 MG tablet Take 250  mg by mouth 3 (three) times daily.    Historical Provider, MD  atorvastatin (LIPITOR) 20 MG tablet Take 20 mg by mouth at bedtime.     Historical Provider, MD  bimatoprost (LUMIGAN) 0.01 % SOLN Place 1 drop into the right eye at bedtime. 10/19/14   Historical Provider, MD  buPROPion (WELLBUTRIN XL) 150 MG 24 hr tablet Take 150 mg by mouth every morning.     Historical Provider, MD  calcium citrate (CALCITRATE - DOSED IN MG ELEMENTAL CALCIUM) 950 MG tablet Take 200 mg of elemental calcium by mouth 2 (two) times daily.     Historical Provider, MD  cholecalciferol (VITAMIN D) 1000 units tablet Take 1,000 Units by mouth every morning.     Historical Provider, MD  dabigatran (PRADAXA) 150 MG CAPS capsule Take 150 mg by mouth 2 (two) times daily.    Historical Provider, MD  digoxin (LANOXIN) 0.125 MG tablet Take 0.0625 mg by mouth every morning.     Historical Provider, MD  diltiazem (DILACOR XR) 180 MG 24 hr capsule Take 180 mg by  mouth every morning.     Historical Provider, MD  docusate sodium (COLACE) 100 MG capsule Take 100 mg by mouth daily as needed for mild constipation.     Historical Provider, MD  escitalopram (LEXAPRO) 20 MG tablet Take 20 mg by mouth every morning.    Historical Provider, MD  ferrous sulfate 325 (65 FE) MG tablet Take 325 mg by mouth daily with breakfast.    Historical Provider, MD  finasteride (PROSCAR) 5 MG tablet Take 5 mg by mouth every morning.     Historical Provider, MD  gabapentin (NEURONTIN) 300 MG capsule Take 300 mg by mouth 2 (two) times daily.     Historical Provider, MD  HYDROcodone-acetaminophen (NORCO) 10-325 MG tablet Take 1-2 tablets by mouth every 4 (four) hours as needed for moderate pain. Maximum dose per 24 hours - 8 pills 01/04/17   Kathie Rhodes, MD  magnesium oxide (MAG-OX) 400 MG tablet Take 400 mg by mouth every morning.     Historical Provider, MD  metFORMIN (GLUCOPHAGE) 500 MG tablet Take 500 mg by mouth 2 (two) times daily with a meal.    Historical Provider, MD  metoprolol tartrate (LOPRESSOR) 25 MG tablet Take 12.5 mg by mouth 2 (two) times daily.    Historical Provider, MD  phenazopyridine (PYRIDIUM) 200 MG tablet Take 1 tablet (200 mg total) by mouth 3 (three) times daily as needed for pain. 01/04/17   Kathie Rhodes, MD  pilocarpine (PILOCAR) 1 % ophthalmic solution Place 2 drops into the right eye 4 (four) times daily.  10/19/14   Historical Provider, MD  ranitidine (ZANTAC) 300 MG tablet Take 300 mg by mouth at bedtime.    Historical Provider, MD  tamsulosin (FLOMAX) 0.4 MG CAPS capsule Take 0.4 mg by mouth every evening.     Historical Provider, MD    Physical Exam: Vitals:   02/21/17 1313 02/21/17 1600 02/21/17 1615 02/21/17 1617  BP:  105/66 115/65   Pulse:  68 70   Resp:  15 (!) 22   Temp:    99.9 F (37.7 C)  TempSrc:    Rectal  SpO2:  97% 98%   Weight: 67.1 kg (148 lb)     Height: '5\' 6"'$  (1.676 m)       Constitutional: NAD, calm,  comfortable Vitals:   02/21/17 1313 02/21/17 1600 02/21/17 1615 02/21/17 1617  BP:  105/66 115/65   Pulse:  68 70   Resp:  15 (!) 22   Temp:    99.9 F (37.7 C)  TempSrc:    Rectal  SpO2:  97% 98%   Weight: 67.1 kg (148 lb)     Height: '5\' 6"'$  (1.676 m)      Eyes: PERRL, lids and conjunctivae normal ENMT: Mucous membranes are moist. Posterior pharynx clear of any exudate or lesions.Normal dentition.  Neck: normal, supple, no masses, no thyromegaly Respiratory: clear to auscultation bilaterally, no wheezing, no crackles. Normal respiratory effort. No accessory muscle use.  Cardiovascular: Regular rate and rhythm, no murmurs / rubs / gallops. 2+ pedal pulses. No carotid bruits.  Abdomen: no tenderness, no masses palpated. No hepatosplenomegaly. Bowel sounds positive.  Musculoskeletal: no clubbing / cyanosis. No joint deformity upper and lower extremities. Good ROM, no contractures. Normal muscle tone.  Skin: no rashes, lesions, ulcers. No induration Neurologic: left side hemiplegia, strength good on right side Psychiatric: Normal judgment and insight. Alert and oriented x 3. Normal mood.   Labs on Admission: I have personally reviewed following labs and imaging studies  CBC:  Recent Labs Lab 02/21/17 1428  WBC 11.4*  NEUTROABS 9.1*  HGB 10.4*  HCT 33.5*  MCV 96.5  PLT 580   Basic Metabolic Panel:  Recent Labs Lab 02/21/17 1428  NA 136  K 3.2*  CL 107  CO2 19*  GLUCOSE 112*  BUN 25*  CREATININE 2.13*  CALCIUM 8.6*   GFR: Estimated Creatinine Clearance: 27 mL/min (A) (by C-G formula based on SCr of 2.13 mg/dL (H)). Liver Function Tests:  Recent Labs Lab 02/21/17 1428  AST 14*  ALT 11*  ALKPHOS 78  BILITOT 0.5  PROT 5.9*  ALBUMIN 2.8*   No results for input(s): LIPASE, AMYLASE in the last 168 hours. No results for input(s): AMMONIA in the last 168 hours. Coagulation Profile: No results for input(s): INR, PROTIME in the last 168 hours. Cardiac  Enzymes: No results for input(s): CKTOTAL, CKMB, CKMBINDEX, TROPONINI in the last 168 hours. BNP (last 3 results) No results for input(s): PROBNP in the last 8760 hours. HbA1C: No results for input(s): HGBA1C in the last 72 hours. CBG: No results for input(s): GLUCAP in the last 168 hours. Lipid Profile: No results for input(s): CHOL, HDL, LDLCALC, TRIG, CHOLHDL, LDLDIRECT in the last 72 hours. Thyroid Function Tests: No results for input(s): TSH, T4TOTAL, FREET4, T3FREE, THYROIDAB in the last 72 hours. Anemia Panel: No results for input(s): VITAMINB12, FOLATE, FERRITIN, TIBC, IRON, RETICCTPCT in the last 72 hours. Urine analysis:    Component Value Date/Time   COLORURINE ORANGE BIOCHEMICALS MAY BE AFFECTED BY COLOR (A) 02/24/2011 1719   APPEARANCEUR CLOUDY (A) 02/24/2011 1719   LABSPEC 1.023 02/24/2011 1719   PHURINE 5.0 02/24/2011 1719   GLUCOSEU NEGATIVE 02/24/2011 1719   HGBUR NEGATIVE 02/24/2011 1719   BILIRUBINUR MODERATE (A) 02/24/2011 1719   KETONESUR 15 (A) 02/24/2011 1719   PROTEINUR 30 (A) 02/24/2011 1719   UROBILINOGEN 2.0 (H) 02/24/2011 1719   NITRITE POSITIVE (A) 02/24/2011 1719   LEUKOCYTESUR MODERATE (A) 02/24/2011 1719   Sepsis Labs: '@LABRCNTIP'$ (procalcitonin:4,lacticidven:4) )No results found for this or any previous visit (from the past 240 hour(s)).   Radiological Exams on Admission: Dg Chest 2 View Result Date: 02/21/2017 No pneumonia, CHF, nor other acute cardiopulmonary abnormality. Thoracic aortic atherosclerosis. Electronically Signed   By: David  [place] M.D.   On: 02/21/2017 15:21   Ct Chest Wo Contrast Result Date: 02/21/2017 Streaky and some tree-in-bud opacities within  the right lower lobe likely representing atelectasis and infection. Nonspecific left upper lobe ground-glass opacity -question infection or nonspecific inflammation. Cardiomegaly and coronary artery disease. Mild centrilobular emphysema. Electronically Signed   By: Margarette Canada M.D.    On: 02/21/2017 17:09    EKG: Independently reviewed. Sinus rhythm.  Assessment/Plan  Principal Problem:   Right lower lobe pneumonia (HCC) / Leukocytosis - Pneumonia order set utilized - Reviewed CXR and CT chest independently, findings of opacities in right lower lung lobe concerning for pneumonia  - Started on azithro and rocephin - Follow up blood cx, resp cx - Follow up influenza, legionella, strep pneumonia and HIV results - Use duoneb every 4 hours as needed for shortness of breath and/or wheezing   Active Problems:   Hypokalemia - Likely from lasix - Supplemented    Acute renal failure superimposed on stage 3 chronic kidney disease ([place]) - Baseline creatinine 1.48 in 12/2016 - Cr on this admission 2.13. Elevated likely from metformin and lasix and both meds placed on hold - Repeat creatinine 1.99     Anemia of chronic disease - Likely from CKD - Hemoglobin stable     Dyslipidemia associated with type 2 diabetes mellitus (HCC) - Continue statin therapy    BPH (benign prostatic hyperplasia) - Continue Flomax and Proscar    Benign essential HTN - Continue Cardizem and metoprolol     AF (paroxysmal atrial fibrillation) (HCC) - CHADS vasc score 4 - On AC with pradaxa (has had hemorrhagic stroke with coumadin) - Rate controlled with metoprolol, Cardizem, digoxin     Depression - Stable - Continue wellbutrin    Sinus node dysfunction - Has dual-chamber pacemaker controlling heart rate    Prior stroke with residual left hemiplegia related to hemorrhagic stroke while on Coumadin - Stable on Pradaxa    Seizures as late effect of CVA / Acute encephalopathy  - Has a neurologist in [facility] who he follows with - He is on gabapentin but this is not an ideal antiepileptic for him but will defer to his neurologist for changes in meds on outpt basis - No seizures at this time    DVT prophylaxis: Pradaxa, SCD's  Code Status: full code  Family Communication: wife  at the bedside Disposition Plan: admission to telemetry  Consults called: None  Admission status: inpatient, pt admitted for pneumonia. Needs azithromycin and rocephin. For resp cx and blood cx, legionella, strep pneumo and HIV may take up to 4 days which is the amount of time he will need for this hospitalization and maybe even longer if his creatinine does not improve meanwhile.    Leisa Lenz MD [facility] Pager [phone]  If 7PM-7AM, please contact night-coverage [email or web address] Password [facility]  02/21/2017, 4:23 PM

## 2017-02-21 NOTE — ED Notes (Signed)
Transported to xray 

## 2017-02-21 NOTE — Progress Notes (Signed)
PHARMACY NOTE:  ANTIMICROBIAL RENAL DOSAGE ADJUSTMENT  Current antimicrobial regimen includes a mismatch between antimicrobial dosage and estimated renal function.  As per policy approved by the Pharmacy & Therapeutics and Medical Executive Committees, the antimicrobial dosage will be adjusted accordingly.  Current antimicrobial dosage:  Levaquin 750 mg q24h  Indication: PNA  Renal Function:  Estimated Creatinine Clearance: 27 mL/min (A) (by C-G formula based on SCr of 2.13 mg/dL (H)). '[]'$      On intermittent HD, scheduled: '[]'$      On CRRT    Antimicrobial dosage has been changed to:  Levaquin 500 mg q48h   Thank you for allowing pharmacy to be a part of this patient's care.  Kai Levins, [facility] 02/21/2017 6:33 PM

## 2017-02-21 NOTE — ED Notes (Signed)
Patient transported to CT 

## 2017-02-22 LAB — BASIC METABOLIC PANEL
Anion gap: 10 (ref 5–15)
BUN: 22 mg/dL — ABNORMAL HIGH (ref 6–20)
CO2: 17 mmol/L — ABNORMAL LOW (ref 22–32)
Calcium: 8.4 mg/dL — ABNORMAL LOW (ref 8.9–10.3)
Chloride: 111 mmol/L (ref 101–111)
Creatinine, Ser: 1.79 mg/dL — ABNORMAL HIGH (ref 0.61–1.24)
GFR calc Af Amer: 41 mL/min — ABNORMAL LOW (ref >60)
GFR calc non Af Amer: 35 mL/min — ABNORMAL LOW (ref >60)
Glucose, Bld: 121 mg/dL — ABNORMAL HIGH (ref 65–99)
Potassium: 3.6 mmol/L (ref 3.5–5.1)
Sodium: 138 mmol/L (ref 135–145)

## 2017-02-22 LAB — CBC
HCT: 32 % — ABNORMAL LOW (ref 39.0–52.0)
Hemoglobin: 10.1 g/dL — ABNORMAL LOW (ref 13.0–17.0)
MCH: 30.7 pg (ref 26.0–34.0)
MCHC: 31.6 g/dL (ref 30.0–36.0)
MCV: 97.3 fL (ref 78.0–100.0)
Platelets: 157 10*3/uL (ref 150–400)
RBC: 3.29 MIL/uL — ABNORMAL LOW (ref 4.22–5.81)
RDW: 14.5 % (ref 11.5–15.5)
WBC: 9.3 10*3/uL (ref 4.0–10.5)

## 2017-02-22 LAB — GLUCOSE, CAPILLARY
GLUCOSE-CAPILLARY: 122 mg/dL — AB (ref 65–99)
GLUCOSE-CAPILLARY: 132 mg/dL — AB (ref 65–99)
GLUCOSE-CAPILLARY: 174 mg/dL — AB (ref 65–99)
Glucose-Capillary: 157 mg/dL — ABNORMAL HIGH (ref 65–99)

## 2017-02-22 LAB — HIV ANTIBODY (ROUTINE TESTING W REFLEX): HIV SCREEN 4TH GENERATION: NONREACTIVE

## 2017-02-22 LAB — HEMOGLOBIN A1C
HEMOGLOBIN A1C: 5.6 % (ref 4.8–5.6)
Mean Plasma Glucose: 114 mg/dL

## 2017-02-22 MED ORDER — DABIGATRAN ETEXILATE MESYLATE 75 MG PO CAPS
75.0000 mg | ORAL_CAPSULE | Freq: Two times a day (BID) | ORAL | Status: DC
  Administered 2017-02-22: 75 mg via ORAL
  Filled 2017-02-22 (×2): qty 1

## 2017-02-22 MED ORDER — DABIGATRAN ETEXILATE MESYLATE 150 MG PO CAPS
150.0000 mg | ORAL_CAPSULE | Freq: Two times a day (BID) | ORAL | Status: DC
  Administered 2017-02-22 – 2017-02-25 (×6): 150 mg via ORAL
  Filled 2017-02-22 (×6): qty 1

## 2017-02-22 NOTE — Progress Notes (Addendum)
Patient wants to go to [facility] through [facility]. Patient's pasrr is under level II review; Financial planner will come assess pt. Pt cannot dc to SNF without pasrr.  Percell Locus Perian Tedder LCSWA [phone]

## 2017-02-22 NOTE — NC FL2 (Signed)
[place] LEVEL OF CARE SCREENING TOOL     IDENTIFICATION  Patient Name: Steven Duffy Birthdate: Feb 28, 1941 Sex: male Admission Date (Current Location): 02/21/2017  [facility] and [place] Number:  Herbalist and Address:  [facility] [place]. [facility], [place] [address] [place], [place] [address]      Provider Number: [id number]  Attending Physician Name and Address:  Robbie Lis, MD  Relative Name and Phone Number:  Hoyle Sauer, spouse, [phone]    Current Level of Care: Hospital Recommended Level of Care: [place] Prior Approval Number:    Date Approved/Denied:   PASRR Number:    Discharge Plan: SNF    Current Diagnoses: Patient Active Problem List   Diagnosis Date Noted  . Right lower lobe pneumonia ([place]) 02/21/2017  . Hypokalemia 02/21/2017  . Acute renal failure superimposed on stage 3 chronic kidney disease ([place]) 02/21/2017  . Leukocytosis 02/21/2017  . Anemia of chronic disease 02/21/2017  . Dyslipidemia associated with type 2 diabetes mellitus ([place]) 02/21/2017  . BPH (benign prostatic hyperplasia) 02/21/2017  . Depression 02/21/2017  . Benign essential HTN 02/21/2017  . AF (paroxysmal atrial fibrillation) ([place]) 02/21/2017    Orientation RESPIRATION BLADDER Height & Weight     Self, Time, Situation, Place  Normal Continent Weight: 67.7 kg (149 lb 4 oz) Height:  '5\' 6"'$  (167.6 cm)  BEHAVIORAL SYMPTOMS/MOOD NEUROLOGICAL BOWEL NUTRITION STATUS      Continent Diet (Please see DC Summary)  AMBULATORY STATUS COMMUNICATION OF NEEDS Skin   Extensive Assist Verbally Normal                       Personal Care Assistance Level of Assistance  Bathing, Feeding, Dressing Bathing Assistance: Maximum assistance Feeding assistance: Independent Dressing Assistance: Maximum assistance     Functional Limitations Info             SPECIAL CARE FACTORS FREQUENCY  PT (By licensed PT)     PT Frequency: 5x/week               Contractures      Additional Factors Info  Psychotropic Code Status Info: Full Allergies Info:  Duloxetine, Iodine, Lyrica Pregabalin, Shellfish-derived Products, Procaine, Ciprofloxacin, Penicillins, Tape Psychotropic Info: Wellbutrin   Isolation Precautions Info: Droplet precautions     Current Medications (02/22/2017):  This is the current hospital active medication list Current Facility-Administered Medications  Medication Dose Route Frequency Provider Last Rate Last Dose  . 0.9 %  sodium chloride infusion   Intravenous Continuous Robbie Lis, MD      . acetaminophen (TYLENOL) tablet 500-1,000 mg  500-1,000 mg Oral Q4H PRN Robbie Lis, MD   1,000 mg at 02/22/17 1106  . acetaZOLAMIDE (DIAMOX) tablet 250 mg  250 mg Oral TID Robbie Lis, MD   250 mg at 02/22/17 0933  . atorvastatin (LIPITOR) tablet 20 mg  20 mg Oral QHS Robbie Lis, MD   20 mg at 02/21/17 2127  . azithromycin (ZITHROMAX) 500 mg in dextrose 5 % 250 mL IVPB  500 mg Intravenous Q24H Robbie Lis, MD   500 mg at 02/21/17 2158  . buPROPion (WELLBUTRIN XL) 24 hr tablet 150 mg  150 mg Oral BH-q7a Alma M Devine, MD   150 mg at 02/22/17 9323  . calcium citrate (CALCITRATE - dosed in mg elemental calcium) tablet 200 mg of elemental calcium  200 mg of elemental calcium Oral BID Robbie Lis,  MD   200 mg of elemental calcium at 02/22/17 0932  . cefTRIAXone (ROCEPHIN) 1 g in dextrose 5 % 50 mL IVPB  1 g Intravenous Q24H Robbie Lis, MD   1 g at 02/21/17 2125  . cholecalciferol (VITAMIN D) tablet 1,000 Units  1,000 Units Oral IR-S8N Robbie Lis, MD   1,000 Units at 02/22/17 [phone]  . dabigatran (PRADAXA) capsule 150 mg  150 mg Oral BID Robbie Lis, MD      . digoxin [facility]) tablet 0.0625 mg  0.0625 mg Oral q morning - 10a Robbie Lis, MD   0.0625 mg at 02/22/17 0931  . diltiazem (DILACOR XR) 24 hr capsule 180 mg  180 mg Oral BH-q7a Alma M Devine, MD   180 mg at 02/22/17 0350  . docusate sodium (COLACE)  capsule 100 mg  100 mg Oral Daily PRN Robbie Lis, MD      . escitalopram (LEXAPRO) tablet 20 mg  20 mg Oral q morning - 10a Robbie Lis, MD   20 mg at 02/22/17 0933  . famotidine (PEPCID) tablet 20 mg  20 mg Oral Daily Robbie Lis, MD   20 mg at 02/22/17 0930  . ferrous sulfate tablet 325 mg  325 mg Oral Q breakfast Robbie Lis, MD   325 mg at 02/22/17 0947  . finasteride (PROSCAR) tablet 5 mg  5 mg Oral BH-q7a Alma M Devine, MD   5 mg at 02/22/17 0938  . gabapentin (NEURONTIN) capsule 300 mg  300 mg Oral BID Robbie Lis, MD   300 mg at 02/22/17 0932  . HYDROcodone-acetaminophen (NORCO) 10-325 MG per tablet 1-2 tablet  1-2 tablet Oral Q4H PRN Robbie Lis, MD      . insulin aspart (novoLOG) injection 0-9 Units  0-9 Units Subcutaneous TID WC Robbie Lis, MD   2 Units at 02/22/17 1442  . ipratropium-albuterol (DUONEB) 0.5-2.5 (3) MG/3ML nebulizer solution 3 mL  3 mL Nebulization Q4H PRN Robbie Lis, MD      . latanoprost (XALATAN) 0.005 % ophthalmic solution 1 drop  1 drop Right Eye QHS Robbie Lis, MD   1 drop at 02/21/17 2127  . magnesium oxide (MAG-OX) tablet 400 mg  400 mg Oral BH-q7a Alma M Devine, MD   400 mg at 02/22/17 1829  . metoprolol tartrate (LOPRESSOR) tablet 12.5 mg  12.5 mg Oral BID Robbie Lis, MD   12.5 mg at 02/22/17 0932  . ondansetron (ZOFRAN) tablet 4 mg  4 mg Oral Q6H PRN Robbie Lis, MD       Or  . ondansetron [facility]) injection 4 mg  4 mg Intravenous Q6H PRN Robbie Lis, MD      . phenazopyridine (PYRIDIUM) tablet 200 mg  200 mg Oral TID PRN Robbie Lis, MD      . pilocarpine (PILOCAR) 1 % ophthalmic solution 2 drop  2 drop Right Eye QID Robbie Lis, MD   2 drop at 02/22/17 1443  . sodium chloride flush (NS) 0.9 % injection 3 mL  3 mL Intravenous Q12H Robbie Lis, MD   3 mL at 02/22/17 0949  . tamsulosin (FLOMAX) capsule 0.4 mg  0.4 mg Oral QPM Robbie Lis, MD   0.4 mg at 02/21/17 1841     Discharge Medications: Please see discharge  summary for a list of discharge medications.  Relevant Imaging Results:  Relevant Lab Results:   Additional Information SSn:  [place] Kayne Yuhas, LCSWA

## 2017-02-22 NOTE — Progress Notes (Addendum)
Patient ID: Steven Duffy, male   DOB: 01-03-1941, 76 y.o.   MRN: [id number]  PROGRESS NOTE    Steven Duffy  FVC:[id number] DOB: 09-09-1941 DOA: 02/21/2017  PCP: No PCP Per Patient   Brief Narrative:  76 y.o. 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 He presented with ongoing fatigue, weakness and cough intermittently productive of clear sputum for past 3 weeks prior to this admission. Pt also reported shortness of breath and mid chest pain associated with coughing and fevers.  In ED, pt was hemodynamically stable on admission with BP 101/62, Tmax 99.9 F, HR 70, good oxygen saturation. Blood work showed WBC count 11.4, hemoglobin 10.4, potassium 3.2 (supplemented), creatinine was 2.13 and glucose 112. CXR showed no pneumonia or CHF. CT chest showed streaky and some tree-in-bud opacities within the right lower lobe likely representing atelectasis and infection, also left upper lobe ground-glass opacity -question infection or nonspecific inflammation. Pt was started on empiric abx, azithro and Rocephin while awaiting culture results.    Assessment & Plan:   Principal Problem:   Right lower lobe pneumonia (HCC) / Leukocytosis - CXR and CT scan done on admission  - Reviewed CXR and CT chest independently, findings of opacities in right lower lung lobe concerning for pneumonia  - Continue azithro and rocephin - Legionella, strep pneumonia and HIV results are pending as of this am - Blood cx pending  - Continue duoneb every 4 hours as needed for shortness  of breath and/or wheezing   Active Problems:   Hypokalemia - Likely from lasix - Supplemented and WNL    Acute renal failure superimposed on stage 3 chronic kidney disease ([place]) - Baseline creatinine 1.48 in 12/2016 - Cr on this admission 2.13. Elevated likely from metformin and lasix and both meds placed on hold - Cr improving     Anemia of chronic disease - Likely from CKD - Hemoglobin stable overall     Dyslipidemia associated with type 2 diabetes mellitus (HCC) - Continue statin therapy    BPH (benign prostatic hyperplasia) - Continue Flomax and Proscar    Benign essential HTN - Continue Cardizem and metoprolol     AF (paroxysmal atrial fibrillation) (HCC) - CHADS vasc score 4 - Continue AC with pradaxa (has had hemorrhagic stroke with coumadin) - Rate controlled with metoprolol, Cardizem, digoxin     Depression - Stable - Continue wellbutrin    Sinus node dysfunction - Has dual-chamber pacemaker controlling heart rate    Prior stroke with residual left hemiplegia related to hemorrhagic stroke while on Coumadin - Stable on Pradaxa, dose reduced per pharmacy dosing     Seizures as late effect of CVA / Acute encephalopathy  - Has a neurologist in [facility] health who he follows with - He is on gabapentin but this is not an ideal antiepileptic for him but will defer to his neurologist for changes in meds on outpt basis - No seizures at this time     DVT prophylaxis: Pradaxa  Code Status: full code  Family Communication: family sitter at bedside  Disposition Plan: home likely by Monday 3/19   Consultants:  None  Procedures:   None   Antimicrobials:   Azithromycin and rocephin 02/21/17 -->    Subjective: No overnight events.   Objective: Vitals:   02/21/17 2131 02/21/17 2359 02/22/17 0500 02/22/17 0622  BP: 114/60 134/63  127/61  Pulse: 70 70  72  Resp:  18  18  Temp:  99.9 F (37.7 C)  98.7 F (37.1 C)  TempSrc:    Oral  SpO2: 98% 97%   97%  Weight:   67.7 kg (149 lb 4 oz)   Height:        Intake/Output Summary (Last 24 hours) at 02/22/17 1240 Last data filed at 02/22/17 0800  Gross per 24 hour  Intake              240 ml  Output              300 ml  Net              -60 ml   Filed Weights   02/21/17 1313 02/22/17 0500  Weight: 67.1 kg (148 lb) 67.7 kg (149 lb 4 oz)    Examination:  General exam: Appears calm and comfortable  Respiratory system: diminished breath sounds, wheezing in upper lung lobes  Cardiovascular system: S1 & S2 heard, Rate controlled  Gastrointestinal system: (+) BS, non tender  Central nervous system: left side paralysis  Extremities: Symmetric 5 x 5 power. Skin: No rashes, lesions or ulcers Psychiatry: Judgement and insight appear normal. Mood & affect appropriate.   Data Reviewed: I have personally reviewed following labs and imaging studies  CBC:  Recent Labs Lab 02/21/17 1428 02/21/17 1811 02/22/17 0717  WBC 11.4* 9.4 9.3  NEUTROABS 9.1* 7.7  --   HGB 10.4* 10.3* 10.1*  HCT 33.5* 32.4* 32.0*  MCV 96.5 95.6 97.3  PLT 174 167 161   Basic Metabolic Panel:  Recent Labs Lab 02/21/17 1428 02/21/17 1811 02/22/17 0717  NA 136 138 138  K 3.2* 3.5 3.6  CL 107 112* 111  CO2 19* 16* 17*  GLUCOSE 112* 107* 121*  BUN 25* 25* 22*  CREATININE 2.13* 1.99* 1.79*  CALCIUM 8.6* 8.4* 8.4*  MG  --  QUANTITY NOT SUFFICIENT, UNABLE TO PERFORM TEST  --   PHOS  --  2.8  --    GFR: Estimated Creatinine Clearance: 32.2 mL/min (A) (by C-G formula based on SCr of 1.79 mg/dL (H)). Liver Function Tests:  Recent Labs Lab 02/21/17 1428 02/21/17 1811  AST 14* 17  ALT 11* 10*  ALKPHOS 78 79  BILITOT 0.5 QUANTITY NOT SUFFICIENT, UNABLE TO PERFORM TEST  PROT 5.9* 5.9*  ALBUMIN 2.8* 2.8*   No results for input(s): LIPASE, AMYLASE in the last 168 hours. No results for input(s): AMMONIA in the last 168 hours. Coagulation Profile: No results for input(s): INR, PROTIME in the last 168  hours. Cardiac Enzymes: No results for input(s): CKTOTAL, CKMB, CKMBINDEX, TROPONINI in the last 168 hours. BNP (last 3 results) No results for input(s): PROBNP in the last 8760 hours. HbA1C:  Recent Labs  02/21/17 1811  HGBA1C 5.6   CBG:  Recent Labs Lab 02/21/17 2357 02/22/17 0801  GLUCAP 129* 122*   Lipid Profile: No results for input(s): CHOL, HDL, LDLCALC, TRIG, CHOLHDL, LDLDIRECT in the last 72 hours. Thyroid Function Tests: No results for input(s): TSH, T4TOTAL, FREET4, T3FREE, THYROIDAB in the last 72 hours. Anemia Panel: No results for input(s): VITAMINB12, FOLATE, FERRITIN, TIBC, IRON, RETICCTPCT in the last 72 hours.  Urine analysis:    Component Value Date/Time   COLORURINE STRAW (A) 02/21/2017 2230   APPEARANCEUR CLEAR 02/21/2017 2230   LABSPEC 1.004 (L) 02/21/2017 2230   PHURINE 8.0 02/21/2017 2230   GLUCOSEU NEGATIVE 02/21/2017 2230   HGBUR SMALL (A) 02/21/2017 2230   BILIRUBINUR NEGATIVE 02/21/2017 2230   KETONESUR NEGATIVE 02/21/2017 2230   PROTEINUR NEGATIVE 02/21/2017 2230   UROBILINOGEN 2.0 (H) 02/24/2011 1719   NITRITE NEGATIVE 02/21/2017 2230   LEUKOCYTESUR NEGATIVE 02/21/2017 2230   Sepsis Labs: '@LABRCNTIP'$ (procalcitonin:4,lacticidven:4)   )No results found for this or any previous visit (from the past 240 hour(s)).    Radiology Studies: Dg Chest 2 View Result Date: 02/21/2017 No pneumonia, CHF, nor other acute cardiopulmonary abnormality. Thoracic aortic atherosclerosis. Electronically Signed   By: David  Martinique M.D.   On: 02/21/2017 15:21   Ct Chest Wo Contrast  Result Date: 02/21/2017 Streaky and some tree-in-bud opacities within the right lower lobe likely representing atelectasis and infection. Nonspecific left upper lobe ground-glass opacity -question infection or nonspecific inflammation. Cardiomegaly and coronary artery disease. Mild centrilobular emphysema. Electronically Signed   By: Margarette Canada M.D.   On: 02/21/2017 17:09      Scheduled Meds: . acetaZOLAMIDE  250 mg Oral TID  . atorvastatin  20 mg Oral QHS  . azithromycin  500 mg Intravenous Q24H  . buPROPion  150 mg Oral BH-q7a  . calcium citrate  200 mg of elemental calcium Oral BID  . cefTRIAXone (ROCEPHIN)  IV  1 g Intravenous Q24H  . cholecalciferol  1,000 Units Oral BH-q7a  . dabigatran  75 mg Oral BID  . digoxin  0.0625 mg Oral q morning - 10a  . diltiazem  180 mg Oral BH-q7a  . escitalopram  20 mg Oral q morning - 10a  . famotidine  20 mg Oral Daily  . ferrous sulfate  325 mg Oral Q breakfast  . finasteride  5 mg Oral BH-q7a  . gabapentin  300 mg Oral BID  . insulin aspart  0-9 Units Subcutaneous TID WC  . latanoprost  1 drop Right Eye QHS  . magnesium oxide  400 mg Oral BH-q7a  . metoprolol tartrate  12.5 mg Oral BID  . pilocarpine  2 drop Right Eye QID  . sodium chloride flush  3 mL Intravenous Q12H  . tamsulosin  0.4 mg Oral QPM   Continuous Infusions: . sodium chloride       LOS: 1 day    Time spent: 25 minutes  Greater than 50% of the time spent on counseling and coordinating the care.   Leisa Lenz, MD [facility] Pager [phone]  If 7PM-7AM, please contact night-coverage [email or web address] Password [email or web address] 02/22/2017, 12:40 PM

## 2017-02-22 NOTE — Evaluation (Signed)
Physical Therapy Evaluation Patient Details Name: Steven Duffy MRN: [id number] DOB: 1941/08/21 Today's Date: 02/22/2017   History of Present Illness  Pt is a 76 y.o. 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 Pt admitted for pneumonia.  Clinical Impression  Pt admitted with above diagnosis. Pt currently with functional limitations due to the deficits listed below (see PT Problem List). On eval, pt required max assist bed mobility and max/total assist transfer bed to recliner. RN/NT educated on transfer procedure and level of assist needed. Recommended to RN use of hoyer lift for back to bed if needed. Pt will benefit from skilled PT to increase their independence and safety with mobility to allow discharge to the venue listed below.  Pt attends [facility] 5 days/week. Recommend PT in that setting for improved ability with transfers.     Follow Up Recommendations Supervision/Assistance - 24 hour;Home health PT (PT at [facility])    Equipment Recommendations  None recommended by PT    Recommendations for Other Services       Precautions / Restrictions Precautions Precautions: Fall      Mobility  Bed Mobility Overal bed mobility: Needs Assistance Bed Mobility: Supine to Sit     Supine to sit: Max assist;HOB elevated     General bed mobility comments: use of bed pad to scoot to EOB  Transfers Overall transfer level: Needs assistance Equipment used: None Transfers: Lateral/Scoot Transfers          Lateral/Scoot Transfers: Max assist General transfer comment: lateral scoot toward right into drop-arm recliner using bed pad. +2 total assist using bed pad to scoot back in recliner.  Ambulation/Gait             General Gait Details: nonambulatory  Stairs             Wheelchair Mobility    Modified Rankin (Stroke Patients Only)       Balance Overall balance assessment: Needs assistance   Sitting balance-Leahy Scale: Poor Sitting balance - Comments: min/mod assist to maintain sitting balance EOB Postural control: Posterior lean;Left lateral lean                                   Pertinent Vitals/Pain Pain Assessment: No/denies pain    Home Living Family/patient expects to be discharged to:: Private residence Living Arrangements: Spouse/significant other Available Help at Discharge: Family;Personal care attendant;Other (Comment) ([facility] 2 hours in AM and 2 hours in PM. [facility] 5 days/week) Type of Home: House Home Access: Ramped entrance     Home Layout: One level Home Equipment: Wheelchair - manual;Wheelchair - power;Walker - 2 wheels;Grab bars - tub/shower;Grab bars - toilet;Other (comment) (lift chair)      Prior Function Level of Independence: Needs assistance   Gait / Transfers Assistance Needed: nonambulatory, transfers only with assist from PCA  ADL's / Homemaking Assistance Needed: PCA assist with all ADLs        Hand Dominance   Dominant Hand: Right    Extremity/Trunk Assessment   Upper Extremity Assessment Upper Extremity Assessment: LUE deficits/detail LUE Deficits / Details: spastic hemiplegia    Lower Extremity Assessment Lower Extremity Assessment: LLE deficits/detail LLE Deficits / Details: spastic hemiplegia    Cervical / Trunk Assessment Cervical / Trunk Assessment: Kyphotic  Communication  Communication: No difficulties  Cognition Arousal/Alertness: Awake/alert Behavior During Therapy: WFL for tasks assessed/performed Overall Cognitive Status: Within Functional Limits for tasks assessed                      General Comments      Exercises     Assessment/Plan    PT Assessment Patient needs continued PT services  PT Problem List Decreased strength;Decreased  balance;Decreased activity tolerance;Decreased mobility;Decreased safety awareness       PT Treatment Interventions Functional mobility training;Neuromuscular re-education;Balance training;Therapeutic exercise;Patient/family education;Therapeutic activities    PT Goals (Current goals can be found in the Care Plan section)  Acute Rehab PT Goals Patient Stated Goal: home PT Goal Formulation: With patient/family Time For Goal Achievement: 03/08/17 Potential to Achieve Goals: Fair    Frequency Min 3X/week   Barriers to discharge        Co-evaluation               End of Session Equipment Utilized During Treatment: Gait belt Activity Tolerance: Patient tolerated treatment well Patient left: in chair;with chair alarm set;with call bell/phone within reach;with nursing/sitter in room;with family/visitor present Nurse Communication: Mobility status;Need for lift equipment PT Visit Diagnosis: Muscle weakness (generalized) (M62.81);Other abnormalities of gait and mobility (R26.89)         Time: 9702-6378 PT Time Calculation (min) (ACUTE ONLY): 39 min   Charges:   PT Evaluation $PT Eval Moderate Complexity: 1 Procedure PT Treatments $Therapeutic Activity: 23-37 mins   PT G Codes:         Lorriane Shire 02/22/2017, 10:18 AM

## 2017-02-23 LAB — CBC
HEMATOCRIT: 33.4 % — AB (ref 39.0–52.0)
Hemoglobin: 10.4 g/dL — ABNORMAL LOW (ref 13.0–17.0)
MCH: 30.1 pg (ref 26.0–34.0)
MCHC: 31.1 g/dL (ref 30.0–36.0)
MCV: 96.8 fL (ref 78.0–100.0)
PLATELETS: 177 10*3/uL (ref 150–400)
RBC: 3.45 MIL/uL — ABNORMAL LOW (ref 4.22–5.81)
RDW: 14.6 % (ref 11.5–15.5)
WBC: 8.9 10*3/uL (ref 4.0–10.5)

## 2017-02-23 LAB — GLUCOSE, CAPILLARY
GLUCOSE-CAPILLARY: 127 mg/dL — AB (ref 65–99)
Glucose-Capillary: 127 mg/dL — ABNORMAL HIGH (ref 65–99)
Glucose-Capillary: 123 mg/dL — ABNORMAL HIGH (ref 65–99)
Glucose-Capillary: 133 mg/dL — ABNORMAL HIGH (ref 65–99)

## 2017-02-23 LAB — BASIC METABOLIC PANEL
ANION GAP: 10 (ref 5–15)
BUN: 23 mg/dL — ABNORMAL HIGH (ref 6–20)
CALCIUM: 8.2 mg/dL — AB (ref 8.9–10.3)
CO2: 15 mmol/L — AB (ref 22–32)
CREATININE: 1.68 mg/dL — AB (ref 0.61–1.24)
Chloride: 113 mmol/L — ABNORMAL HIGH (ref 101–111)
GFR, EST AFRICAN AMERICAN: 44 mL/min — AB (ref >60)
GFR, EST NON AFRICAN AMERICAN: 38 mL/min — AB (ref >60)
Glucose, Bld: 127 mg/dL — ABNORMAL HIGH (ref 65–99)
Potassium: 3.5 mmol/L (ref 3.5–5.1)
SODIUM: 138 mmol/L (ref 135–145)

## 2017-02-23 LAB — STREP PNEUMONIAE URINARY ANTIGEN: STREP PNEUMO URINARY ANTIGEN: NEGATIVE

## 2017-02-23 NOTE — Discharge Instructions (Signed)

## 2017-02-23 NOTE — Progress Notes (Signed)
Droplet precautions discontinued per Dr. Charlies Silvers. Negative Influenza A & B.

## 2017-02-23 NOTE — Progress Notes (Addendum)
Patient ID: Steven Duffy, male   DOB: [birth year], 76 y.o.   MRN: [id number]  PROGRESS NOTE    Steven Duffy  GUY:[id number] DOB: 09-16-1941 DOA: 02/21/2017  PCP: No PCP Per Patient   Brief Narrative:  76 y.o. 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 He presented with ongoing fatigue, weakness and cough intermittently productive of clear sputum for past 3 weeks prior to this admission. Pt also reported shortness of breath and mid chest pain associated with coughing and fevers.  In ED, pt was hemodynamically stable on admission with BP 101/62, Tmax 99.9 F, HR 70, good oxygen saturation. Blood work showed WBC count 11.4, hemoglobin 10.4, potassium 3.2 (supplemented), creatinine was 2.13 and glucose 112. CXR showed no pneumonia or CHF. CT chest showed streaky and some tree-in-bud opacities within the right lower lobe likely representing atelectasis and infection, also left upper lobe ground-glass opacity -question infection or nonspecific inflammation. Pt was started on empiric abx, azithro and Rocephin while awaiting culture results.    Assessment & Plan:   Principal Problem:   Right lower lobe pneumonia (HCC) / Leukocytosis - CXR and CT scan done on admission  - CXR and CT chest reviewed  independently, findings of opacities in right lower lung lobe concerning for pneumonia  - Continue azithro and rocephin for now  - Strep pneumonia pending - HIV non reactive - Blood cx showed no growth so far   Active Problems:   Hypokalemia - Likely from lasix -  Supplemented     Acute renal failure superimposed on stage 3 chronic kidney disease ([place]) - Baseline creatinine 1.48 in 12/2016 - Cr on this admission 2.13. Elevated likely from metformin and lasix and both meds placed on hold - Follow up BMP in am    Anemia of chronic disease - Likely from CKD - Hemoglobin stable     Dyslipidemia associated with type 2 diabetes mellitus (HCC) - Continue statin therapy    BPH (benign prostatic hyperplasia) - Continue Flomax and Proscar    Benign essential HTN - Continue Cardizem and metoprolol     AF (paroxysmal atrial fibrillation) (HCC) - CHADS vasc score 4 - Continue AC with pradaxa (has had hemorrhagic stroke with coumadin) - Rate controlled with metoprolol, Cardizem, digoxin     Depression - Stable - Continue wellbutrin    Sinus node dysfunction - Has dual-chamber pacemaker controlling heart rate    Prior stroke with residual left hemiplegia related to hemorrhagic stroke while on Coumadin - Stable on Pradaxa, dose reduced per pharmacy dosing     Seizures as late effect of CVA / Acute encephalopathy  - Has a neurologist in [facility] health who he follows with - He is on gabapentin but this is not an ideal antiepileptic for him but will defer to his neurologist for changes in meds on outpt basis - No seizures so far    DVT prophylaxis: Pradaxa  Code Status: full code  Family Communication: wife at bedside this am Disposition Plan: home or SNF likely by Monday 3/19   Consultants:   None  Procedures:   None   Antimicrobials:  Azithromycin and rocephin 02/21/17 -->    Subjective: No overnight events.   Objective: Vitals:   02/22/17 0622 02/22/17 1323 02/22/17 2233 02/23/17 0451  BP: 127/61 (!) 97/52 125/67 108/62  Pulse: 72 69 70 70  Resp: '18 20 16 18  '$ Temp: 98.7 F (37.1 C) 99.8 F (37.7 C) 98 F (36.7 C) 98.2 F (36.8 C)  TempSrc: Oral Oral Oral   SpO2: 97% 100% 99% 98%  Weight:      Height:         Intake/Output Summary (Last 24 hours) at 02/23/17 1134 Last data filed at 02/22/17 1600  Gross per 24 hour  Intake              420 ml  Output              200 ml  Net              220 ml   Filed Weights   02/21/17 1313 02/22/17 0500  Weight: 67.1 kg (148 lb) 67.7 kg (149 lb 4 oz)    Examination:  General exam: Appears calm and comfortable, no distress  Respiratory system: wheezing in upper lung lobes  Cardiovascular system: S1 & S2 heard, Rate controlled  Gastrointestinal system: (+) BS, non tender, non distended  Central nervous system: left side spastic hemiplegia  Extremities: Symmetric 5 x 5 power. No swelling  Skin: No rashes, lesions or ulcers, skin is warm and dry  Psychiatry: Mood & affect appropriate.   Data Reviewed: I have personally reviewed following labs and imaging studies  CBC:  Recent Labs Lab 02/21/17 1428 02/21/17 1811 02/22/17 0717 02/23/17 0603  WBC 11.4* 9.4 9.3 8.9  NEUTROABS 9.1* 7.7  --   --   HGB 10.4* 10.3* 10.1* 10.4*  HCT 33.5* 32.4* 32.0* 33.4*  MCV 96.5 95.6 97.3 96.8  PLT 174 167 157 034   Basic Metabolic Panel:  Recent Labs Lab 02/21/17 1428 02/21/17 1811 02/22/17 0717 02/23/17 0603  NA 136 138 138 138  K 3.2* 3.5 3.6 3.5  CL 107 112* 111 113*  CO2 19* 16* 17* 15*  GLUCOSE 112* 107* 121* 127*  BUN 25* 25* 22* 23*  CREATININE 2.13* 1.99* 1.79* 1.68*  CALCIUM 8.6* 8.4* 8.4* 8.2*  MG  --  QUANTITY NOT SUFFICIENT, UNABLE TO PERFORM TEST  --   --   PHOS  --  2.8  --   --    GFR: Estimated Creatinine Clearance: 34.3 mL/min (A) (by C-G formula based on SCr of 1.68 mg/dL (H)). Liver Function Tests:  Recent Labs Lab 02/21/17 1428 02/21/17 1811  AST 14* 17  ALT 11* 10*  ALKPHOS 78 79  BILITOT 0.5 QUANTITY NOT SUFFICIENT, UNABLE TO PERFORM TEST  PROT 5.9* 5.9*  ALBUMIN 2.8* 2.8*   No results for input(s): LIPASE, AMYLASE in the last 168 hours. No results for input(s): AMMONIA in the last 168 hours. Coagulation  Profile: No results for input(s): INR, PROTIME in the last 168 hours. Cardiac Enzymes: No results for input(s): CKTOTAL, CKMB, CKMBINDEX, TROPONINI in the last 168 hours. BNP (last 3 results) No results for input(s): PROBNP in the last 8760 hours. HbA1C:  Recent Labs  02/21/17 1811  HGBA1C 5.6   CBG:  Recent Labs Lab 02/22/17 0801 02/22/17 1430 02/22/17 1708 02/22/17 2232 02/23/17 0812  GLUCAP 122* 157* 132* 174* 127*   Lipid Profile: No results for input(s): CHOL, HDL, LDLCALC, TRIG, CHOLHDL, LDLDIRECT in the last 72 hours. Thyroid Function  Tests: No results for input(s): TSH, T4TOTAL, FREET4, T3FREE, THYROIDAB in the last 72 hours. Anemia Panel: No results for input(s): VITAMINB12, FOLATE, FERRITIN, TIBC, IRON, RETICCTPCT in the last 72 hours. Urine analysis:    Component Value Date/Time   COLORURINE STRAW (A) 02/21/2017 2230   APPEARANCEUR CLEAR 02/21/2017 2230   LABSPEC 1.004 (L) 02/21/2017 2230   PHURINE 8.0 02/21/2017 2230   GLUCOSEU NEGATIVE 02/21/2017 2230   HGBUR SMALL (A) 02/21/2017 2230   BILIRUBINUR NEGATIVE 02/21/2017 2230   KETONESUR NEGATIVE 02/21/2017 2230   PROTEINUR NEGATIVE 02/21/2017 2230   UROBILINOGEN 2.0 (H) 02/24/2011 1719   NITRITE NEGATIVE 02/21/2017 2230   LEUKOCYTESUR NEGATIVE 02/21/2017 2230   Sepsis Labs: '@LABRCNTIP'$ (procalcitonin:4,lacticidven:4)   ) Recent Results (from the past 240 hour(s))  Blood culture (routine x 2)     Status: None (Preliminary result)   Collection Time: 02/21/17  4:20 PM  Result Value Ref Range Status   Specimen Description BLOOD RIGHT ANTECUBITAL  Final   Special Requests BOTTLES DRAWN AEROBIC ONLY 5CC  Final   Culture NO GROWTH < 24 HOURS  Final   Report Status PENDING  Incomplete  Blood culture (routine x 2)     Status: None (Preliminary result)   Collection Time: 02/21/17  6:11 PM  Result Value Ref Range Status   Specimen Description BLOOD LEFT HAND  Final   Special Requests IN PEDIATRIC BOTTLE  1CC  Final   Culture NO GROWTH < 24 HOURS  Final   Report Status PENDING  Incomplete      Radiology Studies: Dg Chest 2 View Result Date: 02/21/2017 No pneumonia, CHF, nor other acute cardiopulmonary abnormality. Thoracic aortic atherosclerosis. Electronically Signed   By: David  Martinique M.D.   On: 02/21/2017 15:21   Ct Chest Wo Contrast  Result Date: 02/21/2017 Streaky and some tree-in-bud opacities within the right lower lobe likely representing atelectasis and infection. Nonspecific left upper lobe ground-glass opacity -question infection or nonspecific inflammation. Cardiomegaly and coronary artery disease. Mild centrilobular emphysema. Electronically Signed   By: Margarette Canada M.D.   On: 02/21/2017 17:09     Scheduled Meds: . acetaZOLAMIDE  250 mg Oral TID  . atorvastatin  20 mg Oral QHS  . azithromycin  500 mg Intravenous Q24H  . buPROPion  150 mg Oral BH-q7a  . calcium citrate  200 mg of elemental calcium Oral BID  . cefTRIAXone (ROCEPHIN)  IV  1 g Intravenous Q24H  . cholecalciferol  1,000 Units Oral BH-q7a  . dabigatran  150 mg Oral BID  . digoxin  0.0625 mg Oral q morning - 10a  . diltiazem  180 mg Oral BH-q7a  . escitalopram  20 mg Oral q morning - 10a  . famotidine  20 mg Oral Daily  . ferrous sulfate  325 mg Oral Q breakfast  . finasteride  5 mg Oral BH-q7a  . gabapentin  300 mg Oral BID  . insulin aspart  0-9 Units Subcutaneous TID WC  . latanoprost  1 drop Right Eye QHS  . magnesium oxide  400 mg Oral BH-q7a  . metoprolol tartrate  12.5 mg Oral BID  . pilocarpine  2 drop Right Eye QID  . sodium chloride flush  3 mL Intravenous Q12H  . tamsulosin  0.4 mg Oral QPM   Continuous Infusions: . sodium chloride       LOS: 2 days    Time spent: 25 minutes  Greater than 50% of the time spent on counseling and coordinating the care.  Leisa Lenz, MD [facility] Pager [phone]  If 7PM-7AM, please contact night-coverage [email or web address] Password  [facility] 02/23/2017, 11:34 AM

## 2017-02-24 DIAGNOSIS — J181 Lobar pneumonia, unspecified organism: Secondary | ICD-10-CM

## 2017-02-24 LAB — BASIC METABOLIC PANEL
ANION GAP: 6 (ref 5–15)
BUN: 25 mg/dL — ABNORMAL HIGH (ref 6–20)
CHLORIDE: 119 mmol/L — AB (ref 101–111)
CO2: 15 mmol/L — ABNORMAL LOW (ref 22–32)
Calcium: 8.3 mg/dL — ABNORMAL LOW (ref 8.9–10.3)
Creatinine, Ser: 1.76 mg/dL — ABNORMAL HIGH (ref 0.61–1.24)
GFR calc non Af Amer: 36 mL/min — ABNORMAL LOW (ref >60)
GFR, EST AFRICAN AMERICAN: 42 mL/min — AB (ref >60)
Glucose, Bld: 106 mg/dL — ABNORMAL HIGH (ref 65–99)
POTASSIUM: 3.4 mmol/L — AB (ref 3.5–5.1)
SODIUM: 140 mmol/L (ref 135–145)

## 2017-02-24 LAB — CBC
HCT: 32.4 % — ABNORMAL LOW (ref 39.0–52.0)
HEMOGLOBIN: 10.2 g/dL — AB (ref 13.0–17.0)
MCH: 30.4 pg (ref 26.0–34.0)
MCHC: 31.5 g/dL (ref 30.0–36.0)
MCV: 96.4 fL (ref 78.0–100.0)
PLATELETS: 187 10*3/uL (ref 150–400)
RBC: 3.36 MIL/uL — AB (ref 4.22–5.81)
RDW: 14.6 % (ref 11.5–15.5)
WBC: 7.1 10*3/uL (ref 4.0–10.5)

## 2017-02-24 LAB — GLUCOSE, CAPILLARY
GLUCOSE-CAPILLARY: 96 mg/dL (ref 65–99)
GLUCOSE-CAPILLARY: 121 mg/dL — AB (ref 65–99)
GLUCOSE-CAPILLARY: 116 mg/dL — AB (ref 65–99)
Glucose-Capillary: 139 mg/dL — ABNORMAL HIGH (ref 65–99)

## 2017-02-24 MED ORDER — AZITHROMYCIN 500 MG PO TABS
500.0000 mg | ORAL_TABLET | Freq: Every day | ORAL | Status: DC
  Administered 2017-02-24: 500 mg via ORAL
  Filled 2017-02-24: qty 1

## 2017-02-24 MED ORDER — POTASSIUM CHLORIDE CRYS ER 20 MEQ PO TBCR
40.0000 meq | EXTENDED_RELEASE_TABLET | Freq: Once | ORAL | Status: AC
  Administered 2017-02-24: 40 meq via ORAL
  Filled 2017-02-24: qty 2

## 2017-02-24 NOTE — Progress Notes (Signed)
Pt placed on home CPAP & home mask.

## 2017-02-24 NOTE — Progress Notes (Signed)
Patient ID: Steven Duffy, male   DOB: June 19, 1941, 76 y.o.   MRN: [id number]  PROGRESS NOTE    Steven Duffy  WNI:[id number] DOB: [birth year] DOA: 02/21/2017  PCP: No PCP Per Patient   Brief Narrative:  76 y.o. 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 He presented with ongoing fatigue, weakness and cough intermittently productive of clear sputum for past 3 weeks prior to this admission. Pt also reported shortness of breath and mid chest pain associated with coughing and fevers.  In ED, pt was hemodynamically stable on admission with BP 101/62, Tmax 99.9 F, HR 70, good oxygen saturation. Blood work showed WBC count 11.4, hemoglobin 10.4, potassium 3.2 (supplemented), creatinine was 2.13 and glucose 112. CXR showed no pneumonia or CHF. CT chest showed streaky and some tree-in-bud opacities within the right lower lobe likely representing atelectasis and infection, also left upper lobe ground-glass opacity -question infection or nonspecific inflammation. Pt was started on empiric abx, azithro and Rocephin while awaiting culture results.    Assessment & Plan:   Principal Problem:   Right lower lobe pneumonia (HCC) / Leukocytosis - CXR and CT chest showed opacities in right lower lung lobe concerning for pneumonia  - Continue azithro and rocephin  - Stable resp status  - Strep pneumonia negative - HIV non reactive - Blood cx negative to date   Active Problems:   Hypokalemia - Likely from lasix - Continue to supplement     Acute renal failure  superimposed on stage 3 chronic kidney disease (HCC) - Baseline creatinine 1.48 in 12/2016 - Cr on this admission 2.13. Elevated likely from metformin and lasix and both meds placed on hold - Cr improving, 1.76 this am    Anemia of chronic disease - Likely from CKD - Hemoglobin overall stable     Dyslipidemia associated with type 2 diabetes mellitus (HCC) - Continue statin therapy    BPH (benign prostatic hyperplasia) - Continue Flomax and Proscar    Benign essential HTN - Continue Cardizem and metoprolol     AF (paroxysmal atrial fibrillation) (HCC) - CHADS vasc score 4 - Continue AC with pradaxa (has had hemorrhagic stroke with coumadin) - Rate controlled with metoprolol, Cardizem, digoxin     Depression - Stable - Continue wellbutrin    Sinus node dysfunction - Has dual-chamber pacemaker controlling heart rate    Prior stroke with residual left hemiplegia related to hemorrhagic stroke while on Coumadin - Stable on Pradaxa, dose reduced per pharmacy dosing     Seizures as late effect of CVA / Acute encephalopathy  - Has a neurologist in [facility] who he follows with - He is on gabapentin but this is not an ideal antiepileptic for him but will defer to his neurologist for changes in meds on outpt basis - No reprots of seizures    DVT prophylaxis: Pradaxa  Code Status: full code  Family Communication: wife at bedside 3/17; we discussed d/c plan to [facility] likely on Monday  Disposition Plan: to SNF 3/19   Consultants:   None  Procedures:   None   Antimicrobials:   Azithromycin and rocephin  02/21/17 -->    Subjective: No overnight events.   Objective: Vitals:   02/23/17 0451 02/23/17 1636 02/23/17 2137 02/24/17 0643  BP: 108/62 103/60 128/72 (!) 112/59  Pulse: 70 70 69 69  Resp: '18 18 17 17  '$ Temp: 98.2 F (36.8 C) 98.4 F (36.9 C) 98.6 F (37 C) 97.3 F (36.3 C)  TempSrc:  Oral Oral Axillary  SpO2: 98% 100% 100% 99%  Weight:    66.6  kg (146 lb 14.4 oz)  Height:        Intake/Output Summary (Last 24 hours) at 02/24/17 1203 Last data filed at 02/24/17 0600  Gross per 24 hour  Intake              300 ml  Output              850 ml  Net             -550 ml   Filed Weights   02/21/17 1313 02/22/17 0500 02/24/17 0643  Weight: 67.1 kg (148 lb) 67.7 kg (149 lb 4 oz) 66.6 kg (146 lb 14.4 oz)    Examination:  General exam: no distress  Respiratory system: no wheezing, diminished breath sounds  Cardiovascular system: S1 & S2 heard, Rate controlled  Gastrointestinal system: (+) BS, no tenderness  Central nervous system: left side paralysis  Extremities: no edema, palpable pulses  Skin: warm, dry skin, no ulcers  Psychiatry: Normal mood and behavior   Data Reviewed: I have personally reviewed following labs and imaging studies  CBC:  Recent Labs Lab 02/21/17 1428 02/21/17 1811 02/22/17 0717 02/23/17 0603 02/24/17 0621  WBC 11.4* 9.4 9.3 8.9 7.1  NEUTROABS 9.1* 7.7  --   --   --   HGB 10.4* 10.3* 10.1* 10.4* 10.2*  HCT 33.5* 32.4* 32.0* 33.4* 32.4*  MCV 96.5 95.6 97.3 96.8 96.4  PLT 174 167 157 177 409   Basic Metabolic Panel:  Recent Labs Lab 02/21/17 1428 02/21/17 1811 02/22/17 0717 02/23/17 0603 02/24/17 0621  NA 136 138 138 138 140  K 3.2* 3.5 3.6 3.5 3.4*  CL 107 112* 111 113* 119*  CO2 19* 16* 17* 15* 15*  GLUCOSE 112* 107* 121* 127* 106*  BUN 25* 25* 22* 23* 25*  CREATININE 2.13* 1.99* 1.79* 1.68* 1.76*  CALCIUM 8.6* 8.4* 8.4* 8.2* 8.3*  MG  --  QUANTITY NOT SUFFICIENT, UNABLE TO PERFORM TEST  --   --   --   PHOS  --  2.8  --   --   --    GFR: Estimated Creatinine Clearance: 32.7 mL/min (A) (by C-G formula based on SCr of 1.76 mg/dL (H)). Liver Function Tests:  Recent Labs Lab 02/21/17 1428 02/21/17 1811  AST 14* 17  ALT 11* 10*  ALKPHOS 78 79  BILITOT 0.5 QUANTITY NOT SUFFICIENT, UNABLE TO PERFORM TEST  PROT 5.9* 5.9*  ALBUMIN 2.8* 2.8*   No results for input(s): LIPASE,  AMYLASE in the last 168 hours. No results for input(s): AMMONIA in the last 168 hours. Coagulation Profile: No results for input(s): INR, PROTIME in the last 168 hours. Cardiac Enzymes: No results for input(s): CKTOTAL, CKMB, CKMBINDEX, TROPONINI in the last 168 hours. BNP (last 3 results) No results for input(s): PROBNP in the last 8760 hours. HbA1C:  Recent Labs  02/21/17 1811  HGBA1C 5.6   CBG:  Recent Labs Lab 02/23/17 0812 02/23/17 1210 02/23/17 1735 02/23/17 2154 02/24/17 0800  GLUCAP 127* 127* 123* 133* 96  Lipid Profile: No results for input(s): CHOL, HDL, LDLCALC, TRIG, CHOLHDL, LDLDIRECT in the last 72 hours. Thyroid Function Tests: No results for input(s): TSH, T4TOTAL, FREET4, T3FREE, THYROIDAB in the last 72 hours. Anemia Panel: No results for input(s): VITAMINB12, FOLATE, FERRITIN, TIBC, IRON, RETICCTPCT in the last 72 hours. Urine analysis:    Component Value Date/Time   COLORURINE STRAW (A) 02/21/2017 2230   APPEARANCEUR CLEAR 02/21/2017 2230   LABSPEC 1.004 (L) 02/21/2017 2230   PHURINE 8.0 02/21/2017 2230   GLUCOSEU NEGATIVE 02/21/2017 2230   HGBUR SMALL (A) 02/21/2017 2230   BILIRUBINUR NEGATIVE 02/21/2017 2230   KETONESUR NEGATIVE 02/21/2017 2230   PROTEINUR NEGATIVE 02/21/2017 2230   UROBILINOGEN 2.0 (H) 02/24/2011 1719   NITRITE NEGATIVE 02/21/2017 2230   LEUKOCYTESUR NEGATIVE 02/21/2017 2230   Sepsis Labs: '@LABRCNTIP'$ (procalcitonin:4,lacticidven:4)   ) Recent Results (from the past 240 hour(s))  Blood culture (routine x 2)     Status: None (Preliminary result)   Collection Time: 02/21/17  4:20 PM  Result Value Ref Range Status   Specimen Description BLOOD RIGHT ANTECUBITAL  Final   Special Requests BOTTLES DRAWN AEROBIC ONLY 5CC  Final   Culture NO GROWTH 2 DAYS  Final   Report Status PENDING  Incomplete  Blood culture (routine x 2)     Status: None (Preliminary result)   Collection Time: 02/21/17  6:11 PM  Result Value Ref Range  Status   Specimen Description BLOOD LEFT HAND  Final   Special Requests IN PEDIATRIC BOTTLE 1CC  Final   Culture NO GROWTH 2 DAYS  Final   Report Status PENDING  Incomplete      Radiology Studies: Dg Chest 2 View Result Date: 02/21/2017 No pneumonia, CHF, nor other acute cardiopulmonary abnormality. Thoracic aortic atherosclerosis. Electronically Signed   By: David  Martinique M.D.   On: 02/21/2017 15:21   Ct Chest Wo Contrast  Result Date: 02/21/2017 Streaky and some tree-in-bud opacities within the right lower lobe likely representing atelectasis and infection. Nonspecific left upper lobe ground-glass opacity -question infection or nonspecific inflammation. Cardiomegaly and coronary artery disease. Mild centrilobular emphysema. Electronically Signed   By: Margarette Canada M.D.   On: 02/21/2017 17:09     Scheduled Meds: . acetaZOLAMIDE  250 mg Oral TID  . atorvastatin  20 mg Oral QHS  . azithromycin  500 mg Intravenous Q24H  . buPROPion  150 mg Oral BH-q7a  . calcium citrate  200 mg of elemental calcium Oral BID  . cefTRIAXone (ROCEPHIN)  IV  1 g Intravenous Q24H  . cholecalciferol  1,000 Units Oral BH-q7a  . dabigatran  150 mg Oral BID  . digoxin  0.0625 mg Oral q morning - 10a  . diltiazem  180 mg Oral BH-q7a  . escitalopram  20 mg Oral q morning - 10a  . famotidine  20 mg Oral Daily  . ferrous sulfate  325 mg Oral Q breakfast  . finasteride  5 mg Oral BH-q7a  . gabapentin  300 mg Oral BID  . insulin aspart  0-9 Units Subcutaneous TID WC  . latanoprost  1 drop Right Eye QHS  . magnesium oxide  400 mg Oral BH-q7a  . metoprolol tartrate  12.5 mg Oral BID  . pilocarpine  2 drop Right Eye QID  . potassium chloride  40 mEq Oral Once  . sodium chloride flush  3 mL Intravenous Q12H  . tamsulosin  0.4 mg Oral QPM   Continuous Infusions: . sodium chloride  LOS: 3 days    Time spent: 15 minutes  Greater than 50% of the time spent on counseling and coordinating the  care.   Leisa Lenz, MD [facility] Pager [phone]  If 7PM-7AM, please contact night-coverage [email or web address] Password [facility] 02/24/2017, 12:03 PM

## 2017-02-24 NOTE — Progress Notes (Signed)
PHARMACIST - PHYSICIAN COMMUNICATION DR:   Charlies Silvers CONCERNING: Antibiotic IV to Oral Route Change Policy  RECOMMENDATION: This patient is receiving Azithromycin by the intravenous route.  Based on criteria approved by the Pharmacy and Therapeutics Committee, the antibiotic(s) is/are being converted to the equivalent oral dose form(s).   DESCRIPTION: These criteria include:  Patient being treated for a respiratory tract infection, urinary tract infection, cellulitis or clostridium difficile associated diarrhea if on metronidazole  The patient is not neutropenic and does not exhibit a GI malabsorption state  The patient is eating (either orally or via tube) and/or has been taking other orally administered medications for a least 24 hours  The patient is improving clinically and has a Tmax < 100.5  If you have questions about this conversion, please contact the Pharmacy Department  '[]'$   [phone] )  [facility] '[]'$   [phone] )  [facility] '[x]'$   [phone] )  [facility] '[]'$   [phone] )  [facility] '[]'$   [phone] )  [place], PharmD, BCPS Clinical Pharmacist 02/24/2017 1:04 PM

## 2017-02-25 LAB — GLUCOSE, CAPILLARY
GLUCOSE-CAPILLARY: 110 mg/dL — AB (ref 65–99)
Glucose-Capillary: 131 mg/dL — ABNORMAL HIGH (ref 65–99)

## 2017-02-25 MED ORDER — CEFPODOXIME PROXETIL 200 MG PO TABS: 200.0000 mg | ORAL_TABLET | Freq: Two times a day (BID) | ORAL | 0 refills | Status: DC

## 2017-02-25 MED ORDER — INSULIN ASPART 100 UNIT/ML ~~LOC~~ SOLN: 0.0000 [IU] | Freq: Three times a day (TID) | SUBCUTANEOUS | 11 refills | Status: DC

## 2017-02-25 MED ORDER — IPRATROPIUM-ALBUTEROL 0.5-2.5 (3) MG/3ML IN SOLN: 3.0000 mL | RESPIRATORY_TRACT | 0 refills | Status: DC | PRN

## 2017-02-25 MED ORDER — POTASSIUM CHLORIDE CRYS ER 20 MEQ PO TBCR
20.0000 meq | EXTENDED_RELEASE_TABLET | Freq: Once | ORAL | Status: AC
  Administered 2017-02-25: 20 meq via ORAL
  Filled 2017-02-25: qty 1

## 2017-02-25 NOTE — Progress Notes (Signed)
Patient will DC to: [facility] Anticipated DC date: 02/25/17 Family notified: Spouse Transport by: Sharl Ma   Per MD patient ready for DC to [facility]. RN, patient, patient's family, PACE, and facility notified of DC. Discharge Summary sent to facility. RN given number for report. DC packet on chart. Ambulance transport requested for patient.   CSW signing off.  Cedric Fishman, [place] Social Worker [phone]

## 2017-02-25 NOTE — Clinical Social Work Note (Signed)
Clinical Social Work Assessment  Patient Details  Name: Steven Duffy MRN: [id number] Date of Birth: October 16, 1941  Date of referral:  02/25/17               Reason for consult:  Facility Placement                Permission sought to share information with:  Facility Sport and exercise psychologist, Family Supports Permission granted to share information::  Yes, Verbal Permission Granted  Name::     Nature conservation officer::  SNFS  Relationship::  Spouse  Contact Information:  [phone]  Housing/Transportation Living arrangements for the past 2 months:  Single Family Home Source of Information:  Spouse Patient Interpreter Needed:  None Criminal Activity/Legal Involvement Pertinent to Current Situation/Hospitalization:  No - Comment as needed Significant Relationships:  Spouse Lives with:  Spouse Do you feel safe going back to the place where you live?  No Need for family participation in patient care:  Yes (Comment)  Care giving concerns:  CSW received consult for possible SNF placement at time of discharge. CSW spoke with patient's spouse and PACE worker, Marcie Bal, regarding PT recommendation of SNF placement at time of discharge. Patient reported that patient's spouse is currently unable to care for patient at their home given patient's current physical needs and fall risk. Patient expressed understanding of PT recommendation and is agreeable to SNF placement at time of discharge. CSW to continue to follow and assist with discharge planning needs.   Social Worker assessment / plan:  CSW spoke with patient concerning possibility of rehab at [facility] before returning home.  Employment status:  Retired Forensic scientist:  Medicaid In [place], Other (Comment Required) (PACE) PT Recommendations:  [place] / Referral to community resources:  [place]  Patient/Family's Response to care:  Patient recognizes need for rehab before returning home and is agreeable to a  SNF in [place]. Patient reported preference for [facility], through [place].  Patient/Family's Understanding of and Emotional Response to Diagnosis, Current Treatment, and Prognosis:  Patient/family is realistic regarding therapy needs and expressed being hopeful for SNF placement. Patient expressed understanding of CSW role and discharge process. No questions/concerns about plan or treatment.    Emotional Assessment Appearance:  Appears stated age Attitude/Demeanor/Rapport:  Other Affect (typically observed):  Accepting Orientation:  Oriented to Self, Oriented to Situation, Oriented to Place, Oriented to  Time Alcohol / Substance use:  Not Applicable Psych involvement (Current and /or in the community):  No (Comment)  Discharge Needs  Concerns to be addressed:  Care Coordination Readmission within the last 30 days:  No Current discharge risk:  None Barriers to Discharge:  [place] ([place])   Benard Halsted, [place] 02/25/2017, 12:50 PM

## 2017-02-25 NOTE — Progress Notes (Signed)
CSW still awaiting PASRR review.   Percell Locus Amandy Chubbuck LCSWA [phone]

## 2017-02-25 NOTE — Progress Notes (Signed)
NURSING PROGRESS NOTE  Steven Duffy [id number] Discharge Data: 02/25/2017 3:59 PM Attending Provider: Robbie Lis, MD PCP:No PCP Per Patient     [name] to be D/C'd [place] per MD order.  Discussed with the patient's wife the After Visit Summary and all questions fully answered. All IV's discontinued with no bleeding noted. All belongings returned to patient for patient to take home. Report was called to Olu, Rn at [facility].   Last Vital Signs:  Blood pressure (!) 116/57, pulse 70, temperature 97.8 F (36.6 C), temperature source Oral, resp. rate 18, height '5\' 6"'$  (1.676 m), weight 66.4 kg (146 lb 6.4 oz), SpO2 100 %.  Discharge Medication List Allergies as of 02/25/2017      Reactions   Duloxetine Nausea And Vomiting, Other (See Comments)   depression   Iodine Swelling   Lyrica [pregabalin] Other (See Comments)   Depression, suicidal    Shellfish-derived Products Swelling   Procaine Other (See Comments)   unspecified   Ciprofloxacin Rash   Penicillins Rash   Has patient had a PCN reaction causing immediate rash, facial/tongue/throat swelling, SOB or lightheadedness with hypotension: Yes Has patient had a PCN reaction causing severe rash involving mucus membranes or skin necrosis: No Has patient had a PCN reaction that required hospitalization No Has patient had a PCN reaction occurring within the last 10 years: No If all of the above answers are "NO", then may proceed with Cephalosporin use.   Tape Rash   NOT paper tape      Medication List    STOP taking these medications   guaiFENesin 100 MG/5ML liquid Commonly known as:  ROBITUSSIN   metFORMIN 500 MG tablet Commonly known as:  GLUCOPHAGE     TAKE these medications   acetaminophen 500 MG tablet Commonly known as:  TYLENOL Take 500-1,000 mg by mouth every 4 (four) hours as needed for mild pain.   acetaZOLAMIDE 250 MG tablet Commonly known as:  DIAMOX Take  250 mg by mouth 3 (three) times daily.   atorvastatin 20 MG tablet Commonly known as:  LIPITOR Take 20 mg by mouth at bedtime.   buPROPion 150 MG 24 hr tablet Commonly known as:  WELLBUTRIN XL Take 150 mg by mouth every morning.   calcium citrate 950 MG tablet Commonly known as:  CALCITRATE - dosed in mg elemental calcium Take 200 mg of elemental calcium by mouth 2 (two) times daily.   cefpodoxime 200 MG tablet Commonly known as:  VANTIN Take 1 tablet (200 mg total) by mouth 2 (two) times daily.   cholecalciferol 1000 units tablet Commonly known as:  VITAMIN D Take 1,000 Units by mouth every morning.   dabigatran 150 MG Caps capsule Commonly known as:  PRADAXA Take 150 mg by mouth 2 (two) times daily.   digoxin 0.125 MG tablet Commonly known as:  LANOXIN Take 0.0625 mg by mouth every morning.   diltiazem 180 MG 24 hr capsule Commonly known as:  DILACOR XR Take 180 mg by mouth every morning.   docusate sodium 100 MG capsule Commonly known as:  COLACE Take 100 mg by mouth daily as needed for mild constipation.   escitalopram 20 MG tablet Commonly known as:  LEXAPRO Take 20 mg by mouth every morning.   ferrous sulfate 325 (65 FE) MG tablet Take 325 mg by mouth daily with breakfast.   finasteride 5 MG tablet Commonly known as:  PROSCAR Take 5 mg by  mouth every morning.   gabapentin 300 MG capsule Commonly known as:  NEURONTIN Take 300 mg by mouth 2 (two) times daily.   insulin aspart 100 UNIT/ML injection Commonly known as:  novoLOG Inject 0-9 Units into the skin 3 (three) times daily with meals.   ipratropium-albuterol 0.5-2.5 (3) MG/3ML Soln Commonly known as:  DUONEB Take 3 mLs by nebulization every 4 (four) hours as needed.   latanoprost 0.005 % ophthalmic solution Commonly known as:  XALATAN Place 1 drop into the right eye at bedtime.   LUMIGAN 0.01 % Soln Generic drug:  bimatoprost Place 1 drop into the right eye at bedtime as needed.   magnesium  oxide 400 MG tablet Commonly known as:  MAG-OX Take 400 mg by mouth every morning.   metoprolol tartrate 25 MG tablet Commonly known as:  LOPRESSOR Take 12.5 mg by mouth 2 (two) times daily.   pilocarpine 1 % ophthalmic solution Commonly known as:  PILOCAR Place 2 drops into the right eye 4 (four) times daily.   ranitidine 300 MG tablet Commonly known as:  ZANTAC Take 300 mg by mouth at bedtime.   tamsulosin 0.4 MG Caps capsule Commonly known as:  FLOMAX Take 0.4 mg by mouth every evening.

## 2017-02-25 NOTE — Progress Notes (Signed)
Physical Therapy Treatment Patient Details Name: Steven Duffy MRN: [id number] DOB: Apr 22, 1941 Today's Date: 02/25/2017    History of Present Illness Pt is a 76 y.o. 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 Pt admitted for pneumonia.    PT Comments    Pt making gradual progress with mobility. Based upon the patient's current mobility, recommending SNF for further rehabilitation following acute stay. Pt discussed with physician during session and in agreement. PT to continue to follow as tolerated.    Follow Up Recommendations  SNF     Equipment Recommendations   (to be addressed at next venue)    Recommendations for Other Services       Precautions / Restrictions Precautions Precautions: Fall Required Braces or Orthoses: Other Brace/Splint Other Brace/Splint: Lt AFO Restrictions Weight Bearing Restrictions: No    Mobility  Bed Mobility Overal bed mobility: Needs Assistance Bed Mobility: Supine to Sit     Supine to sit: +2 for physical assistance;Max assist     General bed mobility comments: Assist provided with LEs and trunk to come to sitting.   Transfers Overall transfer level: Needs assistance   Transfers: Stand Pivot Transfers   Stand pivot transfers: +2 physical assistance;Max assist       General transfer comment: bilateral assist under arms and gait belt to stand, pivoting toward Rt to sit in chiar.   Ambulation/Gait                 Stairs            Wheelchair Mobility    Modified Rankin (Stroke Patients Only)       Balance Overall balance assessment: Needs assistance Sitting-balance support: Single extremity supported Sitting balance-Leahy Scale: Fair Sitting balance - Comments: min guard for sitting balance  with Rt UE support   Standing balance support: Bilateral upper extremity supported Standing balance-Leahy Scale: Poor Standing balance comment: requiring +2 physical assist                    Cognition Arousal/Alertness: Awake/alert Behavior During Therapy: WFL for tasks assessed/performed Overall Cognitive Status: Within Functional Limits for tasks assessed                      Exercises      General Comments        Pertinent Vitals/Pain Pain Assessment: No/denies pain    Home Living                      Prior Function            PT Goals (current goals can now be found in the care plan section) Acute Rehab PT Goals Patient Stated Goal: not expressed PT Goal Formulation: With patient/family Time For Goal Achievement: 03/08/17 Potential to Achieve Goals: Fair Progress towards PT goals: Progressing toward goals    Frequency    Min 3X/week      PT Plan Discharge plan needs to be updated    Co-evaluation             End of Session Equipment Utilized During Treatment: Gait belt Activity Tolerance: Patient tolerated treatment well Patient left: in chair;with call bell/phone within reach;with nursing/sitter in room Nurse Communication: Mobility status;Need for lift equipment PT Visit Diagnosis: Muscle weakness (generalized) (M62.81);Other abnormalities of gait  and mobility (R26.89)     Time: 6168-3729 PT Time Calculation (min) (ACUTE ONLY): 22 min  Charges:  $Therapeutic Activity: 8-22 mins                    G Codes:       Cassell Clement, PT, CSCS Pager [phone] Office [phone] 02/25/2017, 8:59 AM

## 2017-02-25 NOTE — Progress Notes (Signed)
Pasrr received: [id number] [place] LCSWA [phone]

## 2017-02-25 NOTE — Clinical Social Work Placement (Signed)
   CLINICAL SOCIAL WORK PLACEMENT  NOTE  Date:  02/25/2017  Patient Details  Name: Steven Duffy MRN: [id number] Date of Birth: [birth year]  Clinical Social Work is seeking post-discharge placement for this patient at the [place] level of care (*CSW will initial, date and re-position this form in  chart as items are completed):  Yes   Patient/family provided with [place] Work Department's list of facilities offering this level of care within the geographic area requested by the patient (or if unable, by the patient's family).  Yes   Patient/family informed of their freedom to choose among providers that offer the needed level of care, that participate in Medicare, Medicaid or managed care program needed by the patient, have an available bed and are willing to accept the patient.  Yes   Patient/family informed of [facility]'s ownership interest in [facility] and [facility], as well as of the fact that they are under no obligation to receive care at these facilities.  PASRR submitted to EDS on 02/22/17     PASRR number received on 02/25/17     Existing PASRR number confirmed on       FL2 transmitted to all facilities in geographic area requested by pt/family on 02/22/17     FL2 transmitted to all facilities within larger geographic area on       Patient informed that his/her managed care company has contracts with or will negotiate with certain facilities, including the following:        Yes   Patient/family informed of bed offers received.  Patient chooses bed at [facility]     Physician recommends and patient chooses bed at      Patient to be transferred to [facility] on 02/25/17.  Patient to be transferred to facility by [facility]     Patient family notified on 02/25/17 of transfer.  Name of family member notified:  Spouse     PHYSICIAN       Additional Comment:     _______________________________________________ Benard Halsted, Petronila 02/25/2017, 3:32 PM

## 2017-02-25 NOTE — Discharge Summary (Addendum)
Physician Discharge Summary  Steven Duffy QIW:[id number] DOB: May 31, 1941 DOA: 02/21/2017  PCP: No PCP Per Patient  Admit date: 02/21/2017 Discharge date: 02/25/2017  Recommendations for Outpatient Follow-up:  Continue cefpodoxime for 7 days on discharge  Recheck Creatinine in next 1 week to make sure it remains stable. Cr prior to discharge 1.76. Use sliding scale insulin for CBG control, metformin on hold due to renal insufficiency.  Discharge Diagnoses:  Principal Problem:   Right lower lobe pneumonia ([place]) Active Problems:   Hypokalemia   Acute renal failure superimposed on stage 3 chronic kidney disease (HCC)   Leukocytosis   Anemia of chronic disease   Dyslipidemia associated with type 2 diabetes mellitus (HCC)   BPH (benign prostatic hyperplasia)   Benign essential HTN   AF (paroxysmal atrial fibrillation) ([place])   Depression    Discharge Condition: stable   Diet recommendation: as tolerated   History of present illness:  76 y.o.malewith medical history significant for hemorrhagic stroke in 2012 (while on coumadin so now on pradaxa for secondary stroke prevention) with residual left spastic hemiplegia, hypertension, diabetes, paroxysmal atrial fibrillation (CHADS vasc score 4) and takes metoprolol for rate control, 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 He presented with ongoing fatigue, weakness and cough intermittently productive of clear sputum for past 3 weeks prior to this admission. Pt also reported shortness of breath and mid chest pain associated with coughing and fevers.  In ED, pt was hemodynamically stable on admission with BP 101/62, Tmax 99.9 F, HR 70, good oxygen saturation. Blood work showed WBC count 11.4, hemoglobin 10.4, potassium 3.2 (supplemented), creatinine was 2.13 and glucose 112. CXR showed no  pneumonia or CHF. CT chest showed streaky and some tree-in-bud opacities within the right lower lobe likely representing atelectasis and infection, also left upper lobe ground-glass opacity -question infection or nonspecific inflammation. Pt was started on empiric abx, azithro and Rocephin while awaiting culture results.   Hospital Course:   Principal Problem: Right lower lobe pneumonia ([place]) / Leukocytosis - CXR and CT chest showed opacities in right lower lung lobe concerning for pneumonia  - Continue azithro and rocephin through today  - Continue cefpodoxime for 7 days on discharge  - Stable resp status  - Strep pneumonia negative - HIV non reactive - Blood cx negative to date   Active Problems: Hypokalemia - Supplemented   Acute renal failure superimposed on stage 3 chronic kidney disease (HCC) - Baseline creatinine 1.48 in 12/2016 - Cr on this admission 2.13. Metformin on hold - Cr improved since admission - Pt has good urine output   Anemia of chronic disease - Likely from CKD - Hemoglobin overall stable   Dyslipidemia associated with type 2 diabetes mellitus (HCC) - Continue statin therapy  BPH (benign prostatic hyperplasia) - Continue Flomax and Proscar  Benign essential HTN - Continue Cardizem and metoprolol   AF (paroxysmal atrial fibrillation) (HCC) - CHADS vasc score 4 - Continue AC with pradaxa (has had hemorrhagic stroke with coumadin) - Rate controlled with metoprolol, Cardizem, digoxin   Depression - Stable - Continue wellbutrin  Sinus node dysfunction - Has dual-chamber pacemaker controlling heart rate  Prior stroke with residual left hemiplegia related to hemorrhagic stroke while on Coumadin - Stable on Pradaxa, dose reduced per pharmacy dosing   Seizures as late effect of CVA / Acute metabolic encephalopathy  - Has a  neurologist in [facility] who he follows with - He is on gabapentin but this is not an ideal  antiepileptic for him but will defer to his neurologist for changes in meds on outpt basis - No reprots of seizures    DVT prophylaxis: Pradaxa  Code Status: full code  Family Communication:family sitter at bedside this am; wife at bedside 3/17, we discussed d/c plan to [facility] likely on Monday    Consultants:   None  Procedures:   None   Antimicrobials:   Azithromycin and rocephin 02/21/17 --> 02/25/2017  Signed:  Leisa Lenz, MD  [facility] 02/25/2017, 10:35 AM  Pager #: [phone]  Time spent in minutes: less than 30 minutes    Discharge Exam: Vitals:   02/24/17 2226 02/25/17 0536  BP: 128/70 118/69  Pulse: 70 69  Resp: 16 12  Temp: 99.2 F (37.3 C) 98.4 F (36.9 C)   Vitals:   02/24/17 0643 02/24/17 1622 02/24/17 2226 02/25/17 0536  BP: (!) 112/59 108/64 128/70 118/69  Pulse: 69 70 70 69  Resp: '17 18 16 12  '$ Temp: 97.3 F (36.3 C) 97.3 F (36.3 C) 99.2 F (37.3 C) 98.4 F (36.9 C)  TempSrc: Axillary  Oral Oral  SpO2: 99% 100% 100% 100%  Weight: 66.6 kg (146 lb 14.4 oz)   66.4 kg (146 lb 6.4 oz)  Height:        General: Pt is alert, follows commands appropriately, not in acute distress Cardiovascular: Regular rate and rhythm, S1/S2 + Respiratory: Clear to auscultation bilaterally, no wheezing Abdominal: Soft, non tender, non distended, bowel sounds +, no guarding Extremities: no edema, no cyanosis, pulses palpable bilaterally DP and PT Neuro: left side hemiplegia   Discharge Instructions  Discharge Instructions    Call MD for:  persistant nausea and vomiting    Complete by:  As directed    Call MD for:  redness, tenderness, or signs of infection (pain, swelling, redness, odor or green/yellow discharge around incision site)    Complete by:  As directed    Call MD for:  severe uncontrolled pain    Complete by:  As directed    Diet - low sodium heart healthy    Complete by:  As directed    Discharge instructions    Complete  by:  As directed    Continue cefpodoxime for 7 days on discharge  Recheck Creatinine in next 1 week to make sure it remains stable. Cr prior to discharge 1.76. Use sliding scale insulin for CBG control, metformin on hold due to renal insufficiency.   Increase activity slowly    Complete by:  As directed      Allergies as of 02/25/2017      Reactions   Duloxetine Nausea And Vomiting, Other (See Comments)   depression   Iodine Swelling   Lyrica [pregabalin] Other (See Comments)   Depression, suicidal    Shellfish-derived Products Swelling   Procaine Other (See Comments)   unspecified   Ciprofloxacin Rash   Penicillins Rash   Has patient had a PCN reaction causing immediate rash, facial/tongue/throat swelling, SOB or lightheadedness with hypotension: Yes Has patient had a PCN reaction causing severe rash involving mucus membranes or skin necrosis: No Has patient had a PCN reaction that required hospitalization No Has patient had a PCN reaction occurring within the last 10 years: No If all of the above answers are "NO", then may proceed with Cephalosporin use.   Tape Rash   NOT paper  tape      Medication List    STOP taking these medications   guaiFENesin 100 MG/5ML liquid Commonly known as:  ROBITUSSIN   metFORMIN 500 MG tablet Commonly known as:  GLUCOPHAGE     TAKE these medications   acetaminophen 500 MG tablet Commonly known as:  TYLENOL Take 500-1,000 mg by mouth every 4 (four) hours as needed for mild pain.   acetaZOLAMIDE 250 MG tablet Commonly known as:  DIAMOX Take 250 mg by mouth 3 (three) times daily.   atorvastatin 20 MG tablet Commonly known as:  LIPITOR Take 20 mg by mouth at bedtime.   buPROPion 150 MG 24 hr tablet Commonly known as:  WELLBUTRIN XL Take 150 mg by mouth every morning.   calcium citrate 950 MG tablet Commonly known as:  CALCITRATE - dosed in mg elemental calcium Take 200 mg of elemental calcium by mouth 2 (two) times daily.    cefpodoxime 200 MG tablet Commonly known as:  VANTIN Take 1 tablet (200 mg total) by mouth 2 (two) times daily.   cholecalciferol 1000 units tablet Commonly known as:  VITAMIN D Take 1,000 Units by mouth every morning.   dabigatran 150 MG Caps capsule Commonly known as:  PRADAXA Take 150 mg by mouth 2 (two) times daily.   digoxin 0.125 MG tablet Commonly known as:  LANOXIN Take 0.0625 mg by mouth every morning.   diltiazem 180 MG 24 hr capsule Commonly known as:  DILACOR XR Take 180 mg by mouth every morning.   docusate sodium 100 MG capsule Commonly known as:  COLACE Take 100 mg by mouth daily as needed for mild constipation.   escitalopram 20 MG tablet Commonly known as:  LEXAPRO Take 20 mg by mouth every morning.   ferrous sulfate 325 (65 FE) MG tablet Take 325 mg by mouth daily with breakfast.   finasteride 5 MG tablet Commonly known as:  PROSCAR Take 5 mg by mouth every morning.   gabapentin 300 MG capsule Commonly known as:  NEURONTIN Take 300 mg by mouth 2 (two) times daily.   insulin aspart 100 UNIT/ML injection Commonly known as:  novoLOG Inject 0-9 Units into the skin 3 (three) times daily with meals.   ipratropium-albuterol 0.5-2.5 (3) MG/3ML Soln Commonly known as:  DUONEB Take 3 mLs by nebulization every 4 (four) hours as needed.   latanoprost 0.005 % ophthalmic solution Commonly known as:  XALATAN Place 1 drop into the right eye at bedtime.   LUMIGAN 0.01 % Soln Generic drug:  bimatoprost Place 1 drop into the right eye at bedtime as needed.   magnesium oxide 400 MG tablet Commonly known as:  MAG-OX Take 400 mg by mouth every morning.   metoprolol tartrate 25 MG tablet Commonly known as:  LOPRESSOR Take 12.5 mg by mouth 2 (two) times daily.   pilocarpine 1 % ophthalmic solution Commonly known as:  PILOCAR Place 2 drops into the right eye 4 (four) times daily.   ranitidine 300 MG tablet Commonly known as:  ZANTAC Take 300 mg by mouth  at bedtime.   tamsulosin 0.4 MG Caps capsule Commonly known as:  FLOMAX Take 0.4 mg by mouth every evening.         The results of significant diagnostics from this hospitalization (including imaging, microbiology, ancillary and laboratory) are listed below for reference.    Significant Diagnostic Studies: Dg Chest 2 View  Result Date: 02/21/2017 CLINICAL DATA:  Cough, fatigue, and fever for the past 2-3 days.  History of previous CVA with left-sided paralysis, diabetes, coronary artery disease, previous episodes of pneumonia, former smoker. EXAM: CHEST  2 VIEW COMPARISON:  Portable chest x-ray of February 14, 2011 FINDINGS: The lungs are adequately inflated. There is no focal infiltrate. There is no pleural effusion. The heart and pulmonary vascularity are normal. The mediastinum is normal in width. There is calcification in the wall of the aortic arch. The ICD is in stable position. The bony thorax exhibits no acute abnormality. IMPRESSION: No pneumonia, CHF, nor other acute cardiopulmonary abnormality. Thoracic aortic atherosclerosis. Electronically Signed   By: David  Martinique M.D.   On: 02/21/2017 15:21   Ct Chest Wo Contrast  Result Date: 02/21/2017 CLINICAL DATA:  Weakness cough and chest pain. EXAM: CT CHEST WITHOUT CONTRAST TECHNIQUE: Multidetector CT imaging of the chest was performed following the standard protocol without IV contrast. COMPARISON:  02/21/2017 and prior radiographs. FINDINGS: Cardiovascular: Cardiomegaly and coronary artery calcifications noted. A left-sided pacemaker with leads in the region the right atrium right ventricle are noted. There is no evidence of thoracic aortic aneurysm or pericardial effusion. Mediastinum/Nodes: No enlarged mediastinal or axillary lymph nodes. Thyroid gland, trachea, and esophagus demonstrate no significant findings. Lungs/Pleura: Ground-glass opacity within the central aspect of the left upper lobe noted. Streaky and some tree-in-bud opacities  within the right lower lobe identified. No definite airspace disease or consolidation noted. No suspicious nodule or mass identified. Mild centrilobular emphysema is noted. There is no evidence of pleural effusion or pneumothorax. Upper Abdomen: No acute abnormality Musculoskeletal: No acute or suspicious abnormality. IMPRESSION: Streaky and some tree-in-bud opacities within the right lower lobe likely representing atelectasis and infection. Nonspecific left upper lobe ground-glass opacity -question infection or nonspecific inflammation. Cardiomegaly and coronary artery disease. Mild centrilobular emphysema. Electronically Signed   By: Margarette Canada M.D.   On: 02/21/2017 17:09    Microbiology: Recent Results (from the past 240 hour(s))  Blood culture (routine x 2)     Status: None (Preliminary result)   Collection Time: 02/21/17  4:20 PM  Result Value Ref Range Status   Specimen Description BLOOD RIGHT ANTECUBITAL  Final   Special Requests BOTTLES DRAWN AEROBIC ONLY 5CC  Final   Culture NO GROWTH 3 DAYS  Final   Report Status PENDING  Incomplete  Blood culture (routine x 2)     Status: None (Preliminary result)   Collection Time: 02/21/17  6:11 PM  Result Value Ref Range Status   Specimen Description BLOOD LEFT HAND  Final   Special Requests IN PEDIATRIC BOTTLE 1CC  Final   Culture NO GROWTH 3 DAYS  Final   Report Status PENDING  Incomplete     Labs: Basic Metabolic Panel:  Recent Labs Lab 02/21/17 1428 02/21/17 1811 02/22/17 0717 02/23/17 0603 02/24/17 0621  NA 136 138 138 138 140  K 3.2* 3.5 3.6 3.5 3.4*  CL 107 112* 111 113* 119*  CO2 19* 16* 17* 15* 15*  GLUCOSE 112* 107* 121* 127* 106*  BUN 25* 25* 22* 23* 25*  CREATININE 2.13* 1.99* 1.79* 1.68* 1.76*  CALCIUM 8.6* 8.4* 8.4* 8.2* 8.3*  MG  --  QUANTITY NOT SUFFICIENT, UNABLE TO PERFORM TEST  --   --   --   PHOS  --  2.8  --   --   --    Liver Function Tests:  Recent Labs Lab 02/21/17 1428 02/21/17 1811  AST 14* 17   ALT 11* 10*  ALKPHOS 78 79  BILITOT 0.5 QUANTITY NOT  SUFFICIENT, UNABLE TO PERFORM TEST  PROT 5.9* 5.9*  ALBUMIN 2.8* 2.8*   No results for input(s): LIPASE, AMYLASE in the last 168 hours. No results for input(s): AMMONIA in the last 168 hours. CBC:  Recent Labs Lab 02/21/17 1428 02/21/17 1811 02/22/17 0717 02/23/17 0603 02/24/17 0621  WBC 11.4* 9.4 9.3 8.9 7.1  NEUTROABS 9.1* 7.7  --   --   --   HGB 10.4* 10.3* 10.1* 10.4* 10.2*  HCT 33.5* 32.4* 32.0* 33.4* 32.4*  MCV 96.5 95.6 97.3 96.8 96.4  PLT 174 167 157 177 187   Cardiac Enzymes: No results for input(s): CKTOTAL, CKMB, CKMBINDEX, TROPONINI in the last 168 hours. BNP: BNP (last 3 results) No results for input(s): BNP in the last 8760 hours.  ProBNP (last 3 results) No results for input(s): PROBNP in the last 8760 hours.  CBG:  Recent Labs Lab 02/24/17 0800 02/24/17 1232 02/24/17 1723 02/24/17 2226 02/25/17 0824  GLUCAP 96 121* 116* 139* 110*

## 2017-02-25 NOTE — Progress Notes (Signed)
Pt placed on home CPAP.

## 2017-02-26 LAB — CULTURE, BLOOD (ROUTINE X 2)
CULTURE: NO GROWTH
CULTURE: NO GROWTH

## 2017-03-07 ENCOUNTER — Ambulatory Visit
Admission: RE | Admit: 2017-03-07 | Discharge: 2017-03-07 | Disposition: A | Discharge status: Home / Self Care | Payer: Medicare (Managed Care) | Source: Ambulatory Visit | Attending: Nurse Practitioner | Admitting: Nurse Practitioner

## 2017-03-07 ENCOUNTER — Other Ambulatory Visit: Payer: Self-pay | Admitting: Nurse Practitioner

## 2017-03-07 DIAGNOSIS — R05 Cough: Secondary | ICD-10-CM

## 2017-03-07 DIAGNOSIS — R531 Weakness: Secondary | ICD-10-CM

## 2017-03-07 DIAGNOSIS — R059 Cough, unspecified: Secondary | ICD-10-CM

## 2017-03-29 ENCOUNTER — Encounter: Primary: Family Medicine

## 2017-04-02 ENCOUNTER — Encounter: Primary: Family Medicine

## 2017-04-03 ENCOUNTER — Encounter

## 2017-04-03 ENCOUNTER — Encounter: Admit: 2017-04-03 | Payer: MEDICARE | Primary: Family Medicine

## 2017-04-03 LAB — COMPREHENSIVE METABOLIC PANEL
ALT: 17 U/L (ref 0–41)
AST: 20 U/L (ref 0–40)
Albumin: 3.8 g/dL — ABNORMAL LOW (ref 3.9–4.9)
Alkaline Phosphatase: 83 U/L (ref 35–104)
Anion Gap: 14 mEq/L — ABNORMAL HIGH (ref 7–13)
BUN: 15 mg/dL (ref 8–23)
CO2: 24 mEq/L (ref 22–29)
Calcium: 8.3 mg/dL — ABNORMAL LOW (ref 8.6–10.2)
Chloride: 101 mEq/L (ref 98–107)
Creatinine: 0.75 mg/dL (ref 0.70–1.20)
GFR African American: >60 (ref >60)
GFR Non-African American: >60 (ref >60)
Globulin: 2.7 g/dL (ref 2.3–3.5)
Glucose: 97 mg/dL (ref 74–109)
Potassium: 4.7 mEq/L (ref 3.5–5.1)
Sodium: 139 mEq/L (ref 132–144)
Total Bilirubin: 0.4 mg/dL (ref 0.0–1.2)
Total Protein: 6.5 g/dL (ref 6.4–8.1)

## 2017-04-03 LAB — CBC WITH AUTO DIFFERENTIAL
Basophils %: 1.1 %
Basophils Absolute: 0.1 10*3/uL (ref 0.0–0.2)
Eosinophils %: 3 %
Eosinophils Absolute: 0.2 10*3/uL (ref 0.0–0.7)
Hematocrit: 44.7 % (ref 42.0–52.0)
Hemoglobin: 15.3 g/dL (ref 14.0–18.0)
Lymphocytes %: 19.2 %
Lymphocytes Absolute: 1.1 10*3/uL (ref 1.0–4.8)
MCH: 30.4 pg (ref 27.0–31.3)
MCHC: 34.2 % (ref 33.0–37.0)
MCV: 88.8 fL (ref 80.0–100.0)
Monocytes %: 6.4 %
Monocytes Absolute: 0.4 10*3/uL (ref 0.2–0.8)
Neutrophils %: 70.3 %
Neutrophils Absolute: 3.9 10*3/uL (ref 1.4–6.5)
PLATELET SLIDE REVIEW: DECREASED
Platelets: 119 10*3/uL — ABNORMAL LOW (ref 130–400)
RBC: 5.03 M/uL (ref 4.70–6.10)
RDW: 14.2 % (ref 11.5–14.5)
WBC: 5.6 10*3/uL (ref 4.8–10.8)

## 2017-04-03 LAB — LIPID PANEL
Cholesterol, Total: 154 mg/dL (ref 0–199)
HDL: 46 mg/dL (ref 40–59)
LDL Calculated: 86 mg/dL (ref 0–129)
Triglycerides: 110 mg/dL (ref 0–200)

## 2017-04-09 ENCOUNTER — Encounter: Attending: Family Medicine | Primary: Family Medicine

## 2017-05-01 DIAGNOSIS — H44511 Absolute glaucoma, right eye: Secondary | ICD-10-CM | POA: Insufficient documentation

## 2017-05-13 NOTE — Addendum Note (Signed)
Addendum  created 05/13/17 1029 by Myrtie Soman, MD   Sign clinical note

## 2017-08-27 ENCOUNTER — Other Ambulatory Visit (HOSPITAL_COMMUNITY): Payer: Self-pay | Admitting: Urology

## 2017-08-27 ENCOUNTER — Other Ambulatory Visit: Payer: Self-pay | Admitting: Urology

## 2017-08-27 DIAGNOSIS — N200 Calculus of kidney: Secondary | ICD-10-CM

## 2017-10-02 NOTE — Patient Instructions (Signed)
Steven Duffy  10/02/2017   Your procedure is scheduled on: 10/07/2017    Report to [facility] Main  Entrance Go To Radiology at 0730am then  Take [facility]  elevators to 3rd floor to  [place] at AM.   Call this number if you have problems the morning of surgery [phone]    Remember: ONLY 1 PERSON MAY GO WITH YOU TO SHORT STAY TO GET  READY MORNING OF [place].  Do not eat food or drink liquids :After Midnight.     Take these medicines the morning of surgery with A SIP OF WATER: Wellbutrin, lanoxin, diltiazem ( Dilacor), Lexapro, Proscar, Gabapentin, eye drops as usual, Metoprolol ( Lopressor), Duoneb if needed  DO NOT TAKE ANY DIABETIC MEDICATIONS DAY OF YOUR SURGERY                               You may not have any metal on your body including hair pins and              piercings  Do not wear jewelry,, lotions, powders or perfumes, deodorant                           Men may shave face and neck.   Do not bring valuables to the hospital. [place].  Contacts, dentures or bridgework may not be worn into surgery.  Leave suitcase in the car. After surgery it may be brought to your room.                     Please read over the following fact sheets you were given: _____________________________________________________________________             [facility] - Preparing for Surgery Before surgery, you can play an important role.  Because skin is not sterile, your skin needs to be as free of germs as possible.  You can reduce the number of germs on your skin by washing with CHG (chlorahexidine gluconate) soap before surgery.  CHG is an antiseptic cleaner which kills germs and bonds with the skin to continue killing germs even after washing. Please DO NOT use if you have an allergy to CHG or antibacterial soaps.  If your skin becomes reddened/irritated stop using the CHG and inform your nurse  when you arrive at Short Stay. Do not shave (including legs and underarms) for at least 48 hours prior to the first CHG shower.  You may shave your face/neck. Please follow these instructions carefully:  1.  Shower with CHG Soap the night before surgery and the  morning of Surgery.  2.  If you choose to wash your hair, wash your hair first as usual with your  normal  shampoo.  3.  After you shampoo, rinse your hair and body thoroughly to remove the  shampoo.                           4.  Use CHG as you would any other liquid soap.  You can apply chg directly  to the skin and wash  Gently with a scrungie or clean washcloth.  5.  Apply the CHG Soap to your body ONLY FROM THE NECK DOWN.   Do not use on face/ open                           Wound or open sores. Avoid contact with eyes, ears mouth and genitals (private parts).                       Wash face,  Genitals (private parts) with your normal soap.             6.  Wash thoroughly, paying special attention to the area where your surgery  will be performed.  7.  Thoroughly rinse your body with warm water from the neck down.  8.  DO NOT shower/wash with your normal soap after using and rinsing off  the CHG Soap.                9.  Pat yourself dry with a clean towel.            10.  Wear clean pajamas.            11.  Place clean sheets on your bed the night of your first shower and do not  sleep with pets. Day of Surgery : Do not apply any lotions/deodorants the morning of surgery.  Please wear clean clothes to the hospital/surgery center.  FAILURE TO FOLLOW THESE INSTRUCTIONS MAY RESULT IN THE CANCELLATION OF YOUR SURGERY PATIENT SIGNATURE_________________________________  NURSE SIGNATURE__________________________________  ________________________________________________________________________ How to Manage Your Diabetes Before and After Surgery  Why is it important to control my blood sugar before and after  surgery? . Improving blood sugar levels before and after surgery helps healing and can limit problems. . A way of improving blood sugar control is eating a healthy diet by: o  Eating less sugar and carbohydrates o  Increasing activity/exercise o  Talking with your doctor about reaching your blood sugar goals . High blood sugars (greater than 180 mg/dL) can raise your risk of infections and slow your recovery, so you will need to focus on controlling your diabetes during the weeks before surgery. . Make sure that the doctor who takes care of your diabetes knows about your planned surgery including the date and location.  How do I manage my blood sugar before surgery? . Check your blood sugar at least 4 times a day, starting 2 days before surgery, to make sure that the level is not too high or low. o Check your blood sugar the morning of your surgery when you wake up and every 2 hours until you get to the Short Stay unit. . If your blood sugar is less than 70 mg/dL, you will need to treat for low blood sugar: o Do not take insulin. o Treat a low blood sugar (less than 70 mg/dL) with  cup of clear juice (cranberry or apple), 4 glucose tablets, OR glucose gel. o Recheck blood sugar in 15 minutes after treatment (to make sure it is greater than 70 mg/dL). If your blood sugar is not greater than 70 mg/dL on recheck, call [phone] for further instructions. . Report your blood sugar to the short stay nurse when you get to Short Stay.  . If you are admitted to the hospital after surgery: o Your blood sugar will be checked by the staff and you will probably be  given insulin after surgery (instead of oral diabetes medicines) to make sure you have good blood sugar levels. o The goal for blood sugar control after surgery is 80-180 mg/dL.   WHAT DO I DO ABOUT MY DIABETES MEDICATION?  Marland Kitchen Do not take oral diabetes medicines (pills) the morning of surgery.  . THE NIGHT BEFORE SURGERY, take     units of        insulin.       . THE MORNING OF SURGERY, take   units of         insulin.  . The day of surgery, do not take other diabetes injectables, including Byetta (exenatide), Bydureon (exenatide ER), Victoza (liraglutide), or Trulicity (dulaglutide).  . If your CBG is greater than 220 mg/dL, you may take  of your sliding scale  . (correction) dose of insulin.    For patients with insulin pumps: Contact your diabetes doctor for specific instructions before surgery. Decrease basal rates by 20% at midnight the night before your surgery. Note that if your surgery is planned to be longer than 2 hours, your insulin pump will be removed and intravenous (IV) insulin will be started and managed by the nurses and the anesthesiologist. You will be able to restart your insulin pump once you are awake and able to manage it.  Make sure to bring insulin pump supplies to the hospital with you in case the  site needs to be changed.  Patient Signature:  Date:   Nurse Signature:  Date:   Reviewed and Endorsed by [facility] Patient Education Committee, August 2015

## 2017-10-03 ENCOUNTER — Encounter (HOSPITAL_COMMUNITY)
Admission: RE | Admit: 2017-10-03 | Discharge: 2017-10-03 | Disposition: A | Discharge status: Home / Self Care | Payer: Medicare (Managed Care) | Source: Ambulatory Visit | Attending: Urology | Admitting: Urology

## 2017-10-03 ENCOUNTER — Encounter (HOSPITAL_COMMUNITY): Payer: Self-pay

## 2017-10-03 ENCOUNTER — Other Ambulatory Visit: Payer: Self-pay | Admitting: General Surgery

## 2017-10-03 DIAGNOSIS — E785 Hyperlipidemia, unspecified: Secondary | ICD-10-CM | POA: Diagnosis not present

## 2017-10-03 DIAGNOSIS — E119 Type 2 diabetes mellitus without complications: Secondary | ICD-10-CM | POA: Insufficient documentation

## 2017-10-03 DIAGNOSIS — Z01818 Encounter for other preprocedural examination: Secondary | ICD-10-CM | POA: Diagnosis present

## 2017-10-03 DIAGNOSIS — Z79899 Other long term (current) drug therapy: Secondary | ICD-10-CM | POA: Insufficient documentation

## 2017-10-03 DIAGNOSIS — F329 Major depressive disorder, single episode, unspecified: Secondary | ICD-10-CM | POA: Insufficient documentation

## 2017-10-03 DIAGNOSIS — I10 Essential (primary) hypertension: Secondary | ICD-10-CM | POA: Diagnosis not present

## 2017-10-03 DIAGNOSIS — N200 Calculus of kidney: Secondary | ICD-10-CM | POA: Insufficient documentation

## 2017-10-03 LAB — CBC
HEMATOCRIT: 39.4 % (ref 39.0–52.0)
Hemoglobin: 12.8 g/dL — ABNORMAL LOW (ref 13.0–17.0)
MCH: 31.7 pg (ref 26.0–34.0)
MCHC: 32.5 g/dL (ref 30.0–36.0)
MCV: 97.5 fL (ref 78.0–100.0)
Platelets: 183 10*3/uL (ref 150–400)
RBC: 4.04 MIL/uL — AB (ref 4.22–5.81)
RDW: 14 % (ref 11.5–15.5)
WBC: 7.6 10*3/uL (ref 4.0–10.5)

## 2017-10-03 LAB — BASIC METABOLIC PANEL
ANION GAP: 5 (ref 5–15)
BUN: 27 mg/dL — ABNORMAL HIGH (ref 6–20)
CALCIUM: 8.9 mg/dL (ref 8.9–10.3)
CO2: 22 mmol/L (ref 22–32)
Chloride: 114 mmol/L — ABNORMAL HIGH (ref 101–111)
Creatinine, Ser: 1.58 mg/dL — ABNORMAL HIGH (ref 0.61–1.24)
GFR calc non Af Amer: 41 mL/min — ABNORMAL LOW (ref >60)
GFR, EST AFRICAN AMERICAN: 47 mL/min — AB (ref >60)
Glucose, Bld: 213 mg/dL — ABNORMAL HIGH (ref 65–99)
Potassium: 4.2 mmol/L (ref 3.5–5.1)
Sodium: 141 mmol/L (ref 135–145)

## 2017-10-03 LAB — HEMOGLOBIN A1C
HEMOGLOBIN A1C: 6.9 % — AB (ref 4.8–5.6)
Mean Plasma Glucose: 151.33 mg/dL

## 2017-10-03 LAB — PROTIME-INR
INR: 1.1
Prothrombin Time: 14.2 seconds (ref 11.4–15.2)

## 2017-10-03 LAB — GLUCOSE, CAPILLARY: Glucose-Capillary: 205 mg/dL — ABNORMAL HIGH (ref 65–99)

## 2017-10-03 NOTE — Progress Notes (Signed)
Spoke with anesthesia and made aware that patient is blind and hemiplegic in special chair.  Short Stay to given am meds per Dr Royce Macadamia ( anesthesia) Spoke with wife and made her aware and she voiced understanding that she will not give husband any of his am meds in blister pack.  Also reviewed with patient's wife  And she is aware to not give the last dose of Benadryl nor the last dose of Prednisone.  Wife voiced understanding.  Told wife I would call her back tomorrow and review again with her and would call daugher also.

## 2017-10-03 NOTE — Progress Notes (Signed)
Faxed to DR OTtelin via epic BMP done 10/03/17.

## 2017-10-03 NOTE — Progress Notes (Signed)
09/26/17-LOV-cardiology- Dr Russella Dar  02/21/17-ekg-epic  CXR-03/07/17-epic

## 2017-10-03 NOTE — Progress Notes (Signed)
BMp done 10/25/128 fAxed via epic to Dr Karsten Ro.

## 2017-10-03 NOTE — Progress Notes (Signed)
Requested periop device orders to be completed by fax on 10/02/17 along with last device tracing, last 12 lead ekg tracing, ov, echo and clearance for surgery from office of Dr Wyline Copas.  Fax confirmation received on 10/02/17.

## 2017-10-04 ENCOUNTER — Other Ambulatory Visit: Payer: Self-pay | Admitting: Radiology

## 2017-10-04 NOTE — Progress Notes (Signed)
Keith Rake, RN in chart room received phone call from [place] at [place] stating wife had called and stated she had not received preop instructions from preop visit but then found.  Nurse called [facility] and reviewed arrival time of 0730am to [place] after midnite.  And patient is to take no am meds morning of procedure on 10/07/2017.  Wife is to bring Diphenhydramine am dose with her to hospital in bottle and Prednisone am dose of procedure with her to hospital .  Wife is to give the 13 hours prior to procedure the dose of Prednisone and the 7 hours prior to procedure dose of Prednisone.    Sonya informed that I had spoken with wife on 10/03/17 pm regarding this and that I was going to call wife back today and review again with her and call daughter , Ida Rogue with this information.  Wife is aware of some medications but not sure of others so nurse decided after preop visit on 10/03/18 with patient and wife and after looking back into January 2018 preop visit that we had them to take no medications then due to medications being placed into blister pack by [facility] per wife.

## 2017-10-04 NOTE — Progress Notes (Signed)
Spoke with [facility]  with [facility] on 10/03/17 and reviewed when patient is to take am meds  Nurse informed PA that I would have wife bring in 1 hour prior to procedure doses of Prednisone and Diphenhydramine in the bottles with her to hospital since patient would be arriving 2 hours prior to procedure.  Kelly in agreement with this procedure above.

## 2017-10-04 NOTE — Progress Notes (Signed)
No periop devic order received with date as o [id number] on 10/04/18.  Orders with no date on chart  EKG arrived and placed on chart dated 09/26/17.

## 2017-10-04 NOTE — Progress Notes (Signed)
Called wife, Solace Wendorff at home and had wife look at the preop instructions for surgery on 10/07/17 which she was able to locate.  Instructed wife to place on refrigerator.  Reviewed with wife that patient is to asrrive at 39am at [facility] which she sees on preop sheet.  Patient is to go to Radiology first to check in then to Short Stay.  Reviewed with wife that patient is to take no medications am of surgery.  Wife is to bring final dose of Prednisone and Diphenhydramine doses to hospital in bottles.  Instructed wife to mark thru original preop instructions regarding medications which she stated she did.  Wife verbalized back to nurse x 2 that she is to give patient no am medications am of procedure on 10/07/17 and to bring last dose of Prednisone and the dose of Diphenhydramine with her to hospital in bottles.   Reviewed again the hibiclens shower the nite before which she verbalized back to me.  Called daughter , Ida Rogue at [phone] and spoke with her and told her that I had reviewed with mother on 10/03/17 pm by phone that patient is to have no meds am of procedure , that Short Stay nurses will give to him medications needed am of procedure and that wife is to bring Prednisone final  dose which patient is to receive one hour before ( per wife ) and Diphenhydramine am  dose in bottle to hospital.  Daughter verbalized understanding.

## 2017-10-04 NOTE — Progress Notes (Signed)
Requested on 10/02/17 by fax completed periop device orders along with LOV, EKG , echo , etc.  Fax confirmation received.   10/04/17 Refaxed request for periop device orders to be completed and sent along with LOV, EKG, echo and etc. Received periop device orders that were note dated.   Resent to Dr Wyline Copas office request for periop device orders to be dated.   Last Device check 07/08/17 on chart  09/26/17-LOV- Dr Wyline Copas on chart  09/26/17-Transthoracic Echo on chart

## 2017-10-07 ENCOUNTER — Ambulatory Visit (HOSPITAL_COMMUNITY)
Admission: RE | Admit: 2017-10-07 | Discharge: 2017-10-07 | Disposition: A | Discharge status: Home / Self Care | Payer: Medicare (Managed Care) | Source: Ambulatory Visit | Attending: Urology | Admitting: Urology

## 2017-10-07 ENCOUNTER — Ambulatory Visit (HOSPITAL_COMMUNITY): Payer: Medicare (Managed Care) | Admitting: Anesthesiology

## 2017-10-07 ENCOUNTER — Encounter (HOSPITAL_COMMUNITY)
Admission: RE | Disposition: A | Discharge status: Home / Self Care | Payer: Self-pay | Source: Ambulatory Visit | Attending: Urology

## 2017-10-07 ENCOUNTER — Ambulatory Visit (HOSPITAL_COMMUNITY)
Admission: RE | Admit: 2017-10-07 | Discharge: 2017-10-08 | Disposition: A | Discharge status: Home / Self Care | Payer: Medicare (Managed Care) | Source: Ambulatory Visit | Attending: Urology | Admitting: Urology

## 2017-10-07 ENCOUNTER — Ambulatory Visit (HOSPITAL_COMMUNITY): Payer: Medicare (Managed Care)

## 2017-10-07 ENCOUNTER — Encounter (HOSPITAL_COMMUNITY): Payer: Self-pay | Admitting: *Deleted

## 2017-10-07 DIAGNOSIS — F329 Major depressive disorder, single episode, unspecified: Secondary | ICD-10-CM | POA: Diagnosis not present

## 2017-10-07 DIAGNOSIS — I4891 Unspecified atrial fibrillation: Secondary | ICD-10-CM | POA: Insufficient documentation

## 2017-10-07 DIAGNOSIS — Z950 Presence of cardiac pacemaker: Secondary | ICD-10-CM | POA: Diagnosis not present

## 2017-10-07 DIAGNOSIS — Z87891 Personal history of nicotine dependence: Secondary | ICD-10-CM | POA: Insufficient documentation

## 2017-10-07 DIAGNOSIS — N200 Calculus of kidney: Secondary | ICD-10-CM

## 2017-10-07 DIAGNOSIS — Z880 Allergy status to penicillin: Secondary | ICD-10-CM | POA: Diagnosis not present

## 2017-10-07 DIAGNOSIS — Z8673 Personal history of transient ischemic attack (TIA), and cerebral infarction without residual deficits: Secondary | ICD-10-CM | POA: Diagnosis not present

## 2017-10-07 DIAGNOSIS — R310 Gross hematuria: Secondary | ICD-10-CM | POA: Diagnosis not present

## 2017-10-07 DIAGNOSIS — Z87442 Personal history of urinary calculi: Secondary | ICD-10-CM | POA: Diagnosis not present

## 2017-10-07 DIAGNOSIS — I10 Essential (primary) hypertension: Secondary | ICD-10-CM | POA: Insufficient documentation

## 2017-10-07 DIAGNOSIS — Q638 Other specified congenital malformations of kidney: Secondary | ICD-10-CM | POA: Diagnosis not present

## 2017-10-07 DIAGNOSIS — Z86010 Personal history of colonic polyps: Secondary | ICD-10-CM | POA: Diagnosis not present

## 2017-10-07 DIAGNOSIS — I2510 Atherosclerotic heart disease of native coronary artery without angina pectoris: Secondary | ICD-10-CM | POA: Diagnosis not present

## 2017-10-07 DIAGNOSIS — Z79899 Other long term (current) drug therapy: Secondary | ICD-10-CM | POA: Insufficient documentation

## 2017-10-07 DIAGNOSIS — G4730 Sleep apnea, unspecified: Secondary | ICD-10-CM | POA: Diagnosis not present

## 2017-10-07 DIAGNOSIS — F419 Anxiety disorder, unspecified: Secondary | ICD-10-CM | POA: Diagnosis not present

## 2017-10-07 DIAGNOSIS — E1140 Type 2 diabetes mellitus with diabetic neuropathy, unspecified: Secondary | ICD-10-CM | POA: Diagnosis not present

## 2017-10-07 DIAGNOSIS — Z7901 Long term (current) use of anticoagulants: Secondary | ICD-10-CM | POA: Diagnosis not present

## 2017-10-07 DIAGNOSIS — E785 Hyperlipidemia, unspecified: Secondary | ICD-10-CM | POA: Insufficient documentation

## 2017-10-07 DIAGNOSIS — K219 Gastro-esophageal reflux disease without esophagitis: Secondary | ICD-10-CM | POA: Insufficient documentation

## 2017-10-07 HISTORY — PX: NEPHROLITHOTOMY: SHX5134

## 2017-10-07 HISTORY — PX: IR URETERAL STENT RIGHT NEW ACCESS W/O SEP NEPHROSTOMY CATH: IMG6076

## 2017-10-07 LAB — APTT: APTT: 35 s (ref 24–36)

## 2017-10-07 LAB — GLUCOSE, CAPILLARY: Glucose-Capillary: 281 mg/dL — ABNORMAL HIGH (ref 65–99)

## 2017-10-07 SURGERY — NEPHROLITHOTOMY PERCUTANEOUS
Anesthesia: General | Laterality: Right

## 2017-10-07 MED ORDER — ACETAMINOPHEN 325 MG PO TABS
650.0000 mg | ORAL_TABLET | ORAL | Status: DC | PRN
  Administered 2017-10-08: 650 mg via ORAL
  Filled 2017-10-07: qty 2

## 2017-10-07 MED ORDER — MIDAZOLAM HCL 2 MG/2ML IJ SOLN
INTRAMUSCULAR | Status: AC
  Filled 2017-10-07: qty 4

## 2017-10-07 MED ORDER — ROCURONIUM BROMIDE 50 MG/5ML IV SOSY
PREFILLED_SYRINGE | INTRAVENOUS | Status: AC
  Filled 2017-10-07: qty 5

## 2017-10-07 MED ORDER — LIDOCAINE HCL (CARDIAC) 20 MG/ML IV SOLN
INTRAVENOUS | Status: DC | PRN
  Administered 2017-10-07: 60 mg via INTRAVENOUS

## 2017-10-07 MED ORDER — HYDROMORPHONE HCL 1 MG/ML IJ SOLN
0.5000 mg | INTRAMUSCULAR | Status: DC | PRN
  Administered 2017-10-07: 1 mg via INTRAVENOUS
  Filled 2017-10-07: qty 1

## 2017-10-07 MED ORDER — SUGAMMADEX SODIUM 200 MG/2ML IV SOLN
INTRAVENOUS | Status: DC | PRN
  Administered 2017-10-07: 200 mg via INTRAVENOUS

## 2017-10-07 MED ORDER — DIGOXIN 0.0625 MG HALF TABLET
0.0625 mg | ORAL_TABLET | ORAL | Status: AC
  Administered 2017-10-07: 0.0625 mg via ORAL
  Filled 2017-10-07: qty 1

## 2017-10-07 MED ORDER — FENTANYL CITRATE (PF) 100 MCG/2ML IJ SOLN
INTRAMUSCULAR | Status: AC | PRN
  Administered 2017-10-07 (×2): 50 ug via INTRAVENOUS

## 2017-10-07 MED ORDER — FENTANYL CITRATE (PF) 100 MCG/2ML IJ SOLN
INTRAMUSCULAR | Status: DC | PRN
  Administered 2017-10-07 (×2): 50 ug via INTRAVENOUS

## 2017-10-07 MED ORDER — FENTANYL CITRATE (PF) 100 MCG/2ML IJ SOLN
INTRAMUSCULAR | Status: AC
  Filled 2017-10-07: qty 2

## 2017-10-07 MED ORDER — FENTANYL CITRATE (PF) 100 MCG/2ML IJ SOLN: 25.0000 ug | INTRAMUSCULAR | Status: DC | PRN

## 2017-10-07 MED ORDER — ROCURONIUM BROMIDE 100 MG/10ML IV SOLN
INTRAVENOUS | Status: DC | PRN
  Administered 2017-10-07 (×3): 10 mg via INTRAVENOUS
  Administered 2017-10-07: 30 mg via INTRAVENOUS

## 2017-10-07 MED ORDER — LIDOCAINE 2% (20 MG/ML) 5 ML SYRINGE
INTRAMUSCULAR | Status: AC
  Filled 2017-10-07: qty 5

## 2017-10-07 MED ORDER — MEPERIDINE HCL 50 MG/ML IJ SOLN: 6.2500 mg | INTRAMUSCULAR | Status: DC | PRN

## 2017-10-07 MED ORDER — VANCOMYCIN HCL IN DEXTROSE 1-5 GM/200ML-% IV SOLN
1000.0000 mg | INTRAVENOUS | Status: AC
  Administered 2017-10-07: 1000 mg via INTRAVENOUS

## 2017-10-07 MED ORDER — CEFAZOLIN SODIUM-DEXTROSE 1-4 GM/50ML-% IV SOLN
1.0000 g | Freq: Three times a day (TID) | INTRAVENOUS | Status: AC
  Administered 2017-10-07 – 2017-10-08 (×2): 1 g via INTRAVENOUS
  Filled 2017-10-07 (×2): qty 50

## 2017-10-07 MED ORDER — IOPAMIDOL (ISOVUE-300) INJECTION 61%
50.0000 mL | Freq: Once | INTRAVENOUS | Status: AC | PRN
  Administered 2017-10-07: 20 mL

## 2017-10-07 MED ORDER — FENTANYL CITRATE (PF) 100 MCG/2ML IJ SOLN
INTRAMUSCULAR | Status: AC
  Filled 2017-10-07: qty 4

## 2017-10-07 MED ORDER — HYOSCYAMINE SULFATE 0.125 MG SL SUBL
0.1250 mg | SUBLINGUAL_TABLET | SUBLINGUAL | Status: DC | PRN
  Filled 2017-10-07: qty 1

## 2017-10-07 MED ORDER — SODIUM CHLORIDE 0.9 % IV SOLN
INTRAVENOUS | Status: DC
  Administered 2017-10-07: 08:00:00 via INTRAVENOUS

## 2017-10-07 MED ORDER — SODIUM CHLORIDE 0.9 % IV SOLN
INTRAVENOUS | Status: DC
  Administered 2017-10-07 – 2017-10-08 (×2): via INTRAVENOUS

## 2017-10-07 MED ORDER — LACTATED RINGERS IV SOLN
INTRAVENOUS | Status: DC | PRN
  Administered 2017-10-07 (×2): via INTRAVENOUS

## 2017-10-07 MED ORDER — PROMETHAZINE HCL 25 MG/ML IJ SOLN
6.2500 mg | INTRAMUSCULAR | Status: DC | PRN
Start: 2017-10-07 — End: 2017-10-07

## 2017-10-07 MED ORDER — PROPOFOL 10 MG/ML IV BOLUS
INTRAVENOUS | Status: AC
  Filled 2017-10-07: qty 20

## 2017-10-07 MED ORDER — CIPROFLOXACIN IN D5W 400 MG/200ML IV SOLN: 400.0000 mg | Freq: Once | INTRAVENOUS | Status: DC

## 2017-10-07 MED ORDER — LACTATED RINGERS IV SOLN: INTRAVENOUS | Status: DC

## 2017-10-07 MED ORDER — SUGAMMADEX SODIUM 200 MG/2ML IV SOLN
INTRAVENOUS | Status: AC
  Filled 2017-10-07: qty 2

## 2017-10-07 MED ORDER — MIDAZOLAM HCL 2 MG/2ML IJ SOLN
INTRAMUSCULAR | Status: AC
  Filled 2017-10-07: qty 2

## 2017-10-07 MED ORDER — OXYCODONE-ACETAMINOPHEN 5-325 MG PO TABS: 1.0000 | ORAL_TABLET | ORAL | Status: DC | PRN

## 2017-10-07 MED ORDER — MIDAZOLAM HCL 2 MG/2ML IJ SOLN
INTRAMUSCULAR | Status: AC | PRN
  Administered 2017-10-07 (×2): 1 mg via INTRAVENOUS

## 2017-10-07 MED ORDER — ONDANSETRON HCL 4 MG/2ML IJ SOLN: 4.0000 mg | INTRAMUSCULAR | Status: DC | PRN

## 2017-10-07 MED ORDER — VANCOMYCIN HCL IN DEXTROSE 1-5 GM/200ML-% IV SOLN
INTRAVENOUS | Status: AC
  Filled 2017-10-07: qty 200

## 2017-10-07 MED ORDER — ONDANSETRON HCL 4 MG/2ML IJ SOLN
INTRAMUSCULAR | Status: DC | PRN
  Administered 2017-10-07: 4 mg via INTRAVENOUS

## 2017-10-07 MED ORDER — SUCCINYLCHOLINE CHLORIDE 200 MG/10ML IV SOSY
PREFILLED_SYRINGE | INTRAVENOUS | Status: AC
  Filled 2017-10-07: qty 10

## 2017-10-07 MED ORDER — ONDANSETRON HCL 4 MG/2ML IJ SOLN
INTRAMUSCULAR | Status: AC
  Filled 2017-10-07: qty 2

## 2017-10-07 MED ORDER — IOPAMIDOL (ISOVUE-300) INJECTION 61%
INTRAVENOUS | Status: AC
  Administered 2017-10-07: 20 mL
  Filled 2017-10-07: qty 50

## 2017-10-07 MED ORDER — LIDOCAINE HCL 1 % IJ SOLN
INTRAMUSCULAR | Status: AC | PRN
  Administered 2017-10-07: 20 mL via INTRADERMAL

## 2017-10-07 MED ORDER — SODIUM CHLORIDE 0.9 % IR SOLN
Status: DC | PRN
  Administered 2017-10-07: 6000 mL
  Administered 2017-10-07: 9000 mL

## 2017-10-07 MED ORDER — LIDOCAINE HCL 1 % IJ SOLN
INTRAMUSCULAR | Status: AC
  Filled 2017-10-07: qty 20

## 2017-10-07 MED ORDER — PROPOFOL 10 MG/ML IV BOLUS
INTRAVENOUS | Status: DC | PRN
  Administered 2017-10-07: 150 mg via INTRAVENOUS

## 2017-10-07 MED ORDER — IOHEXOL 300 MG/ML  SOLN
INTRAMUSCULAR | Status: DC | PRN
  Administered 2017-10-07: 10 mL

## 2017-10-07 SURGICAL SUPPLY — 44 items
APPLICATOR SURGIFLO ENDO (HEMOSTASIS) ×2 IMPLANT
BAG URINE DRAINAGE (UROLOGICAL SUPPLIES) IMPLANT
BASKET ZERO TIP NITINOL 2.4FR (BASKET) ×2 IMPLANT
BENZOIN TINCTURE PRP APPL 2/3 (GAUZE/BANDAGES/DRESSINGS) ×2 IMPLANT
BLADE SURG 15 STRL LF DISP TIS (BLADE) ×1 IMPLANT
BLADE SURG 15 STRL SS (BLADE) ×1
CATH FOLEY 2W COUNCIL 20FR 5CC (CATHETERS) IMPLANT
CATH FOLEY 2WAY SLVR  5CC 18FR (CATHETERS) ×1
CATH FOLEY 2WAY SLVR 5CC 18FR (CATHETERS) ×1 IMPLANT
CATH IMAGER II 65CM (CATHETERS) IMPLANT
CATH X-FORCE N30 NEPHROSTOMY (TUBING) ×2 IMPLANT
COVER SURGICAL LIGHT HANDLE (MISCELLANEOUS) IMPLANT
DRAPE C-ARM 42X120 X-RAY (DRAPES) ×2 IMPLANT
DRAPE LINGEMAN PERC (DRAPES) ×2 IMPLANT
DRAPE SURG IRRIG POUCH 19X23 (DRAPES) ×2 IMPLANT
DRSG PAD ABDOMINAL 8X10 ST (GAUZE/BANDAGES/DRESSINGS) IMPLANT
DRSG TEGADERM 4X4.75 (GAUZE/BANDAGES/DRESSINGS) ×2 IMPLANT
DRSG TEGADERM 8X12 (GAUZE/BANDAGES/DRESSINGS) IMPLANT
FIBER LASER TRAC TIP (UROLOGICAL SUPPLIES) IMPLANT
FLOSEAL 10ML (HEMOSTASIS) ×4 IMPLANT
GAUZE SPONGE 4X4 12PLY STRL (GAUZE/BANDAGES/DRESSINGS) ×2 IMPLANT
GLOVE BIOGEL M 8.0 STRL (GLOVE) ×2 IMPLANT
GOWN STRL REUS W/TWL XL LVL3 (GOWN DISPOSABLE) ×2 IMPLANT
GUIDEWIRE STR DUAL SENSOR (WIRE) IMPLANT
HOLDER FOLEY CATH W/STRAP (MISCELLANEOUS) ×2 IMPLANT
KIT BASIN OR (CUSTOM PROCEDURE TRAY) ×2 IMPLANT
MANIFOLD NEPTUNE II (INSTRUMENTS) ×2 IMPLANT
NS IRRIG 1000ML POUR BTL (IV SOLUTION) IMPLANT
PACK CYSTO (CUSTOM PROCEDURE TRAY) ×2 IMPLANT
PROBE LITHOCLAST ULTRA 3.8X403 (UROLOGICAL SUPPLIES) ×2 IMPLANT
PROBE PNEUMATIC 1.0MMX570MM (UROLOGICAL SUPPLIES) ×2 IMPLANT
SET IRRIG Y TYPE TUR BLADDER L (SET/KITS/TRAYS/PACK) ×2 IMPLANT
SHEATH PEELAWAY SET 9 (SHEATH) ×2 IMPLANT
STENT URET 6FRX24 CONTOUR (STENTS) ×2 IMPLANT
STONE CATCHER W/TUBE ADAPTER (UROLOGICAL SUPPLIES) ×2 IMPLANT
SURGIFLO W/THROMBIN 8M KIT (HEMOSTASIS) IMPLANT
SUT MNCRL AB 4-0 PS2 18 (SUTURE) ×2 IMPLANT
SUT SILK 2 0 30  PSL (SUTURE)
SUT SILK 2 0 30 PSL (SUTURE) IMPLANT
SYR 10ML LL (SYRINGE) ×2 IMPLANT
SYR 20CC LL (SYRINGE) ×4 IMPLANT
TOWEL OR NON WOVEN STRL DISP B (DISPOSABLE) ×2 IMPLANT
TRAY FOLEY W/METER SILVER 16FR (SET/KITS/TRAYS/PACK) ×2 IMPLANT
TUBING CONNECTING 10 (TUBING) ×4 IMPLANT

## 2017-10-07 NOTE — H&P (Signed)
Chief Complaint: Patient was seen in consultation today for (R)neprhostomy tube for PCNL access at the request of Dr. Karsten Ro  Referring Physician(s): Dr. Kathie Rhodes  Supervising Physician: Sandi Mariscal  Patient Status: [facility] - Out-pt  History of Present Illness: Steven Duffy is a 76 y.o. male with symptomatic large right renal calculus. He is scheduled for PCNL today with Dr. Karsten Ro and comes to IR for placement of percutaneous nephrostomy access. PMHx, meds, labs, imaging, allergies reviewed. Pt does have known contrast/iodine allergy and was given pre-med Prednisone/Benadryl protocol. Has been NPO Family at bedside Pt blind.  Past Medical History:  Diagnosis Date  . Alcohol dependence in remission ([place])   . Allergic rhinitis   . Anemia   . Anxiety    chronic  . Arthritis    osteoarthritis   . Atrial fibrillation ([place])   . Blind   . BPH (benign prostatic hypertrophy) with urinary obstruction   . Brachial neuritis or radiculitis   . Carotid bruit   . Colon polyps    hx of   . Coronary artery disease    non obstructive per cath 2005   . Depression   . Dermatitis   . Diabetes mellitus without complication ([place])    type II  . GERD (gastroesophageal reflux disease)   . Glaucoma   . Headache    hx of migraines   . Hematuria    hx of   . Hemorrhoids   . History of kidney stones   . Hyperlipidemia   . Hypertension   . Kidney stones    stage 3  . Lumbago   . Lumbar radiculopathy   . Neuropathy   . Neuropathy due to medical condition ([place])   . 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 Occasional tremors    right arm   . Pharyngitis    hx of   . Pneumonia    hx of legionnaires in 2003  . Presence of permanent cardiac pacemaker    [employer]   . Prostatitis   . Rotator cuff tear   . Sinus bradycardia    required pacer   . Sleep apnea    bipap  . Stroke [facility])    left side weakness   . Ventricular tachycardia (H[facility])    hx of   . Vitreous  hemorrhage (H[facility])     Past Surgical History:  Procedure Laterality Date  . cataract surgery      left   . CIRCUMCISION    . COLONOSCOPY    . CYSTOSCOPY     multiple   . CYSTOSCOPY WITH RETROGRADE PYELOGRAM, URETEROSCOPY AND STENT PLACEMENT Right 07/04/2015   Procedure: RIGHT RETROGRADE PYELOGRAM,  RIGHT URETEROSCOPY ;  Surgeon: Kathie Rhodes, MD;  Location: [facility] ORS;  Service: Urology;  Laterality: Right;  . CYSTOSCOPY/RETROGRADE/URETEROSCOPY/STONE EXTRACTION WITH BASKET Right 01/04/2017   Procedure: CYSTOSCOPY/RETROGRADE/URETEROSCOPY/HOLMIUM LASER/STONE EXTRACTION WITH BASKET/ DOUBLE J STENT;  Surgeon: Kathie Rhodes, MD;  Location: [facility];  Service: Urology;  Laterality: Right;  . hemilaminectomy    . HOLMIUM LASER APPLICATION Right 4/62/7035   Procedure: HOLMIUM LASER LITHOTRIPSY AND STENT PLACEMENT ;  Surgeon: Kathie Rhodes, MD;  Location: [facility];  Service: Urology;  Laterality: Right;  . INTRAOCULAR LENS INSERTION    . neck surgery in 1995 (neck broken)    . placement of gastrostomy tube      hx of   . PP vitrectomy     . rhinologic surgery     . SHOULDER SURGERY    .  TRACHEOSTOMY     hx of   . URETHROPLASTY    . VASECTOMY      Allergies: Duloxetine; Iodine; Lyrica [pregabalin]; Shellfish-derived products; Procaine; Ciprofloxacin; Penicillins; and Tape  Medications: Prior to Admission medications   Medication Sig Start Date End Date Taking? Authorizing Provider  acetaminophen (TYLENOL) 500 MG tablet Take 500-1,000 mg by mouth every 4 (four) hours as needed for mild pain.    Yes [provider]  acetaZOLAMIDE (DIAMOX) 250 MG tablet Take 250 mg by mouth 3 (three) times daily.   Yes [provider]  atorvastatin (LIPITOR) 20 MG tablet Take 20 mg by mouth at bedtime.    Yes [provider]  bimatoprost (LUMIGAN) 0.01 % SOLN Place 1 drop into the right eye at bedtime as needed.  10/19/14  Yes [provider]  buPROPion (WELLBUTRIN XL) 150 MG 24 hr  tablet Take 150 mg by mouth every morning.    Yes [provider]  cholecalciferol (VITAMIN D) 1000 units tablet Take 1,000 Units by mouth every morning.    Yes [provider]  dabigatran (PRADAXA) 150 MG CAPS capsule Take 150 mg by mouth 2 (two) times daily.   Yes [provider]  digoxin (LANOXIN) 0.125 MG tablet Take 0.0625 mg by mouth every morning.    Yes [provider]  diltiazem (DILACOR XR) 180 MG 24 hr capsule Take 180 mg by mouth every morning.    Yes [provider]  diphenhydrAMINE (SOMINEX) 25 MG tablet Take 25 mg by mouth once. Take 2 capsules by mouth 1 hour prior to surgery 10/07/17 10/07/17 Yes [provider]  docusate sodium (COLACE) 100 MG capsule Take 100 mg by mouth daily as needed for mild constipation.    Yes [provider]  dorzolamidel-timolol (COSOPT) 22.3-6.8 MG/ML SOLN ophthalmic solution Place 1 drop into the right eye 2 (two) times daily. For glaucoma   Yes [provider]  escitalopram (LEXAPRO) 20 MG tablet Take 20 mg by mouth every morning.   Yes [provider]  finasteride (PROSCAR) 5 MG tablet Take 5 mg by mouth every morning.    Yes [provider]  gabapentin (NEURONTIN) 300 MG capsule Take 300 mg by mouth 2 (two) times daily.    Yes [provider]  magnesium oxide (MAG-OX) 400 MG tablet Take 400 mg by mouth every morning.    Yes [provider]  metoprolol tartrate (LOPRESSOR) 25 MG tablet Take 12.5 mg by mouth 2 (two) times daily.   Yes [provider]  pilocarpine (PILOCAR) 1 % ophthalmic solution Place 2 drops into the right eye 4 (four) times daily.  10/19/14  Yes [provider]  Polyethylene Glycol 3350 (MIRALAX PO) Take 17 g by mouth daily as needed (constipation).   Yes [provider]  predniSONE (DELTASONE) 50 MG tablet Take 50 mg by mouth once. Pt to take 1 tab 13 hours, 7 hours and 1 hour before right percutaneous  nephrostolithotomy as directed   Yes [provider]  ranitidine (ZANTAC) 150 MG tablet Take 150 mg by mouth at bedtime.   Yes [provider]  Skin Protectants, Misc. (PERIGUARD) OINT Apply 1 application topically daily as needed. TO AFFECTED SITE (FOR RASH)   Yes [provider]  tamsulosin (FLOMAX) 0.4 MG CAPS capsule Take 0.4 mg by mouth every evening. FOR URINARY SYMPTOMS   Yes [provider]     History reviewed. No pertinent family history.  Social History  Social History  . Marital status: Married    Spouse name: N/A  . Number of children: N/A  . Years of education: N/A   Social History Main Topics  . Smoking status: Former Smoker    Quit date: 12/29/1980  . Smokeless tobacco: Never Used  . Alcohol use No  . Drug use: No  . Sexual activity: Not Asked   Other Topics Concern  . None   Social History Narrative  . None     Review of Systems: A 12 point ROS discussed and pertinent positives are indicated in the HPI above.  All other systems are negative.  Review of Systems  Vital Signs: BP (!) 143/76 (BP Location: Right Arm)   Pulse 70   Temp 97.7 F (36.5 C) (Oral)   Resp 18   SpO2 99%   Physical Exam  Constitutional: He appears well-developed. No distress.  HENT:  Head: Normocephalic.  Mouth/Throat: Oropharynx is clear and moist.  Cardiovascular: Normal rate, regular rhythm and normal heart sounds.   Pulmonary/Chest: Effort normal and breath sounds normal. No respiratory distress.  Abdominal: Soft. There is no tenderness.  Neurological: He is alert.  Skin: Skin is warm and dry.  Psychiatric: He has a normal mood and affect.    Imaging: No results found.  Labs:  CBC:  Recent Labs  02/22/17 0717 02/23/17 0603 02/24/17 0621 10/03/17 1201  WBC 9.3 8.9 7.1 7.6  HGB 10.1* 10.4* 10.2* 12.8*  HCT 32.0* 33.4* 32.4* 39.4  PLT 157 177 187 183    COAGS:  Recent Labs  01/04/17 0548 10/03/17 1201  INR  --   1.10  APTT 38*  --     BMP:  Recent Labs  02/22/17 0717 02/23/17 0603 02/24/17 0621 10/03/17 1201  NA 138 138 140 141  K 3.6 3.5 3.4* 4.2  CL 111 113* 119* 114*  CO2 17* 15* 15* 22  GLUCOSE 121* 127* 106* 213*  BUN 22* 23* 25* 27*  CALCIUM 8.4* 8.2* 8.3* 8.9  CREATININE 1.79* 1.68* 1.76* 1.58*  GFRNONAA 35* 38* 36* 41*  GFRAA 41* 44* 42* 47*    LIVER FUNCTION TESTS:  Recent Labs  02/21/17 1428 02/21/17 1811  BILITOT 0.5 QUANTITY NOT SUFFICIENT, UNABLE TO PERFORM TEST  AST 14* 17  ALT 11* 10*  ALKPHOS 78 79  PROT 5.9* 5.9*  ALBUMIN 2.8* 2.8*    TUMOR MARKERS: No results for input(s): AFPTM, CEA, CA199, CHROMGRNA in the last 8760 hours.  Assessment and Plan: Right renal stone For PCN placement in IR to be followed by PCNL in OR by Urology Labs ok Risks and benefits of right perc nephrostomy were discussed with the patient including, but not limited to, infection, bleeding, significant bleeding causing loss or decrease in renal function or damage to adjacent structures.   All of the patient's questions were answered, patient is agreeable to proceed.  Consent signed and in chart.    Thank you for this interesting consult.  I greatly enjoyed meeting Steven Duffy and look forward to participating in their care.  A copy of this report was sent to the requesting provider on this date.  Electronically Signed: Ascencion Dike, PA-C 10/07/2017, 8:43 AM   I spent a total of 20 minutes in face to face in clinical consultation, greater than 50% of which was counseling/coordinating care for right PCN

## 2017-10-07 NOTE — Progress Notes (Signed)
50mg  benadryl and predisone 50mg , last dose taken at this time as instructed by IR.

## 2017-10-07 NOTE — H&P (Signed)
HPI: Steven Duffy is a 76 year-old male with right renal calculi and gross hematuria.  He did see the blood in his urine. He first noticed the symptoms approximately 06/09/2017. He has seen blood clots.   He does have a burning sensation when he urinates. He is currently having trouble urinating.   He is having pain. He has not recently had unwanted weight loss.   08/19/17: His gross hematuria has persisted. He is on for the accident but has stopped this in the past for various procedures over the years.     CC: I have kidney stones.  HPI: He first stated noticing pain on approximately 06/09/2017. This is not his first kidney stone. He has had more than 5 stones prior to getting this one. He is currently having flank pain. He denies having back pain, groin pain, nausea, vomiting, fever, and chills. He has caught a stone in his urine strainer since his symptoms began.   He has had ureteral stent and ureteroscopy for treatment of his stones in the past.   08/16/17: Passed 4 stones yesterday which looked like "sand". Having back pain which PRN Tylenol is controlling.   08/19/17: He has continued to have intermittent pain in his right flank. It set times fairly significant.     ALLERGIES: Adhesive tape - Other Reaction, blisters Ciprofloxacin - Other Reaction, blisters duloxetine - Other Reaction, suicidal Iodine SOLN - Trouble Breathing Penicillins - Trouble Breathing procaine - Other Reaction, UNK Shellfish - Trouble Breathing, Swelling    MEDICATIONS: Metformin Hcl  Phenazopyridine Hcl  Acetaminophen 1 PO PRN  Acetazolamide 250 mg tablet 1 tablet PO TID  Atorvastatin Calcium 20 mg tablet  Bupropion Hcl Sr 150 mg tablet, extended release 12 hr  Calcium Citrate 950 MG Oral Tablet 2 Oral Every twelve hours  Digoxin 125 mcg tablet 1/2 tablet PO Daily  Diltiazem 24Hr Er 180 mg capsule, ext release 24 hr  Dorzolamide-Timolol 22.3 mg-6.8 mg/ml drops  Dulcoease 100 mg capsule 3 capsule PO  Daily  Escitalopram Oxalate  Ferrous Sulfate 325 mg (65 mg iron) tablet  Finasteride 5 mg tablet Oral  Gabapentin 300 mg tablet Oral  Humalog  Lexapro 20 mg tablet Oral  Lumigan 0.01 % drops Ophthalmic  Magnesium Oxide 400 mg tablet  Metoprolol Tartrate 25 mg tablet 1 tablet PO BID  Oxycodone Hcl  Pantoprazole Sodium 40 mg tablet, delayed release  Periguard ointment  Pilocar 1 % drops Ophthalmic  Pilocarpine Hcl  Pradaxa 150 mg capsule Oral  Ranitidine Hcl 300 mg tablet 1 tablet PO Q HS  Senna-S 8.6 mg-50 mg tablet 1 tablet PO BID PRN  Tamsulosin Hcl 0.4 mg capsule, ext release 24 hr Oral  Tylenol Extra Strength 500 mg tablet 1 tablet PO Q 4 H PRN  Vitamin D3 1,000 unit tablet  Wellbutrin Sr 150 mg tablet, extended release 12 hr Oral     GU PSH: Catheterization For Collection Of Specimen, Single Patient, All Places Of Service - 06/06/2016 Cysto Remove Stent FB Sim - 02/12/2017 Cysto Uretero Lithotripsy, Right - 01/04/2017, 2016 Cystoscopy - 07/05/2016 Cystoscopy Insert Stent - 2016 Ureteroscopic laser litho, Right - 01/04/2017    NON-GU PSH: Brain Surgery (Unspecified) Eye Surgery (Unspecified) Heart Pacemaker, Insert Neck Surgery - 1967 Shoulder Arthroscopy/surgery, Bilateral - 2008 Shoulder Surgery (Unspecified)    GU PMH: Gross hematuria (Worsening, Chronic), Most likely related to large multiple stones right renal pelvis. - 08/16/2017, (Worsening), I'm going to check an H&H today as well as a creatinine.  I will then see if his antiplatelet therapy can be stopped and then will have him back For reassessment in a week with a CT scan at that time., - 10/18/2016 (Stable), His gross hematuria appears to be from his calculi. 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 Now that he is again  symptomatic with hematuria and back pain will have pt f/u w/Dr. Karsten Ro to discuss possible treatment of large stone. Offered pt pain medication which he refused at this time. - 08/16/2017, (Improving), Bilateral, He still has bilateral renal calculi but at this point I am not going to attempt any further stone removal as long as he remains asymptomatic and free of hematuria., - 02/12/2017, Bilateral, His serum studies revealed a low PTH but normal serum calcium. His 24-hour urine study showed a volume that was slightly low at 1.81 with slight hypercalciuria but significant hypocitraturia. My recommendation based on these findings is for him to increase his 24-hour fluid intake and add potassium citrate. He has been taking calcium citrate but it appears he needs more citrate to help prevent stone formation., - 08/09/2016 (Stable), He has bilateral renal calculi. The stones on the right-hand side appeared to have increased in size so I am going to evaluate him further with serum studies and a 24-hour urine and then we'll have him return to go over those results., - 07/05/2016, - 06/06/2016, - 05/14/2016, Renal calculus, bilateral, - 2017 Urinary Frequency (Worsening), He had been having urinary frequency and we discussed the fact that the stents can cause irritation to the bladder and result in this. I told him that I thought this would resolve with his stents having been removed today. - 02/12/2017 Renal and ureteral calculus - 11/06/2016 Retractile testis, Right, He was concerned about a tender area in the groin on the right hand side about half way between the top of his scrotum and the external inguinal ring. This was found to be his testicle. 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  Serum studies - no significant abnormality  24-hour urine - significant hypocitraturia with a secondary risk factor of a low total volume of 1.23 L.  Treatment: calcium citrate 20 mEq b.i.d., magnesium supplementation (OTC) and increase his fluid intake.   Bilateral renal cysts. These were noted on CT scan in 1/16.   Complete right ureteral duplication. This was noted on his CT scan and confirmed cystoscopically by the finding of 2 right ureteral orifices.   Gross hematuria: He experienced gross hematuria in 2/16. CT scan with and without contrast revealed no abnormality of the urothelium. Cystoscopic evaluation revealed no abnormality of the bladder other than BPH.   BPH: He was noted to have an enlarged prostate by cystoscopy.      NON-GU PMH: Encounter for general adult medical examination without abnormal  findings, Encounter for preventive health examination - 2016 Anxiety, Anxiety - 2016 Hypercholesterolemia, High cholesterol - 2016 Myocardial Infarction, History of acute myocardial infarction - 2016 Personal history of other diseases of the circulatory system, History of atrial fibrillation - 2016, History of hypertension, - 2016 Personal history of other diseases of the digestive system, History of esophageal reflux - 2016 Personal history of other diseases of the musculoskeletal system and connective tissue, History of arthritis - 2016, History of gout, - 2016 Personal history of other diseases of the nervous system and sense organs, History of glaucoma - 2016, History of sleep apnea, - 2016 Personal history of other endocrine, nutritional and metabolic disease, History of diabetes mellitus  - 2016 Personal history of other mental and behavioral disorders, History of depression - 2016 Personal history of transient ischemic attack (TIA), and cerebral infarction without residual deficits, History of stroke - 2016 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  Does not use smokeless tobacco. Has never drank.  Does not use drugs. Drinks 1 caffeinated drink per day.    REVIEW OF SYSTEMS:    GU Review Male:   Patient denies frequent urination, hard to postpone urination, burning/ pain with urination, get up at night to urinate, leakage of urine, stream starts and stops, trouble starting your stream, have to strain to urinate , erection problems, and penile pain.  Gastrointestinal (Upper):   Patient denies nausea, vomiting, and indigestion/ heartburn.  Gastrointestinal (Lower):   Patient denies diarrhea and constipation.  Constitutional:   Patient denies fever, night sweats, weight loss, and fatigue.  Skin:   Patient denies skin rash/ lesion and itching.  Eyes:   Patient denies blurred vision and double vision.  Ears/ Nose/ Throat:   Patient denies sore throat and sinus problems.  Hematologic/Lymphatic:   Patient denies swollen glands and easy bruising.  Cardiovascular:   Patient denies leg swelling and chest pains.  Respiratory:   Patient denies cough and shortness of breath.  Endocrine:   Patient denies excessive thirst.  Musculoskeletal:   Patient denies back pain and joint pain.  Neurological:   Patient denies dizziness and headaches.  Psychologic:   Patient denies depression and anxiety.   VITAL SIGNS:    Weight 158 lb / 71.67 kg  Height 67  in / 170.18 cm  BP 106/66 mmHg  Pulse 69 /min  Temperature 96.8 F / 36 C  BMI 24.7 kg/m   Constitutional: Mild physical deformities. Well-nourished. Normally developed ENMT: Mucous membranes are moist. Posterior pharynx clear of any exudate or lesions.Normal dentition.  Neck: normal, supple, no masses, no thyromegaly Respiratory: clear to auscultation bilaterally, no wheezing, no crackles. Normal respiratory effort. No accessory muscle use.  Cardiovascular: Regular rate and rhythm, no murmurs / rubs / gallops. 2+ pedal pulses. No carotid bruits.  Abdomen: no tenderness, no masses palpated. No hepatosplenomegaly. Bowel sounds positive.  Musculoskeletal: no clubbing / cyanosis. No joint deformity upper and lower extremities. Good ROM, no contractures. Normal muscle tone.  Skin: no rashes, lesions, ulcers. No induration Neurologic: left side hemiplegia, strength good on right side Psychiatric: Normal judgment and insight. Alert and oriented x 3. Normal mood.   PAST DATA REVIEWED:  Source Of History:  Patient  Records Review:   Previous Patient Records, POC Tool  X-Ray Review: [facility] Protocol: Reviewed Films. Discussed With Patient.     PROCEDURES: None   ASSESSMENT/PLAN:        ICD-10 Details  1 GU:   Renal calculus - N20.0 Worsening - I discussed with the patient the fact that he has significant stone burden in his right kidney. The large volume of stone would be best managed with PCNL however he has a duplicated system therefore I would not have access to all of his collecting system through one nephrostomy access to the kidney. This would either necessitate 2 nephrostomy tubes access to the middle pole and clearance of middle poles as much stone as possible with consideration of follow-up ureteroscopy and management of the upper pole calculi as necessary. His 24-hour urine showed that he had hypocitraturia and he is taking calcium citrate but continues to form stones. He is having  pain in his flank. He's had progression of his stone disease despite medical management and is continuing to have intermittent gross hematuria and therefore has elected to proceed with surgical management.  2   Gross hematuria - R31.0 Stable - It appeared grossly hematuria was secondary to the passage of his recent stones and from the stone burden currently in his right kidney.    What we have decided to do is proceed with a right percutaneous nephrostolithotomy addressing the stones in the middle pole calyx and upper pole of his partially duplicated system. This is where the bulk of his stone resides and if I can get this clear and potentially prevent further stone formation by placing him on a thiazide diuretic he may not need to have the stones in his lower pole moiety addressed.

## 2017-10-07 NOTE — Progress Notes (Signed)
Spoke with Dr Hollis/anesthesia in regards to pt usually taking digoxin and metoprolol in am but did not take either this am; okay to given digoxin dose this am but can hold metoprolol anesthesia will adminster if needed.

## 2017-10-07 NOTE — Anesthesia Postprocedure Evaluation (Signed)
Anesthesia Post Note  Patient: Steven Duffy  Procedure(s) Performed: NEPHROLITHOTOMY PERCUTANEOUS (Right )     Patient location during evaluation: PACU Anesthesia Type: General Level of consciousness: awake and alert Pain management: pain level controlled Vital Signs Assessment: post-procedure vital signs reviewed and stable Respiratory status: spontaneous breathing, nonlabored ventilation, respiratory function stable and patient connected to nasal cannula oxygen Cardiovascular status: blood pressure returned to baseline and stable Postop Assessment: no apparent nausea or vomiting Anesthetic complications: no    Last Vitals:  Vitals:   10/07/17 1515 10/07/17 1531  BP: (!) 144/85 (!) 154/81  Pulse: 70   Resp: 12 12  Temp:  (!) 36.3 C  SpO2: 100% 99%    Last Pain:  Vitals:   10/07/17 1330  TempSrc:   PainSc: [name]

## 2017-10-07 NOTE — Op Note (Signed)
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  POST-OPERATIVE DIAGNOSIS: Same  PROCEDURE: 1. Percutaneous nephrostomy sheath placement x2 2.  Right/left percutaneous nephrolithotomy (3 cm.) x2 3. Antegrade double-J stent placement x2 4.  Fluoroscopy time 53 minutes  SURGEON:  Claybon Jabs  INDICATION: JERMAR COLTER is a 76 year old male with a history of calculus disease.  He underwent ureteroscopy and laser lithotripsy of multiple stones but failed to pass these and they have increased in size resulting in pain and intermittent gross hematuria due to the his need to be on anticoagulation.  We therefore discussed the options for management and he has elected to proceed with a percutaneous nephrolithotomy.  He has a completely bifid system and therefore I asked the interventional radiologist to place nephrostomy tubes in the upper pole and lower pole moieties in order to access all of the stones.  This was accomplished.  ANESTHESIA:  General  EBL:  Minimal  DRAINS: 6 French, 24 cm double-J stent in the upper and lower pole moieties.  LOCAL MEDICATIONS USED:  None   Description of procedure: After informed consent the patient was taken to the operating room and administered general endotracheal anesthesia. Once fully anesthetized the patient had an 25 French Foley catheter placed It was then moved from the stretcher onto the operating room table in a prone position with bony prominences padded and chest pads in place. The flank with exiting nephrostomy catheter was then sterilely prepped and draped in standard fashion. An official timeout was then performed.  I elected to treat the stones in the lower pole moiety first.  Using the existing nephrostomy catheter access I passed a 0.038 inch floppy tipped guidewire down the ureter into the bladder under fluoroscopy.  This was left in place and the nephrostomy  catheter was removed.  A transverse incision was made over the guidewire and a peel-away coaxial catheter was then passed over the guidewire and down the ureter under fluoroscopy.  The inner portion of the coaxial system was then removed and a second guidewire was passed through this catheter and down the ureter into the bladder under fluoroscopy.  The coaxial catheter was then removed and one of the guidewires was secured to the drape as a safety guidewire and the second guidewire was used as a working guidewire.  The NephroMax nephrostomy dilating balloon was then passed over the working guidewire into the area of the renal pelvis under fluoroscopy.  It was then inflated using dilute contrast under fluoroscopy until the balloon was fully inflated.  I then passed the 28 French nephrostomy access sheath over the balloon into the area of the renal pelvis under fluoroscopy and then deflated the balloon and removed the dilating balloon.  The 83 French rigid nephroscope was then passed under direct vision through the nephrostomy access sheath.  Clotted blood was evacuated and the stone was identified.  I first tried to grasp the stone but it was fairly soft and tended to break up as I was trying to grasp it so I switched to the Swiss lithoclast using the ultrasound component and suction and was able to use the suction to remove a large portion of very small fragments.  The larger fragments were treated with ultrasound and I was able to remove the majority of the stone however there did appear to be some stone in the area of the UPJ that I could not access with the rigid  scope so I switched to the flexible cystoscope.  I then passed a 0 tip nitinol basket through the scope and was able to engage several stones and remove them as well as move other stones into the area of the renal pelvis.  No further stones were seen in the proximal ureter with the flexible cystoscope being passed as far down the ureter as possible.   I therefore switched back to the rigid scope and used the ultrasound in order to fragment and suction all remaining stone fragments.  Fluoroscopy revealed no obvious stone fragments remaining in the lower pole moiety.  The I then backloaded the guidewire on the rigid scope and passed the double-J stent down the ureter under fluoroscopy.  As I removed the guidewire and noted good curl in the bladder and there was good curl in the area of the renal pelvis.  I used 2 prong graspers to position the stent properly within the renal pelvis.  I then measured the distance from the level of the renal parenchyma to the end of the access sheath and used a laparoscopic introducer to inject FloSeal beginning at the depth of the renal parenchyma and filling the nephrostomy tract as I removed the access sheath simultaneously.  There was no bleeding noted and there was no significant bleeding that occurred throughout the procedure.  I then turned my attention to the upper pole and gained access with 2 guidewires and the access sheath in a fashion identical to as described above.  I was able to again remove the majority of the stone from the upper pole calyx with the ultrasound and then switched to the flexible scope and used the basket to basket stones from the upper portion of the ureter again no stones being noted in the ureter when I was done and no stones were identified within the upper pole moiety either by visual inspection or with fluoroscopy.  I then placed a stent in an identical fashion as described above and again, because there was minimal bleeding, elected to place FloSeal as described above and then I closed both of the nephrostomy tube incisions with running, 4-0 subcuticular Monocryl.  Sterile 4 x 4's were used to cover each of the incisions and then these were covered with Tegaderm.  The patient was awakened and taken to the recovery room in stable and satisfactory condition.  He tolerated the procedure well  with no intraoperative complications.    PLAN OF CARE: Discharge to home after an overnight stay.  PATIENT DISPOSITION:  PACU - hemodynamically stable.

## 2017-10-07 NOTE — Anesthesia Procedure Notes (Signed)
Procedure Name: Intubation Date/Time: 10/07/2017 11:40 AM Performed by: Gaston Islam EVETTE Pre-anesthesia Checklist: Patient identified and Suction available Patient Re-evaluated:Patient Re-evaluated prior to induction Oxygen Delivery Method: Circle system utilized Preoxygenation: Pre-oxygenation with 100% oxygen Induction Type: IV induction Ventilation: Mask ventilation without difficulty Laryngoscope Size: 4 Grade View: Grade III Number of attempts: 2 Airway Equipment and Method: Patient positioned with wedge pillow Placement Confirmation: ETT inserted through vocal cords under direct vision,  positive ETCO2,  CO2 detector and breath sounds checked- equal and bilateral Secured at: 22 cm Tube secured with: Tape Dental Injury: Teeth and Oropharynx as per pre-operative assessment  Comments: Limited ROM to neck, Grade 3 view only visualized epiglottis, Dr. Smith Robert intubated pt without any injuries to teeth and oropharynx, but noted that pt was a challenging intubation.

## 2017-10-07 NOTE — Transfer of Care (Signed)
Immediate Anesthesia Transfer of Care Note  Patient: Steven Duffy  Procedure(s) Performed: NEPHROLITHOTOMY PERCUTANEOUS (Right )  Patient Location: PACU  Anesthesia Type:General  Level of Consciousness: awake, alert  and oriented  Airway & Oxygen Therapy: Patient connected to face mask oxygen  Post-op Assessment: Report given to RN and Post -op Vital signs reviewed and stable  Post vital signs: Reviewed and stable  Last Vitals:  Vitals:   10/07/17 0759  BP: (!) 143/76  Pulse: 70  Resp: 18  Temp: 36.5 C  SpO2: 99%    Last Pain:  Vitals:   10/07/17 0759  TempSrc: Oral         Complications: No apparent anesthesia complications

## 2017-10-07 NOTE — Discharge Instructions (Signed)
Post percutaneous nephrolithotomy and stent  placement instructions   Definitions:  Ureter: The duct that transports urine from the kidney to the bladder. Stent: A plastic hollow tube that is placed into the ureter, from the kidney to the bladder to prevent the ureter from swelling shut.  General instructions:  Despite the fact that only a small skin incision was used, the area around the kidney, ureter and bladder is raw and irritated. The stent is a foreign body which can further irritate the bladder wall. This irritation is manifested by increased frequency of urination, both day and night, and by an increase in the urge to urinate. In some, the urge to urinate is present almost always. Sometimes the urge is strong enough that you may not be able to stop your self from urinating. This can often be controlled with medication but does not occur in everyone. A stent can safely be left in place for 3 months or greater.  You may see some blood in your urine while the stent is in place and a few days afterward. Do not be alarmed, even if the urine is clear for a while. Get off your feet and drink lots of fluids until clearing occurs. If you start to pass clots or don't improve, call us.  Diet:  You may return to your normal diet immediately. Because of the raw surface of your bladder, alcohol, spicy foods, foods high in acid and drinks with caffeine may cause irritation or frequency and should be used in moderation. To keep your urine flowing freely and avoid constipation, drink plenty of fluids during the day (8-10 glasses). Tip: Avoid cranberry juice because it is very acidic.  Activity:  Your physical activity doesn't need to be restricted. However, if you are very active, you may see some blood in the urine. We suggest that you reduce your activity under the circumstances until the bleeding has stopped.  Bowels:  It is important to keep your bowels regular during the postoperative period.  Straining with bowel movements can cause bleeding. A bowel movement every other day is reasonable. Use a mild laxative if needed, such as milk of magnesia 2-3 tablespoons, or 2 Dulcolax tablets. Call if you continue to have problems. If you had been taking narcotics for pain, before, during or after your surgery, you may be constipated. Take a laxative if necessary.  Medication:  You should resume your pre-surgery medications unless told not to. In addition you may be given an antibiotic to prevent or treat infection. Antibiotics are not always necessary. All medication should be taken as prescribed until the bottles are finished unless you are having an unusual reaction to one of the drugs. You may restart your Pradaxa in 48 hours if the urine is clear or nearly clear.  If, it remains bloody, hold it for another 24 hours.  If it remains bloody after that contact my office for further instructions.   Problems you should report to [place]:  a. Fever greater than 101F. b. Heavy bleeding, or clots (see notes above about blood in urine). c. Inability to urinate. d. Drug reactions (hives, rash, nausea, vomiting, diarrhea). e. Severe burning or pain with urination that is not improving.

## 2017-10-07 NOTE — Anesthesia Preprocedure Evaluation (Addendum)
Anesthesia Evaluation  Patient identified by MRN, date of birth, ID band Patient awake    Reviewed: Allergy & Precautions, NPO status , Patient's Chart, lab work & pertinent test results  Airway Mallampati: III  TM Distance: <3 FB Neck ROM: Limited    Dental  (+) Teeth Intact, Dental Advisory Given   Pulmonary sleep apnea , former smoker,     + decreased breath sounds      Cardiovascular hypertension, Pt. on home beta blockers and Pt. on medications + CAD  + dysrhythmias Atrial Fibrillation + pacemaker  Rhythm:Regular Rate:Normal     Neuro/Psych  Headaches, PSYCHIATRIC DISORDERS Anxiety Depression  Neuromuscular disease CVA    GI/Hepatic Neg liver ROS, GERD  Medicated,  Endo/Other  negative endocrine ROSdiabetes  Renal/GU Renal disease     Musculoskeletal  (+) Arthritis ,   Abdominal   Peds  Hematology negative hematology ROS (+)   Anesthesia Other Findings Day of surgery medications reviewed with the patient.  Reproductive/Obstetrics                           Lab Results  Component Value Date   WBC 7.6 10/03/2017   HGB 12.8 (L) 10/03/2017   HCT 39.4 10/03/2017   MCV 97.5 10/03/2017   PLT 183 10/03/2017   Lab Results  Component Value Date   CREATININE 1.58 (H) 10/03/2017   BUN 27 (H) 10/03/2017   NA 141 10/03/2017   K 4.2 10/03/2017   CL 114 (H) 10/03/2017   CO2 22 10/03/2017   Lab Results  Component Value Date   INR 1.10 10/03/2017   EKG: normal sinus rhythm, RBBB.  Anesthesia Physical Anesthesia Plan  ASA: III  Anesthesia Plan: General   Post-op Pain Management:    Induction: Intravenous  PONV Risk Score and Plan: 3 and Ondansetron, Dexamethasone, Midazolam and Treatment may vary due to age or medical condition  Airway Management Planned: Oral ETT and Video Laryngoscope Planned  Additional Equipment:   Intra-op Plan:   Post-operative Plan: Extubation in  OR  Informed Consent: I have reviewed the patients History and Physical, chart, labs and discussed the procedure including the risks, benefits and alternatives for the proposed anesthesia with the patient or authorized representative who has indicated his/her understanding and acceptance.   Dental advisory given  Plan Discussed with: CRNA  Anesthesia Plan Comments: (Consented wife due to patient sedation. )      Anesthesia Quick Evaluation

## 2017-10-07 NOTE — Procedures (Signed)
Pre Procedure Dx: Nephrolithiasis Post Procedural Dx: Same  Successful fluoroscopic guided placement 2 right sided catheters via both the superior and inferior pole moieties with tips terminating within the urinary bladder to be used for impending PNL.  EBL: Minimal  Complications: None immediate.  Ronny Bacon, MD Pager #: [phone]

## 2017-10-08 DIAGNOSIS — N200 Calculus of kidney: Secondary | ICD-10-CM | POA: Diagnosis not present

## 2017-10-08 MED ORDER — HYDROCODONE-ACETAMINOPHEN 10-325 MG PO TABS: 1.0000 | ORAL_TABLET | ORAL | 0 refills | Status: DC | PRN

## 2017-10-08 NOTE — Progress Notes (Signed)
Patient using the services of [facility]-gets 2 nurse aides and 1 RN

## 2017-10-08 NOTE — Discharge Summary (Signed)
Physician Discharge Summary      Patient ID: Steven Duffy MRN: [id number] DOB/AGE: 76-Jun-1942 76 y.o.  Admit date: 10/07/2017 Discharge date: 10/08/2017  Admission Diagnoses: RIGHT RENAL CALCULI  Discharge Diagnoses:  Active Problems:   Renal calculi   Discharged Condition: good  Hospital Course: He was admitted to the hospital electively to undergo a right percutaneous nephrolithotomy.  This was achieved by placement of nephrostomy access in both his upper and lower pole moieties of his completely duplicated right system.  I then was able to clear all visible stone from the kidney and the procedure had minimal bleeding and therefore was performed "tubeless" with double-J stents placed in both upper and lower pole moieties.  He did well postoperatively.  He is currently having minimal discomfort.  His urine is nearly clear.  A follow-up CT scan revealed some residual calculi.  His stone was taken to my office and sent for compositional analysis.  These felt ready for discharge at this time and will follow up with me as an outpatient for stent removal.  We discussed restarting his Pradaxa in 48 hours as long as his urine remains clear.  Significant Diagnostic Studies: Ct Abdomen Wo Contrast  Result Date: 10/07/2017 CLINICAL DATA:  Right upper and lower pole stag[place] calculi in a duplicated collecting system, status post percutaneous nephrostomy EXAM: CT ABDOMEN WITHOUT CONTRAST TECHNIQUE: Multidetector CT imaging of the abdomen was performed following the standard protocol without IV contrast. COMPARISON:  08/16/2017 FINDINGS: Lower chest: Mild dependent atelectasis in the bilateral lower lobes. Hepatobiliary: Unenhanced liver is unremarkable. Gallbladder is grossly unremarkable, noting layering gallbladder sludge. Pancreas: Within normal limits. Spleen: Within normal limits. Adrenals/Urinary Tract: Adrenal glands are within normal limits. Left kidney is within normal limits.  No  hydronephrosis. Postprocedural defect along the posterior right upper kidney (series 2/ image 26). Overlying scattered small foci of gas and mild perinephric hemorrhage inferiorly (series 2/ image 41). 8-10 nonobstructing right renal calculi, including: --8 x 3 mm right upper pole renal calculus (series 2/ image 23) --6 x 4 mm interpolar right renal calculus (series 2/ image 30) --6 x 4 mm lower pole right renal calculus (series 2/ image 34) Indwelling right upper pole and lower pole double pigtail ureteral stents in a reported duplicated collecting system, incompletely visualized. No hydronephrosis. Stomach/Bowel: Stomach is within normal limits. Visualized bowel is grossly unremarkable, noting scattered colonic diverticulosis. Vascular/Lymphatic: No evidence of abdominal aortic aneurysm. Atherosclerotic calcifications of the abdominal aorta and branch vessels. No suspicious abdominal lymphadenopathy. Other: No abdominal ascites. Musculoskeletal: Visualized osseous structures are within normal limits. IMPRESSION: Postprocedural changes related to percutaneous right nephrostomy. Associated mild perinephric hemorrhage inferiorly. 8-10 nonobstructing right renal calculi, measuring up to 8 mm in the upper pole, as described above. Indwelling right upper pole and lower pole double-pigtail ureteral stents in a reported duplicated collecting system. No hydronephrosis. Electronically Signed   By: Julian Hy M.D.   On: 10/07/2017 20:25   Dg C-arm 1-60 Min-no Report  Result Date: 10/07/2017 Fluoroscopy was utilized by the requesting physician.  No radiographic interpretation.   Ir Ureteral Stent Right New Access W/o Sep Nephrostomy Cath  Result Date: 10/07/2017 INDICATION: Renal stones, access for right sided percutaneous nephrolithotomy. 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 As such, request made for placement of 2  nephroureteral accesses for impending percutaneous nephrolithotomy. EXAM: 1. FLUOROSCOPIC GUIDANCE FOR PUNCTURE OF THE RIGHT RENAL COLLECTING SYSTEM x2 2. RIGHT PERCUTANEOUS  NEPHROSTOMY TUBE PLACEMENT X2 THE COMPARISON:  CT of the abdomen and pelvis - 08/16/2017; abdominal radiograph - 02/12/2017 MEDICATIONS: Vancomycin 1 g IV; The antibiotic was administered in an appropriate time frame prior to skin puncture. ANESTHESIA/SEDATION: Moderate (conscious) sedation was employed during this procedure. A total of Versed 2 mg and Fentanyl 100 mcg was administered intravenously. Moderate Sedation Time: 29 minutes. The patient's level of consciousness and vital signs were monitored continuously by radiology nursing throughout the procedure under my direct supervision. CONTRAST:  20 cc Isovue 300, administered into the right renal collecting system. FLUOROSCOPY TIME:  7 minutes 12 seconds (160.7 mGy) COMPLICATIONS: None immediate. PROCEDURE: Informed written consent was obtained from the patient after a discussion of the risks, benefits, and alternatives to treatment. The flank flank region was prepped with Betadine in a sterile fashion, and a sterile drape was applied covering the operative field. A sterile gown and sterile gloves were used for the procedure. A timeout was performed prior to the initiation of the procedure. A pre procedural spot fluoroscopic image was obtained of the upper abdomen. Ultrasound scanning performed of the kidney was negative for significant hydronephrosis. As such, the stone within superior pole of the right renal collecting system was targeted fluoroscopically with a [place] needle. Ultrasound imaging was used to ensure avoidance of the caudal aspect of the liver. Access to the collecting system was confirmed with injection of small amount of contrast as well as advancement of a Nitrex wire into the collecting system. An Accustick set was utilized to dilate the tract and was subsequently  exchanged for a Kumpe catheter over a Bentson wire. The Kumpe catheter was advanced down the ureter and into the urinary bladder. The catheter was sutured to the skin and capped. Attention was now paid towards accessing the inferior moiety. A stone within the suspected posterior inferior calyx was targeted fluoroscopically after the overlying soft tissues were anesthetized with 1% lidocaine with epinephrine. Contrast injection suggested the initially targeted stone was located within the inferior moiety renal pelvis. As such, an additional stone within a partially opacified posterior inferior calyx was targeted fluoroscopically with a 22 gauge Chiba needle. Access to the collecting system was confirmed with injection with small amount of contrast as well as advancement of a Nitrex wire into the right renal collecting system. An Accustick set was utilized to dilate the tract and was subsequently exchanged over a Bentson wire for a Kumpe catheter which was advanced down the right ureter to the level of the urinary bladder. The catheter was secured to the skin and capped. Postprocedural spot fluoroscopic images were obtained. Dressings were applied. The patient tolerated the procedure well without immediate post procedural complication. FINDINGS: Pre procedural spot radiographic images demonstrates grossly unchanged appearance of known extensive right-sided renal stone burden overlying expected location of both the superior and inferior poles of the known duplicated right renal collecting system. Ultrasound scanning was negative for significant hydronephrosis. Dominant stones within the posterosuperior and posteroinferior aspects of the duplicated right renal moieties were targeted fluoroscopically ultimately allowing advancement of a Kumpe catheter to the level of the urinary bladder. IMPRESSION: 1. Successful fluoroscopic guided placement of a right-sided 5 [place] Kumpe catheter to the level of the urinary bladder  via a stone containing posterior superior calyx 2. Successful fluoroscopic guided placement of a right-sided a 5 [place] Kumpe catheter to the level of the urinary bladder via a stone containing posterior inferior calyx. Electronically Signed   By: Eldridge Abrahams.D.  On: 10/07/2017 11:12   Ir Ureteral Stent Right New Access W/o Sep Nephrostomy Cath  Result Date: 10/07/2017 INDICATION: Renal stones, access for right sided percutaneous nephrolithotomy. 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 As such, request made for placement of 2 nephroureteral accesses for impending percutaneous nephrolithotomy. EXAM: 1. FLUOROSCOPIC GUIDANCE FOR PUNCTURE OF THE RIGHT RENAL COLLECTING SYSTEM x2 2. RIGHT PERCUTANEOUS NEPHROSTOMY TUBE PLACEMENT X2 THE COMPARISON:  CT of the abdomen and pelvis - 08/16/2017; abdominal radiograph - 02/12/2017 MEDICATIONS: Vancomycin 1 g IV; The antibiotic was administered in an appropriate time frame prior to skin puncture. ANESTHESIA/SEDATION: Moderate (conscious) sedation was employed during this procedure. A total of Versed 2 mg and Fentanyl 100 mcg was administered intravenously. Moderate Sedation Time: 29 minutes. The patient's level of consciousness and vital signs were monitored continuously by radiology nursing throughout the procedure under my direct supervision. CONTRAST:  20 cc Isovue 300, administered into the right renal collecting system. FLUOROSCOPY TIME:  7 minutes 12 seconds (509.3 mGy) COMPLICATIONS: None immediate. PROCEDURE: Informed written consent was obtained from the patient after a discussion of the risks, benefits, and alternatives to treatment. The flank flank region was prepped with Betadine in a sterile fashion, and a sterile drape was applied covering the operative field. A sterile gown and sterile gloves were used for the procedure. A timeout was performed prior to the initiation of the  procedure. A pre procedural spot fluoroscopic image was obtained of the upper abdomen. Ultrasound scanning performed of the kidney was negative for significant hydronephrosis. As such, the stone within superior pole of the right renal collecting system was targeted fluoroscopically with a [place] needle. Ultrasound imaging was used to ensure avoidance of the caudal aspect of the liver. Access to the collecting system was confirmed with injection of small amount of contrast as well as advancement of a Nitrex wire into the collecting system. An Accustick set was utilized to dilate the tract and was subsequently exchanged for a Kumpe catheter over a Bentson wire. The Kumpe catheter was advanced down the ureter and into the urinary bladder. The catheter was sutured to the skin and capped. Attention was now paid towards accessing the inferior moiety. A stone within the suspected posterior inferior calyx was targeted fluoroscopically after the overlying soft tissues were anesthetized with 1% lidocaine with epinephrine. Contrast injection suggested the initially targeted stone was located within the inferior moiety renal pelvis. As such, an additional stone within a partially opacified posterior inferior calyx was targeted fluoroscopically with a 22 gauge Chiba needle. Access to the collecting system was confirmed with injection with small amount of contrast as well as advancement of a Nitrex wire into the right renal collecting system. An Accustick set was utilized to dilate the tract and was subsequently exchanged over a Bentson wire for a Kumpe catheter which was advanced down the right ureter to the level of the urinary bladder. The catheter was secured to the skin and capped. Postprocedural spot fluoroscopic images were obtained. Dressings were applied. The patient tolerated the procedure well without immediate post procedural complication. FINDINGS: Pre procedural spot radiographic images demonstrates grossly  unchanged appearance of known extensive right-sided renal stone burden overlying expected location of both the superior and inferior poles of the known duplicated right renal collecting system. Ultrasound scanning was negative for significant hydronephrosis. Dominant stones within the posterosuperior and posteroinferior aspects of the duplicated right renal moieties were targeted fluoroscopically ultimately  allowing advancement of a Kumpe catheter to the level of the urinary bladder. IMPRESSION: 1. Successful fluoroscopic guided placement of a right-sided 5 [place] Kumpe catheter to the level of the urinary bladder via a stone containing posterior superior calyx 2. Successful fluoroscopic guided placement of a right-sided a 5 [place] Kumpe catheter to the level of the urinary bladder via a stone containing posterior inferior calyx. Electronically Signed   By: Sandi Mariscal M.D.   On: 10/07/2017 11:12    Discharge Exam: Blood pressure 138/70, pulse 69, temperature 98.2 F (36.8 C), temperature source Oral, resp. rate 18, height 5\' 7"  (1.702 m), weight 66.2 kg (146 lb), SpO2 98 %.  Awake, alert and oriented.  No apparent distress. Normal respiratory effort Cardiovascular regular rate and rhythm Abdomen soft and nontender.  No flank ecchymoses. Foley catheter draining pink tinged urine.   Disposition: 03-Skilled Nursing Facility  Discharge Instructions    Discharge patient    Complete by:  As directed    Discharge disposition:  01-Home or Self Care   Discharge patient date:  10/08/2017     Allergies as of 10/08/2017      Reactions   Duloxetine Nausea And Vomiting, Other (See Comments)   depression   Iodine Swelling   Lyrica [pregabalin] Other (See Comments)   Depression, suicidal    Shellfish-derived Products Swelling   Procaine Other (See Comments)   unspecified   Ciprofloxacin Rash   Penicillins Rash   CHILDHOOD ALLERGY Has patient had a PCN reaction causing immediate rash,  facial/tongue/throat swelling, SOB or lightheadedness with hypotension: YES Has patient had a PCN reaction causing severe rash involving mucus membranes or skin necrosis: No Has patient had a PCN reaction that required hospitalization NO Has patient had a PCN reaction occurring within the last 10 years: NO  If all of the above answers are "NO", then may proceed with Cephalosporin use.   Tape Rash   NOT paper tape      Medication List    TAKE these medications   acetaminophen 500 MG tablet Commonly known as:  TYLENOL Take 500-1,000 mg by mouth every 4 (four) hours as needed for mild pain.   acetaZOLAMIDE 250 MG tablet Commonly known as:  DIAMOX Take 250 mg by mouth 3 (three) times daily.   atorvastatin 20 MG tablet Commonly known as:  LIPITOR Take 20 mg by mouth at bedtime.   buPROPion 150 MG 24 hr tablet Commonly known as:  WELLBUTRIN XL Take 150 mg by mouth every morning.   cholecalciferol 1000 units tablet Commonly known as:  VITAMIN D Take 1,000 Units by mouth every morning.   dabigatran 150 MG Caps capsule Commonly known as:  PRADAXA Take 150 mg by mouth 2 (two) times daily.   digoxin 0.125 MG tablet Commonly known as:  LANOXIN Take 0.0625 mg by mouth every morning.   diltiazem 180 MG 24 hr capsule Commonly known as:  DILACOR XR Take 180 mg by mouth every morning.   diphenhydrAMINE 25 MG tablet Commonly known as:  SOMINEX Take 25 mg by mouth once. Take 2 capsules by mouth 1 hour prior to surgery   docusate sodium 100 MG capsule Commonly known as:  COLACE Take 100 mg by mouth daily as needed for mild constipation.   dorzolamidel-timolol 22.3-6.8 MG/ML Soln ophthalmic solution Commonly known as:  COSOPT Place 1 drop into the right eye 2 (two) times daily. For glaucoma   escitalopram 20 MG tablet Commonly known as:  LEXAPRO Take 20 mg  by mouth every morning.   finasteride 5 MG tablet Commonly known as:  PROSCAR Take 5 mg by mouth every morning.     gabapentin 300 MG capsule Commonly known as:  NEURONTIN Take 300 mg by mouth 2 (two) times daily.   HYDROcodone-acetaminophen 10-325 MG tablet Commonly known as:  NORCO Take 1-2 tablets by mouth every 4 (four) hours as needed for moderate pain. Maximum dose per 24 hours - 8 pills   LUMIGAN 0.01 % Soln Generic drug:  bimatoprost Place 1 drop into the right eye at bedtime as needed.   magnesium oxide 400 MG tablet Commonly known as:  MAG-OX Take 400 mg by mouth every morning.   metoprolol tartrate 25 MG tablet Commonly known as:  LOPRESSOR Take 12.5 mg by mouth 2 (two) times daily.   MIRALAX PO Take 17 g by mouth daily as needed (constipation).   PERIGUARD Oint Apply 1 application topically daily as needed. TO AFFECTED SITE (FOR RASH)   pilocarpine 1 % ophthalmic solution Commonly known as:  PILOCAR Place 2 drops into the right eye 4 (four) times daily.   predniSONE 50 MG tablet Commonly known as:  DELTASONE Take 50 mg by mouth once. Pt to take 1 tab 13 hours, 7 hours and 1 hour before right percutaneous nephrostolithotomy as directed   ranitidine 150 MG tablet Commonly known as:  ZANTAC Take 150 mg by mouth at bedtime.   tamsulosin 0.4 MG Caps capsule Commonly known as:  FLOMAX Take 0.4 mg by mouth every evening. FOR URINARY SYMPTOMS      Follow-up Information    Kathie Rhodes, MD On 10/14/2017.   Specialty:  Urology Why:  For your appiontment at 1:45 Contact information: [place] [address] [phone]           Signed: Claybon Jabs 10/08/2017, 6:58 AM

## 2017-11-27 NOTE — Telephone Encounter (Signed)
Pharmacy requests refill on medication. Please approve or deny this request.    LOV 01/04/17    No future appointments.

## 2017-11-28 NOTE — Telephone Encounter (Signed)
Pt aware.

## 2017-12-07 ENCOUNTER — Ambulatory Visit: Admit: 2017-12-07 | Discharge: 2017-12-07 | Payer: MEDICARE | Attending: Family Medicine | Primary: Family Medicine

## 2017-12-07 DIAGNOSIS — I10 Essential (primary) hypertension: Secondary | ICD-10-CM

## 2017-12-07 MED ORDER — LISINOPRIL 20 MG PO TABS
20 | ORAL_TABLET | Freq: Every day | ORAL | 3 refills | Status: DC
Start: 2017-12-07 — End: 2017-12-07

## 2017-12-07 MED ORDER — ASPIRIN EC 81 MG PO TBEC
81 | ORAL_TABLET | Freq: Every day | ORAL | 3 refills | Status: DC
Start: 2017-12-07 — End: 2017-12-07

## 2017-12-07 MED ORDER — ASPIRIN EC 81 MG PO TBEC
81 | ORAL_TABLET | Freq: Every day | ORAL | 3 refills | Status: AC
Start: 2017-12-07 — End: ?

## 2017-12-07 MED ORDER — FLUOXETINE HCL 40 MG PO CAPS
40 | ORAL_CAPSULE | Freq: Every day | ORAL | 3 refills | Status: AC
Start: 2017-12-07 — End: ?

## 2017-12-07 MED ORDER — LOVASTATIN 40 MG PO TABS
40 | ORAL_TABLET | Freq: Every evening | ORAL | 3 refills | Status: DC
Start: 2017-12-07 — End: 2017-12-07

## 2017-12-07 MED ORDER — LISINOPRIL 20 MG PO TABS
20 | ORAL_TABLET | Freq: Every day | ORAL | 3 refills | Status: DC
Start: 2017-12-07 — End: 2018-02-27

## 2017-12-07 MED ORDER — NAPROXEN 500 MG PO TABS
500 | ORAL_TABLET | Freq: Two times a day (BID) | ORAL | 3 refills | Status: AC
Start: 2017-12-07 — End: ?

## 2017-12-07 MED ORDER — NAPROXEN 500 MG PO TABS
500 | ORAL_TABLET | Freq: Two times a day (BID) | ORAL | 3 refills | Status: DC
Start: 2017-12-07 — End: 2017-12-07

## 2017-12-07 MED ORDER — FLUOXETINE HCL 40 MG PO CAPS
40 | ORAL_CAPSULE | Freq: Every day | ORAL | 3 refills | Status: DC
Start: 2017-12-07 — End: 2017-12-07

## 2017-12-07 MED ORDER — LOVASTATIN 40 MG PO TABS
40 | ORAL_TABLET | Freq: Every evening | ORAL | 3 refills | Status: AC
Start: 2017-12-07 — End: ?

## 2017-12-07 NOTE — Patient Instructions (Signed)
Patient Education        High Blood Pressure: Care Instructions  Your Care Instructions    If your blood pressure is usually above 130/80, you have high blood pressure, or hypertension. That means the top number is 130 or higher or the bottom number is 80 or higher, or both.  Despite what a lot of people think, high blood pressure usually doesn't cause headaches or make you feel dizzy or lightheaded. It usually has no symptoms. But it does increase your risk for heart attack, stroke, and kidney or eye damage. The higher your blood pressure, the more your risk increases.  Your doctor will give you a goal for your blood pressure. Your goal will be based on your health and your age.  Lifestyle changes, such as eating healthy and being active, are always important to help lower blood pressure. You might also take medicine to reach your blood pressure goal.  Follow-up care is a key part of your treatment and safety. Be sure to make and go to all appointments, and call your doctor if you are having problems. It's also a good idea to know your test results and keep a list of the medicines you take.  How can you care for yourself at home?  Medical treatment   If you stop taking your medicine, your blood pressure will go back up. You may take one or more types of medicine to lower your blood pressure. Be safe with medicines. Take your medicine exactly as prescribed. Call your doctor if you think you are having a problem with your medicine.   Talk to your doctor before you start taking aspirin every day. Aspirin can help certain people lower their risk of a heart attack or stroke. But taking aspirin isn't right for everyone, because it can cause serious bleeding.   See your doctor regularly. You may need to see the doctor more often at first or until your blood pressure comes down.   If you are taking blood pressure medicine, talk to your doctor before you take decongestants or anti-inflammatory medicine, such as  ibuprofen. Some of these medicines can raise blood pressure.   Learn how to check your blood pressure at home.  Lifestyle changes   Stay at a healthy weight. This is especially important if you put on weight around the waist. Losing even 10 pounds can help you lower your blood pressure.   If your doctor recommends it, get more exercise. Walking is a good choice. Bit by bit, increase the amount you walk every day. Try for at least 30 minutes on most days of the week. You also may want to swim, bike, or do other activities.   Avoid or limit alcohol. Talk to your doctor about whether you can drink any alcohol.   Try to limit how much sodium you eat to less than 2,300 milligrams (mg) a day. Your doctor may ask you to try to eat less than 1,500 mg a day.   Eat plenty of fruits (such as bananas and oranges), vegetables, legumes, whole grains, and low-fat dairy products.   Lower the amount of saturated fat in your diet. Saturated fat is found in animal products such as milk, cheese, and meat. Limiting these foods may help you lose weight and also lower your risk for heart disease.   Do not smoke. Smoking increases your risk for heart attack and stroke. If you need help quitting, talk to your doctor about stop-smoking programs and medicines. These can   increase your chances of quitting for good.  When should you call for help?  Call 911 anytime you think you may need emergency care. This may mean having symptoms that suggest that your blood pressure is causing a serious heart or blood vessel problem. Your blood pressure may be over 180/120.  For example, call 911 if:    You have symptoms of a heart attack. These may include:  ? Chest pain or pressure, or a strange feeling in the chest.  ? Sweating.  ? Shortness of breath.  ? Nausea or vomiting.  ? Pain, pressure, or a strange feeling in the back, neck, jaw, or upper belly or in one or both shoulders or arms.  ? Lightheadedness or sudden weakness.  ? A fast or  irregular heartbeat.     You have symptoms of a stroke. These may include:  ? Sudden numbness, tingling, weakness, or loss of movement in your face, arm, or leg, especially on only one side of your body.  ? Sudden vision changes.  ? Sudden trouble speaking.  ? Sudden confusion or trouble understanding simple statements.  ? Sudden problems with walking or balance.  ? A sudden, severe headache that is different from past headaches.     You have severe back or belly pain.   Do not wait until your blood pressure comes down on its own. Get help right away.  Call your doctor now or seek immediate care if:    Your blood pressure is much higher than normal (such as 180/120 or higher), but you don't have symptoms.     You think high blood pressure is causing symptoms, such as:  ? Severe headache.  ? Blurry vision.   Watch closely for changes in your health, and be sure to contact your doctor if:    Your blood pressure measures higher than your doctor recommends at least 2 times. That means the top number is higher or the bottom number is higher, or both.     You think you may be having side effects from your blood pressure medicine.   Where can you learn more?  Go to [email or web address] and sign in to your MyChart account. Enter X567 in the Search Health Information box to learn more about "High Blood Pressure: Care Instructions."     If you do not have an account, please click on the "Sign Up Now" link.  Current as of: November 14, 2016  Content Version: 11.8   2006-2018 [employer]. Care instructions adapted under license by [facility]. If you have questions about a medical condition or this instruction, always ask your healthcare professional. [employer] disclaims any warranty or liability for your use of this information.

## 2017-12-07 NOTE — Progress Notes (Signed)
Chief Complaint   Patient presents with   . Hypertension     f/up    . Medication Refill   Blood pressure is relatively stable at this time  no cp/sob  bp stable    Obese condition still continues  should lose weight  Discussed nutrition    oa well controlled at this time  Stable pain pattern    Mood is controlled for now  No suicidal/homocidal ideation. No psychotic or manic symptoms.    Hi chol continues  Watches diet     Discussed nutrition    Patient Active Problem List   Diagnosis   . Dermatitis   . Essential hypertension   . Primary osteoarthritis involving multiple joints   . 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    Past Medical History:   Diagnosis Date   . Anxiety    . Diverticulitis    . Hand eczema        Past Surgical History:   Procedure Laterality Date   . COLONOSCOPY  12/31/07    DR FLEITES   . TONSILLECTOMY         family history includes Heart Disease in his mother; Liver Cancer in his father; Prostate Cancer in his brother.    Social History     Social History   . Marital status: Married     Spouse name: N/A   . Number of children: N/A   . Years of education: N/A     Occupational History   . Not on file.     Social History Main Topics   . Smoking status: Former Smoker     Packs/day: 1.00     Years: 40.00     Quit date: 12/01/1995   . Smokeless tobacco: Never Used   . Alcohol use Not on file   . Drug use: Unknown   . Sexual activity: Not on file     Other Topics Concern   . Not on file     Social History Narrative   . No narrative on file       No Known Allergies    Review of Systems - General ROS: negative  Psychological ROS: negative  ENT ROS: negative  Hematological and Lymphatic ROS: negative  Endocrine ROS: negative  Respiratory ROS: no cough, shortness of breath, or wheezing  Cardiovascular ROS: no chest pain or dyspnea on exertion  Gastrointestinal  ROS: no abdominal pain, change in bowel habits, or black or bloody stools  Genito-Urinary ROS: no dysuria, trouble voiding, or hematuria  Musculoskeletal ROS: negative  Neurological ROS: no TIA or stroke symptoms  Dermatological ROS: Lesion    Blood pressure 124/80, pulse 72, temperature 97.6 F (36.4 C), temperature source Temporal, resp. rate 16, height 5\' 11"  (1.803 m), weight 249 lb (112.9 kg).    Physical Examination: General appearance - alert, well appearing, and in no distress  Mental status - alert, oriented to person, place, and time  Eyes - pupils equal and reactive, extraocular eye movements intact  Ears - bilateral TM's and external ear canals normal  Mouth - mucous membranes moist, pharynx normal without lesions  Neck - supple, no significant adenopathy  Lymphatics - no palpable lymphadenopathy, no hepatosplenomegaly  Chest - clear to auscultation, no wheezes, rales or rhonchi, symmetric air entry  Heart - normal rate, regular rhythm, normal S1,  S2, no murmurs, rubs, clicks or gallops  Abdomen - soft, nontender, nondistended, no masses or organomegaly, obese  Neurological - alert, oriented, normal speech, no focal findings or movement disorder noted  Musculoskeletal - no joint tenderness, deformity or swelling  Extremities - peripheral pulses normal, no pedal edema, no clubbing or cyanosis  Skin - skin lesion right arm, possible senile macule versus nevus, otherwise dermatitis, o/w normal coloration and turgor, no rashes, no suspicious skin lesions noted;    Assessment:     Diagnosis Orders   1. Essential hypertension     2. Primary osteoarthritis involving multiple joints     3. 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 Dermatitis         Plan:    No orders of the defined types were placed in this encounter.      Outpatient Encounter Prescriptions as of 12/07/2017   Medication Sig Dispense Refill   . FLUoxetine (PROZAC) 40 MG capsule Take 1 capsule by mouth daily 90 capsule 3   . lovastatin  (MEVACOR) 40 MG tablet Take 1 tablet by mouth nightly 90 tablet 3   . lisinopril (PRINIVIL;ZESTRIL) 20 MG tablet Take 1 tablet by mouth daily 90 tablet 3   . aspirin EC 81 MG EC tablet Take 1 tablet by mouth daily 90 tablet 3   . naproxen (NAPROSYN) 500 MG tablet Take 1 tablet by mouth 2 times daily (with meals) 180 tablet 3   . [DISCONTINUED] FLUoxetine (PROZAC) 40 MG capsule Take 1 capsule by mouth daily 90 capsule 3   . [DISCONTINUED] lovastatin (MEVACOR) 40 MG tablet Take 1 tablet by mouth nightly 90 tablet 3   . [DISCONTINUED] lisinopril (PRINIVIL;ZESTRIL) 20 MG tablet Take 1 tablet by mouth daily 90 tablet 3   . [DISCONTINUED] aspirin EC 81 MG EC tablet Take 1 tablet by mouth daily 30 tablet 3   . [DISCONTINUED] naproxen (NAPROSYN) 500 MG tablet Take 1 tablet by mouth 2 times daily (with meals) 180 tablet 3   . [DISCONTINUED] mometasone (ELOCON) 0.1 % cream Apply topically daily. 45 g 3   . [DISCONTINUED] FLUoxetine (PROZAC) 40 MG capsule Take 1 capsule by mouth daily 90 capsule 3   . [DISCONTINUED] lovastatin (MEVACOR) 40 MG tablet Take 1 tablet by mouth nightly 90 tablet 3   . [DISCONTINUED] lisinopril (PRINIVIL;ZESTRIL) 20 MG tablet Take 1 tablet by mouth daily 90 tablet 3   . [DISCONTINUED] aspirin EC 81 MG EC tablet Take 1 tablet by mouth daily 30 tablet 3   . [DISCONTINUED] naproxen (NAPROSYN) 500 MG tablet Take 1 tablet by mouth 2 times daily (with meals) 180 tablet 3     No facility-administered encounter medications on file as of 12/07/2017.      Recommend lifestyle modification.  Report persistence of ecchymosis  Weight loss    Return in about 3 months (around 03/07/2018).    Patient education provided. They understand and agree with this course of treatment. They will return with new or worsening symptoms. Patient instructed to remain current with appropriate annual health maintenance.

## 2018-01-31 NOTE — Telephone Encounter (Signed)
Spoke with patient.  Appointment scheduled with [facility].

## 2018-02-27 ENCOUNTER — Ambulatory Visit: Admit: 2018-02-27 | Discharge: 2018-02-27 | Payer: MEDICARE | Attending: Family Medicine | Primary: Family Medicine

## 2018-02-27 DIAGNOSIS — I10 Essential (primary) hypertension: Secondary | ICD-10-CM

## 2018-02-27 MED ORDER — LISINOPRIL 40 MG PO TABS
40 MG | ORAL_TABLET | Freq: Every day | ORAL | 1 refills | Status: AC
Start: 2018-02-27 — End: ?

## 2018-02-27 NOTE — Progress Notes (Signed)
Chief Complaint   Patient presents with   ??? Hypertension     Patient states at his wellness visit on 02/19/18 BP 157/70   Blood pressure is up  no cp/sob  wants med adjust  discuss options  Recommend lifestyle modification.    Obese condition still continues  should lose weight  Discussed nutrition as well    oa well controlled at this time  Stable pain pattern    Mood is controlled for now  No suicidal/homocidal ideation. No psychotic or manic symptoms.    Hi chol continues  Watches diet     Discussed nutrition with him    Patient Active Problem List   Diagnosis   ??? Dermatitis   ??? Essential hypertension   ??? Primary osteoarthritis involving multiple joints   ??? 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 Tear of lateral cartilage or meniscus of knee, current   ??? Tear of medial cartilage or meniscus of knee, current   ??? Trigger finger   ??? Osteoarthrosis, unspecified whether generalized or localized, lower leg       has a current medication list which includes the following prescription(s): multivitamin adult, lisinopril, fluoxetine, lovastatin, aspirin ec, and naproxen.    Past Medical History:   Diagnosis Date   ??? Anxiety    ??? Diverticulitis    ??? Hand eczema        Past Surgical History:   Procedure Laterality Date   ??? COLONOSCOPY  12/31/07    DR FLEITES   ??? TONSILLECTOMY         family history includes Heart Disease in his mother; Liver Cancer in his father; Prostate Cancer in his brother.    Social History     Socioeconomic History   ??? Marital status: Married     Spouse name: Not on file   ??? Number of children: Not on file   ??? Years of education: Not on file   ??? Highest education level: Not on file   Occupational History   ??? Not on file   Social Needs   ??? Financial resource strain: Not on file   ??? Food insecurity:     Worry: Not on file     Inability: Not on file   ??? Transportation needs:     Medical: Not on file     Non-medical: Not on file   Tobacco Use   ??? Smoking status: Former Smoker     Packs/day: 1.00      Years: 40.00     Pack years: 40.00     Last attempt to quit: 12/01/1995     Years since quitting: 22.2   ??? Smokeless tobacco: Never Used   Substance and Sexual Activity   ??? Alcohol use: Not on file   ??? Drug use: Not on file   ??? Sexual activity: Not on file   Lifestyle   ??? Physical activity:     Days per week: Not on file     Minutes per session: Not on file   ??? Stress: Not on file   Relationships   ??? Social connections:     Talks on phone: Not on file     Gets together: Not on file     Attends religious service: Not on file     Active member of club or organization: Not on file     Attends meetings of clubs or organizations: Not on file     Relationship status: Not on  file   ??? Intimate partner violence:     Fear of current or ex partner: Not on file     Emotionally abused: Not on file     Physically abused: Not on file     Forced sexual activity: Not on file   Other Topics Concern   ??? Not on file   Social History Narrative   ??? Not on file       No Known Allergies    Review of Systems - General ROS: negative  Psychological ROS: negative  ENT ROS: negative  Hematological and Lymphatic ROS: negative  Endocrine ROS: negative  Respiratory ROS: no cough, shortness of breath, or wheezing  Cardiovascular ROS: no chest pain or dyspnea on exertion  Gastrointestinal ROS: no abdominal pain, change in bowel habits, or black or bloody stools  Genito-Urinary ROS: no dysuria, trouble voiding, or hematuria  Musculoskeletal ROS: negative  Neurological ROS: no TIA or stroke symptoms  Dermatological ROS: Lesion    Blood pressure (!) 158/72, pulse 63, temperature 97.6 ??F (36.4 ??C), temperature source Temporal, resp. rate 18, height 5\' 11"  (1.803 m), weight 246 lb (111.6 kg), SpO2 96 %.    Physical Examination: General appearance - alert, well appearing, and in no distress  Mental status - alert, oriented to person, place, and time  Eyes - pupils equal and reactive, extraocular eye movements intact  Ears - bilateral TM's and external ear  canals normal  Mouth - mucous membranes moist, pharynx normal without lesions  Neck - supple, no significant adenopathy  Lymphatics - no palpable lymphadenopathy, no hepatosplenomegaly  Chest - clear to auscultation, no wheezes, rales or rhonchi, symmetric air entry  Heart - normal rate, regular rhythm, normal S1, S2, no murmurs, rubs, clicks or gallops  Abdomen - soft, nontender, nondistended, no masses or organomegaly, obese  Neurological - alert, oriented, normal speech, no focal findings or movement disorder noted  Musculoskeletal - no joint tenderness, deformity or swelling  Extremities - peripheral pulses normal, no pedal edema, no clubbing or cyanosis  Skin - skin lesion right arm, possible senile macule versus nevus, otherwise dermatitis, o/w normal coloration and turgor, no rashes, no suspicious skin lesions noted;    Assessment:     Diagnosis Orders   1. Essential hypertension     2. 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 Primary osteoarthritis involving multiple joints         Plan:    No orders of the defined types were placed in this encounter.      Outpatient Encounter Medications as of 02/27/2018   Medication Sig Dispense Refill   ??? Multiple Vitamins-Minerals (MULTIVITAMIN ADULT) TABS Take by mouth     ??? lisinopril (PRINIVIL;ZESTRIL) 40 MG tablet Take 1 tablet by mouth daily 90 tablet 1   ??? FLUoxetine (PROZAC) 40 MG capsule Take 1 capsule by mouth daily 90 capsule 3   ??? lovastatin (MEVACOR) 40 MG tablet Take 1 tablet by mouth nightly 90 tablet 3   ??? aspirin EC 81 MG EC tablet Take 1 tablet by mouth daily 90 tablet 3   ??? naproxen (NAPROSYN) 500 MG tablet Take 1 tablet by mouth 2 times daily (with meals) 180 tablet 3   ??? [DISCONTINUED] lisinopril (PRINIVIL;ZESTRIL) 20 MG tablet Take 1 tablet by mouth daily 90 tablet 3     No facility-administered encounter medications on file as of 02/27/2018.      Recommend lifestyle modification.  Report persistence of ecchymosis  Weight loss    No follow-ups  on file.    Patient education provided. They understand and agree with this course of treatment. They will return with new or worsening symptoms. Patient instructed to remain current with appropriate annual health maintenance.

## 2018-03-25 ENCOUNTER — Encounter: Attending: Family Medicine | Primary: Family Medicine

## 2018-03-25 ENCOUNTER — Encounter: Attending: Emergency Medicine | Primary: Family Medicine

## 2018-04-17 ENCOUNTER — Ambulatory Visit (HOSPITAL_COMMUNITY)
Admission: RE | Admit: 2018-04-17 | Discharge: 2018-04-17 | Disposition: A | Discharge status: Home / Self Care | Payer: Medicare (Managed Care) | Source: Ambulatory Visit | Attending: Family Medicine | Admitting: Family Medicine

## 2018-04-17 ENCOUNTER — Other Ambulatory Visit (HOSPITAL_COMMUNITY): Payer: Self-pay | Admitting: Family Medicine

## 2018-04-17 DIAGNOSIS — E11621 Type 2 diabetes mellitus with foot ulcer: Secondary | ICD-10-CM | POA: Insufficient documentation

## 2018-04-17 DIAGNOSIS — Z8631 Personal history of diabetic foot ulcer: Secondary | ICD-10-CM | POA: Diagnosis not present

## 2018-04-17 DIAGNOSIS — L97428 Non-pressure chronic ulcer of left heel and midfoot with other specified severity: Secondary | ICD-10-CM | POA: Diagnosis present

## 2018-04-25 ENCOUNTER — Encounter (HOSPITAL_BASED_OUTPATIENT_CLINIC_OR_DEPARTMENT_OTHER): Payer: Medicare (Managed Care) | Attending: Internal Medicine

## 2018-04-25 DIAGNOSIS — I120 Hypertensive chronic kidney disease with stage 5 chronic kidney disease or end stage renal disease: Secondary | ICD-10-CM | POA: Diagnosis not present

## 2018-04-25 DIAGNOSIS — L89620 Pressure ulcer of left heel, unstageable: Secondary | ICD-10-CM | POA: Insufficient documentation

## 2018-04-25 DIAGNOSIS — G4730 Sleep apnea, unspecified: Secondary | ICD-10-CM | POA: Diagnosis not present

## 2018-04-25 DIAGNOSIS — Z7901 Long term (current) use of anticoagulants: Secondary | ICD-10-CM | POA: Diagnosis not present

## 2018-04-25 DIAGNOSIS — Z87891 Personal history of nicotine dependence: Secondary | ICD-10-CM | POA: Insufficient documentation

## 2018-04-25 DIAGNOSIS — I10 Essential (primary) hypertension: Secondary | ICD-10-CM | POA: Insufficient documentation

## 2018-04-25 DIAGNOSIS — N186 End stage renal disease: Secondary | ICD-10-CM | POA: Insufficient documentation

## 2018-04-25 DIAGNOSIS — I4891 Unspecified atrial fibrillation: Secondary | ICD-10-CM | POA: Insufficient documentation

## 2018-04-25 DIAGNOSIS — L03115 Cellulitis of right lower limb: Secondary | ICD-10-CM | POA: Diagnosis not present

## 2018-04-25 DIAGNOSIS — Z950 Presence of cardiac pacemaker: Secondary | ICD-10-CM | POA: Diagnosis not present

## 2018-04-25 DIAGNOSIS — E1122 Type 2 diabetes mellitus with diabetic chronic kidney disease: Secondary | ICD-10-CM | POA: Diagnosis not present

## 2018-05-02 DIAGNOSIS — L89620 Pressure ulcer of left heel, unstageable: Secondary | ICD-10-CM | POA: Diagnosis not present

## 2018-05-16 ENCOUNTER — Encounter (HOSPITAL_BASED_OUTPATIENT_CLINIC_OR_DEPARTMENT_OTHER): Payer: Medicare (Managed Care) | Attending: Internal Medicine

## 2018-05-16 DIAGNOSIS — L89620 Pressure ulcer of left heel, unstageable: Secondary | ICD-10-CM | POA: Diagnosis present

## 2018-05-16 DIAGNOSIS — I120 Hypertensive chronic kidney disease with stage 5 chronic kidney disease or end stage renal disease: Secondary | ICD-10-CM | POA: Diagnosis not present

## 2018-05-16 DIAGNOSIS — L89623 Pressure ulcer of left heel, stage 3: Secondary | ICD-10-CM | POA: Diagnosis present

## 2018-05-16 DIAGNOSIS — G4730 Sleep apnea, unspecified: Secondary | ICD-10-CM | POA: Insufficient documentation

## 2018-05-16 DIAGNOSIS — N186 End stage renal disease: Secondary | ICD-10-CM | POA: Diagnosis not present

## 2018-05-16 DIAGNOSIS — I2510 Atherosclerotic heart disease of native coronary artery without angina pectoris: Secondary | ICD-10-CM | POA: Insufficient documentation

## 2018-05-30 DIAGNOSIS — L89623 Pressure ulcer of left heel, stage 3: Secondary | ICD-10-CM | POA: Diagnosis not present

## 2018-06-13 ENCOUNTER — Encounter (HOSPITAL_BASED_OUTPATIENT_CLINIC_OR_DEPARTMENT_OTHER): Payer: Medicare (Managed Care) | Attending: Internal Medicine

## 2018-06-13 DIAGNOSIS — I120 Hypertensive chronic kidney disease with stage 5 chronic kidney disease or end stage renal disease: Secondary | ICD-10-CM | POA: Insufficient documentation

## 2018-06-13 DIAGNOSIS — Z87891 Personal history of nicotine dependence: Secondary | ICD-10-CM | POA: Diagnosis not present

## 2018-06-13 DIAGNOSIS — G4730 Sleep apnea, unspecified: Secondary | ICD-10-CM | POA: Diagnosis not present

## 2018-06-13 DIAGNOSIS — L89623 Pressure ulcer of left heel, stage 3: Secondary | ICD-10-CM | POA: Diagnosis not present

## 2018-06-13 DIAGNOSIS — E1122 Type 2 diabetes mellitus with diabetic chronic kidney disease: Secondary | ICD-10-CM | POA: Insufficient documentation

## 2018-06-13 DIAGNOSIS — I2510 Atherosclerotic heart disease of native coronary artery without angina pectoris: Secondary | ICD-10-CM | POA: Insufficient documentation

## 2018-06-13 DIAGNOSIS — N186 End stage renal disease: Secondary | ICD-10-CM | POA: Insufficient documentation

## 2018-06-16 ENCOUNTER — Other Ambulatory Visit (HOSPITAL_COMMUNITY): Payer: Self-pay | Admitting: Family Medicine

## 2018-06-16 ENCOUNTER — Ambulatory Visit (HOSPITAL_COMMUNITY)
Admission: RE | Admit: 2018-06-16 | Discharge: 2018-06-16 | Disposition: A | Discharge status: Home / Self Care | Payer: Medicare (Managed Care) | Source: Ambulatory Visit | Attending: Family Medicine | Admitting: Family Medicine

## 2018-06-16 DIAGNOSIS — R05 Cough: Secondary | ICD-10-CM

## 2018-06-16 DIAGNOSIS — J90 Pleural effusion, not elsewhere classified: Secondary | ICD-10-CM | POA: Insufficient documentation

## 2018-06-16 DIAGNOSIS — R059 Cough, unspecified: Secondary | ICD-10-CM

## 2018-06-16 DIAGNOSIS — R911 Solitary pulmonary nodule: Secondary | ICD-10-CM | POA: Diagnosis not present

## 2018-06-17 ENCOUNTER — Other Ambulatory Visit (HOSPITAL_COMMUNITY): Payer: Self-pay | Admitting: Family Medicine

## 2018-06-17 DIAGNOSIS — I509 Heart failure, unspecified: Secondary | ICD-10-CM

## 2018-06-19 ENCOUNTER — Ambulatory Visit (HOSPITAL_COMMUNITY)
Admission: RE | Admit: 2018-06-19 | Discharge: 2018-06-19 | Disposition: A | Discharge status: Home / Self Care | Payer: Medicare (Managed Care) | Source: Ambulatory Visit | Attending: Internal Medicine | Admitting: Internal Medicine

## 2018-06-19 DIAGNOSIS — R001 Bradycardia, unspecified: Secondary | ICD-10-CM | POA: Diagnosis not present

## 2018-06-19 DIAGNOSIS — I2510 Atherosclerotic heart disease of native coronary artery without angina pectoris: Secondary | ICD-10-CM | POA: Diagnosis not present

## 2018-06-19 DIAGNOSIS — I110 Hypertensive heart disease with heart failure: Secondary | ICD-10-CM | POA: Insufficient documentation

## 2018-06-19 DIAGNOSIS — Z8673 Personal history of transient ischemic attack (TIA), and cerebral infarction without residual deficits: Secondary | ICD-10-CM | POA: Diagnosis not present

## 2018-06-19 DIAGNOSIS — I472 Ventricular tachycardia: Secondary | ICD-10-CM | POA: Diagnosis not present

## 2018-06-19 DIAGNOSIS — E1159 Type 2 diabetes mellitus with other circulatory complications: Secondary | ICD-10-CM | POA: Diagnosis not present

## 2018-06-19 DIAGNOSIS — I5021 Acute systolic (congestive) heart failure: Secondary | ICD-10-CM | POA: Insufficient documentation

## 2018-06-19 DIAGNOSIS — I509 Heart failure, unspecified: Secondary | ICD-10-CM | POA: Diagnosis not present

## 2018-06-19 DIAGNOSIS — E785 Hyperlipidemia, unspecified: Secondary | ICD-10-CM | POA: Diagnosis not present

## 2018-06-19 DIAGNOSIS — I4891 Unspecified atrial fibrillation: Secondary | ICD-10-CM | POA: Diagnosis not present

## 2018-06-19 NOTE — Progress Notes (Signed)
  Echocardiogram 2D Echocardiogram has been performed.  Steven Duffy 06/19/2018, 2:12 PM

## 2018-07-02 ENCOUNTER — Other Ambulatory Visit: Payer: Self-pay | Admitting: Family Medicine

## 2018-07-03 ENCOUNTER — Other Ambulatory Visit (HOSPITAL_COMMUNITY): Payer: Self-pay | Admitting: Family Medicine

## 2018-07-03 DIAGNOSIS — R911 Solitary pulmonary nodule: Secondary | ICD-10-CM

## 2018-07-03 NOTE — Telephone Encounter (Signed)
Patient will follow Dr. Alan Ripper. Patient visit the office tomorrow to sign a Release of Medical Records from.

## 2018-07-03 NOTE — Telephone Encounter (Signed)
[facility]

## 2018-07-04 DIAGNOSIS — L89623 Pressure ulcer of left heel, stage 3: Secondary | ICD-10-CM | POA: Diagnosis not present

## 2018-07-18 ENCOUNTER — Other Ambulatory Visit: Payer: Self-pay

## 2018-07-18 ENCOUNTER — Emergency Department (HOSPITAL_COMMUNITY): Payer: Medicare (Managed Care)

## 2018-07-18 ENCOUNTER — Inpatient Hospital Stay (HOSPITAL_COMMUNITY)
Admission: EM | Admit: 2018-07-18 | Discharge: 2018-07-25 | DRG: 580 | Disposition: A | Discharge status: Skilled Nursing Facility | Payer: Medicare (Managed Care) | Attending: Internal Medicine | Admitting: Internal Medicine

## 2018-07-18 ENCOUNTER — Encounter (HOSPITAL_BASED_OUTPATIENT_CLINIC_OR_DEPARTMENT_OTHER): Payer: Medicare (Managed Care) | Attending: Internal Medicine

## 2018-07-18 ENCOUNTER — Encounter (HOSPITAL_COMMUNITY): Payer: Self-pay | Admitting: Emergency Medicine

## 2018-07-18 DIAGNOSIS — Z91048 Other nonmedicinal substance allergy status: Secondary | ICD-10-CM | POA: Diagnosis not present

## 2018-07-18 DIAGNOSIS — G8194 Hemiplegia, unspecified affecting left nondominant side: Secondary | ICD-10-CM | POA: Diagnosis present

## 2018-07-18 DIAGNOSIS — K219 Gastro-esophageal reflux disease without esophagitis: Secondary | ICD-10-CM | POA: Diagnosis present

## 2018-07-18 DIAGNOSIS — N179 Acute kidney failure, unspecified: Secondary | ICD-10-CM | POA: Diagnosis present

## 2018-07-18 DIAGNOSIS — F419 Anxiety disorder, unspecified: Secondary | ICD-10-CM | POA: Diagnosis present

## 2018-07-18 DIAGNOSIS — Z91010 Allergy to peanuts: Secondary | ICD-10-CM

## 2018-07-18 DIAGNOSIS — I2510 Atherosclerotic heart disease of native coronary artery without angina pectoris: Secondary | ICD-10-CM | POA: Diagnosis present

## 2018-07-18 DIAGNOSIS — Z87442 Personal history of urinary calculi: Secondary | ICD-10-CM

## 2018-07-18 DIAGNOSIS — N401 Enlarged prostate with lower urinary tract symptoms: Secondary | ICD-10-CM | POA: Diagnosis present

## 2018-07-18 DIAGNOSIS — Z87891 Personal history of nicotine dependence: Secondary | ICD-10-CM | POA: Diagnosis not present

## 2018-07-18 DIAGNOSIS — Z79899 Other long term (current) drug therapy: Secondary | ICD-10-CM

## 2018-07-18 DIAGNOSIS — Z993 Dependence on wheelchair: Secondary | ICD-10-CM

## 2018-07-18 DIAGNOSIS — E039 Hypothyroidism, unspecified: Secondary | ICD-10-CM | POA: Diagnosis present

## 2018-07-18 DIAGNOSIS — I120 Hypertensive chronic kidney disease with stage 5 chronic kidney disease or end stage renal disease: Secondary | ICD-10-CM | POA: Insufficient documentation

## 2018-07-18 DIAGNOSIS — I10 Essential (primary) hypertension: Secondary | ICD-10-CM | POA: Diagnosis not present

## 2018-07-18 DIAGNOSIS — Z881 Allergy status to other antibiotic agents status: Secondary | ICD-10-CM

## 2018-07-18 DIAGNOSIS — I959 Hypotension, unspecified: Secondary | ICD-10-CM | POA: Diagnosis present

## 2018-07-18 DIAGNOSIS — Z45018 Encounter for adjustment and management of other part of cardiac pacemaker: Secondary | ICD-10-CM

## 2018-07-18 DIAGNOSIS — N186 End stage renal disease: Secondary | ICD-10-CM | POA: Insufficient documentation

## 2018-07-18 DIAGNOSIS — E876 Hypokalemia: Secondary | ICD-10-CM | POA: Diagnosis present

## 2018-07-18 DIAGNOSIS — Z9989 Dependence on other enabling machines and devices: Secondary | ICD-10-CM | POA: Diagnosis not present

## 2018-07-18 DIAGNOSIS — I509 Heart failure, unspecified: Secondary | ICD-10-CM | POA: Diagnosis present

## 2018-07-18 DIAGNOSIS — I69354 Hemiplegia and hemiparesis following cerebral infarction affecting left non-dominant side: Secondary | ICD-10-CM | POA: Diagnosis not present

## 2018-07-18 DIAGNOSIS — Z91013 Allergy to seafood: Secondary | ICD-10-CM | POA: Diagnosis not present

## 2018-07-18 DIAGNOSIS — E1169 Type 2 diabetes mellitus with other specified complication: Secondary | ICD-10-CM | POA: Diagnosis present

## 2018-07-18 DIAGNOSIS — L97429 Non-pressure chronic ulcer of left heel and midfoot with unspecified severity: Secondary | ICD-10-CM | POA: Diagnosis present

## 2018-07-18 DIAGNOSIS — L03116 Cellulitis of left lower limb: Principal | ICD-10-CM | POA: Diagnosis present

## 2018-07-18 DIAGNOSIS — E118 Type 2 diabetes mellitus with unspecified complications: Secondary | ICD-10-CM

## 2018-07-18 DIAGNOSIS — E1122 Type 2 diabetes mellitus with diabetic chronic kidney disease: Secondary | ICD-10-CM | POA: Diagnosis present

## 2018-07-18 DIAGNOSIS — Z79891 Long term (current) use of opiate analgesic: Secondary | ICD-10-CM

## 2018-07-18 DIAGNOSIS — F1021 Alcohol dependence, in remission: Secondary | ICD-10-CM | POA: Diagnosis present

## 2018-07-18 DIAGNOSIS — N138 Other obstructive and reflux uropathy: Secondary | ICD-10-CM | POA: Diagnosis present

## 2018-07-18 DIAGNOSIS — Z7952 Long term (current) use of systemic steroids: Secondary | ICD-10-CM

## 2018-07-18 DIAGNOSIS — I480 Paroxysmal atrial fibrillation: Secondary | ICD-10-CM | POA: Diagnosis present

## 2018-07-18 DIAGNOSIS — E119 Type 2 diabetes mellitus without complications: Secondary | ICD-10-CM

## 2018-07-18 DIAGNOSIS — M86172 Other acute osteomyelitis, left ankle and foot: Secondary | ICD-10-CM | POA: Diagnosis present

## 2018-07-18 DIAGNOSIS — M86072 Acute hematogenous osteomyelitis, left ankle and foot: Secondary | ICD-10-CM | POA: Diagnosis not present

## 2018-07-18 DIAGNOSIS — L89624 Pressure ulcer of left heel, stage 4: Secondary | ICD-10-CM | POA: Insufficient documentation

## 2018-07-18 DIAGNOSIS — Z888 Allergy status to other drugs, medicaments and biological substances status: Secondary | ICD-10-CM

## 2018-07-18 DIAGNOSIS — G4733 Obstructive sleep apnea (adult) (pediatric): Secondary | ICD-10-CM | POA: Diagnosis present

## 2018-07-18 DIAGNOSIS — D638 Anemia in other chronic diseases classified elsewhere: Secondary | ICD-10-CM | POA: Diagnosis present

## 2018-07-18 DIAGNOSIS — M869 Osteomyelitis, unspecified: Secondary | ICD-10-CM | POA: Diagnosis not present

## 2018-07-18 DIAGNOSIS — F329 Major depressive disorder, single episode, unspecified: Secondary | ICD-10-CM | POA: Diagnosis present

## 2018-07-18 DIAGNOSIS — R569 Unspecified convulsions: Secondary | ICD-10-CM

## 2018-07-18 DIAGNOSIS — Z880 Allergy status to penicillin: Secondary | ICD-10-CM

## 2018-07-18 DIAGNOSIS — N183 Chronic kidney disease, stage 3 unspecified: Secondary | ICD-10-CM | POA: Diagnosis present

## 2018-07-18 DIAGNOSIS — G4730 Sleep apnea, unspecified: Secondary | ICD-10-CM | POA: Diagnosis not present

## 2018-07-18 DIAGNOSIS — Z86010 Personal history of colonic polyps: Secondary | ICD-10-CM

## 2018-07-18 DIAGNOSIS — I69398 Other sequelae of cerebral infarction: Secondary | ICD-10-CM | POA: Diagnosis not present

## 2018-07-18 DIAGNOSIS — F32A Depression, unspecified: Secondary | ICD-10-CM | POA: Diagnosis present

## 2018-07-18 DIAGNOSIS — Z9842 Cataract extraction status, left eye: Secondary | ICD-10-CM

## 2018-07-18 DIAGNOSIS — I130 Hypertensive heart and chronic kidney disease with heart failure and stage 1 through stage 4 chronic kidney disease, or unspecified chronic kidney disease: Secondary | ICD-10-CM | POA: Diagnosis present

## 2018-07-18 DIAGNOSIS — E785 Hyperlipidemia, unspecified: Secondary | ICD-10-CM | POA: Diagnosis present

## 2018-07-18 DIAGNOSIS — L97509 Non-pressure chronic ulcer of other part of unspecified foot with unspecified severity: Secondary | ICD-10-CM

## 2018-07-18 DIAGNOSIS — E1140 Type 2 diabetes mellitus with diabetic neuropathy, unspecified: Secondary | ICD-10-CM | POA: Diagnosis present

## 2018-07-18 DIAGNOSIS — R0600 Dyspnea, unspecified: Secondary | ICD-10-CM

## 2018-07-18 DIAGNOSIS — Z7901 Long term (current) use of anticoagulants: Secondary | ICD-10-CM

## 2018-07-18 DIAGNOSIS — N400 Enlarged prostate without lower urinary tract symptoms: Secondary | ICD-10-CM | POA: Diagnosis present

## 2018-07-18 DIAGNOSIS — Z7902 Long term (current) use of antithrombotics/antiplatelets: Secondary | ICD-10-CM

## 2018-07-18 DIAGNOSIS — Z950 Presence of cardiac pacemaker: Secondary | ICD-10-CM

## 2018-07-18 DIAGNOSIS — H547 Unspecified visual loss: Secondary | ICD-10-CM | POA: Diagnosis present

## 2018-07-18 DIAGNOSIS — H409 Unspecified glaucoma: Secondary | ICD-10-CM | POA: Diagnosis present

## 2018-07-18 DIAGNOSIS — M1990 Unspecified osteoarthritis, unspecified site: Secondary | ICD-10-CM | POA: Diagnosis present

## 2018-07-18 DIAGNOSIS — E11621 Type 2 diabetes mellitus with foot ulcer: Secondary | ICD-10-CM | POA: Diagnosis present

## 2018-07-18 LAB — COMPREHENSIVE METABOLIC PANEL
ALT: 14 U/L (ref 0–44)
AST: 19 U/L (ref 15–41)
Albumin: 3.2 g/dL — ABNORMAL LOW (ref 3.5–5.0)
Alkaline Phosphatase: 113 U/L (ref 38–126)
Anion gap: 8 (ref 5–15)
BUN: 28 mg/dL — AB (ref 8–23)
CALCIUM: 8.7 mg/dL — AB (ref 8.9–10.3)
CO2: 22 mmol/L (ref 22–32)
CREATININE: 1.58 mg/dL — AB (ref 0.61–1.24)
Chloride: 111 mmol/L (ref 98–111)
GFR calc non Af Amer: 41 mL/min — ABNORMAL LOW (ref >60)
GFR, EST AFRICAN AMERICAN: 47 mL/min — AB (ref >60)
GLUCOSE: 156 mg/dL — AB (ref 70–99)
Potassium: 3.3 mmol/L — ABNORMAL LOW (ref 3.5–5.1)
SODIUM: 141 mmol/L (ref 135–145)
Total Bilirubin: 0.4 mg/dL (ref 0.3–1.2)
Total Protein: 7.1 g/dL (ref 6.5–8.1)

## 2018-07-18 LAB — CBC WITH DIFFERENTIAL/PLATELET
Basophils Absolute: 0 10*3/uL (ref 0.0–0.1)
Basophils Relative: 0 %
EOS ABS: 0.1 10*3/uL (ref 0.0–0.7)
EOS PCT: 1 %
HCT: 44.5 % (ref 39.0–52.0)
Hemoglobin: 13.7 g/dL (ref 13.0–17.0)
LYMPHS ABS: 1.3 10*3/uL (ref 0.7–4.0)
Lymphocytes Relative: 14 %
MCH: 28.8 pg (ref 26.0–34.0)
MCHC: 30.8 g/dL (ref 30.0–36.0)
MCV: 93.5 fL (ref 78.0–100.0)
MONOS PCT: 9 %
Monocytes Absolute: 0.9 10*3/uL (ref 0.1–1.0)
Neutro Abs: 7.1 10*3/uL (ref 1.7–7.7)
Neutrophils Relative %: 76 %
PLATELETS: 273 10*3/uL (ref 150–400)
RBC: 4.76 MIL/uL (ref 4.22–5.81)
RDW: 14.3 % (ref 11.5–15.5)
WBC: 9.4 10*3/uL (ref 4.0–10.5)

## 2018-07-18 LAB — PROTIME-INR
INR: 1.49
PROTHROMBIN TIME: 17.9 s — AB (ref 11.4–15.2)

## 2018-07-18 LAB — SEDIMENTATION RATE: Sed Rate: 17 mm/hr — ABNORMAL HIGH (ref 0–16)

## 2018-07-18 LAB — I-STAT CG4 LACTIC ACID, ED: Lactic Acid, Venous: 1.26 mmol/L (ref 0.5–1.9)

## 2018-07-18 MED ORDER — POLYETHYLENE GLYCOL 3350 17 G PO PACK: 17.0000 g | PACK | Freq: Every day | ORAL | Status: DC | PRN

## 2018-07-18 MED ORDER — ONDANSETRON HCL 4 MG/2ML IJ SOLN
4.0000 mg | Freq: Once | INTRAMUSCULAR | Status: AC
  Administered 2018-07-18: 4 mg via INTRAVENOUS
  Filled 2018-07-18: qty 2

## 2018-07-18 MED ORDER — HYDROCODONE-ACETAMINOPHEN 10-325 MG PO TABS: 1.0000 | ORAL_TABLET | ORAL | Status: DC | PRN

## 2018-07-18 MED ORDER — GABAPENTIN 300 MG PO CAPS
300.0000 mg | ORAL_CAPSULE | Freq: Two times a day (BID) | ORAL | Status: DC
  Administered 2018-07-18 – 2018-07-22 (×7): 300 mg via ORAL
  Filled 2018-07-18 (×7): qty 1

## 2018-07-18 MED ORDER — MORPHINE SULFATE (PF) 4 MG/ML IV SOLN: 4.0000 mg | Freq: Once | INTRAVENOUS | Status: DC

## 2018-07-18 MED ORDER — ACETAMINOPHEN 325 MG PO TABS
650.0000 mg | ORAL_TABLET | Freq: Four times a day (QID) | ORAL | Status: DC | PRN
  Administered 2018-07-24: 650 mg via ORAL
  Filled 2018-07-18: qty 2

## 2018-07-18 MED ORDER — ACETAMINOPHEN 650 MG RE SUPP: 650.0000 mg | Freq: Four times a day (QID) | RECTAL | Status: DC | PRN

## 2018-07-18 MED ORDER — ESCITALOPRAM OXALATE 20 MG PO TABS
20.0000 mg | ORAL_TABLET | Freq: Every morning | ORAL | Status: DC
  Administered 2018-07-19 – 2018-07-25 (×7): 20 mg via ORAL
  Filled 2018-07-18 (×7): qty 1

## 2018-07-18 MED ORDER — ONDANSETRON HCL 4 MG PO TABS: 4.0000 mg | ORAL_TABLET | Freq: Four times a day (QID) | ORAL | Status: DC | PRN

## 2018-07-18 MED ORDER — SODIUM CHLORIDE 0.9 % IV SOLN
2.0000 g | Freq: Once | INTRAVENOUS | Status: AC
  Administered 2018-07-18: 2 g via INTRAVENOUS
  Filled 2018-07-18: qty 2

## 2018-07-18 MED ORDER — SENNOSIDES-DOCUSATE SODIUM 8.6-50 MG PO TABS
1.0000 | ORAL_TABLET | Freq: Every evening | ORAL | Status: DC | PRN
  Administered 2018-07-20: 1 via ORAL
  Filled 2018-07-18: qty 1

## 2018-07-18 MED ORDER — BOOST HIGH PROTEIN PO LIQD
30.0000 mL | Freq: Three times a day (TID) | ORAL | Status: DC
  Filled 2018-07-18: qty 237

## 2018-07-18 MED ORDER — DILTIAZEM HCL ER COATED BEADS 180 MG PO CP24
180.0000 mg | ORAL_CAPSULE | Freq: Every morning | ORAL | Status: DC
  Administered 2018-07-19 – 2018-07-25 (×6): 180 mg via ORAL
  Filled 2018-07-18 (×9): qty 1

## 2018-07-18 MED ORDER — HYDROCODONE-ACETAMINOPHEN 5-325 MG PO TABS
1.0000 | ORAL_TABLET | ORAL | Status: DC | PRN
  Administered 2018-07-19 – 2018-07-21 (×4): 2 via ORAL
  Administered 2018-07-22 (×2): 1 via ORAL
  Filled 2018-07-18 (×2): qty 2
  Filled 2018-07-18: qty 1
  Filled 2018-07-18 (×3): qty 2
  Filled 2018-07-18: qty 1
  Filled 2018-07-18: qty 2

## 2018-07-18 MED ORDER — FUROSEMIDE 40 MG PO TABS
40.0000 mg | ORAL_TABLET | Freq: Every day | ORAL | Status: DC
  Administered 2018-07-19 – 2018-07-25 (×7): 40 mg via ORAL
  Filled 2018-07-18 (×7): qty 1

## 2018-07-18 MED ORDER — DORZOLAMIDE HCL-TIMOLOL MAL PF 22.3-6.8 MG/ML OP SOLN: 1.0000 [drp] | Freq: Two times a day (BID) | OPHTHALMIC | Status: DC

## 2018-07-18 MED ORDER — BISACODYL 10 MG RE SUPP: 10.0000 mg | Freq: Every day | RECTAL | Status: DC | PRN

## 2018-07-18 MED ORDER — VANCOMYCIN HCL IN DEXTROSE 1-5 GM/200ML-% IV SOLN
1000.0000 mg | Freq: Once | INTRAVENOUS | Status: AC
  Administered 2018-07-18: 1000 mg via INTRAVENOUS
  Filled 2018-07-18: qty 200

## 2018-07-18 MED ORDER — BUPROPION HCL ER (XL) 150 MG PO TB24
150.0000 mg | ORAL_TABLET | Freq: Every morning | ORAL | Status: DC
  Administered 2018-07-19 – 2018-07-25 (×7): 150 mg via ORAL
  Filled 2018-07-18 (×7): qty 1

## 2018-07-18 MED ORDER — VANCOMYCIN HCL IN DEXTROSE 750-5 MG/150ML-% IV SOLN
750.0000 mg | INTRAVENOUS | Status: DC
  Administered 2018-07-19 – 2018-07-22 (×4): 750 mg via INTRAVENOUS
  Filled 2018-07-18 (×6): qty 150

## 2018-07-18 MED ORDER — LATANOPROST 0.005 % OP SOLN
1.0000 [drp] | Freq: Every day | OPHTHALMIC | Status: DC
  Administered 2018-07-18 – 2018-07-24 (×7): 1 [drp] via OPHTHALMIC
  Filled 2018-07-18: qty 2.5

## 2018-07-18 MED ORDER — ONDANSETRON HCL 4 MG/2ML IJ SOLN: 4.0000 mg | Freq: Four times a day (QID) | INTRAMUSCULAR | Status: DC | PRN

## 2018-07-18 MED ORDER — TIMOLOL MALEATE 0.5 % OP SOLN
1.0000 [drp] | Freq: Two times a day (BID) | OPHTHALMIC | Status: DC
Start: 2018-07-18 — End: 2018-07-25
  Administered 2018-07-18 – 2018-07-25 (×14): 1 [drp] via OPHTHALMIC
  Filled 2018-07-18: qty 5

## 2018-07-18 MED ORDER — MORPHINE SULFATE (PF) 2 MG/ML IV SOLN
1.0000 mg | INTRAVENOUS | Status: DC | PRN
  Administered 2018-07-20 – 2018-07-24 (×3): 1 mg via INTRAVENOUS
  Filled 2018-07-18 (×3): qty 1

## 2018-07-18 MED ORDER — LEVOTHYROXINE SODIUM 25 MCG PO TABS
37.5000 ug | ORAL_TABLET | Freq: Every day | ORAL | Status: DC
  Administered 2018-07-19 – 2018-07-22 (×4): 37.5 ug via ORAL
  Administered 2018-07-23: 37.2 ug via ORAL
  Administered 2018-07-24 – 2018-07-25 (×2): 37.5 ug via ORAL
  Filled 2018-07-18 (×7): qty 2

## 2018-07-18 MED ORDER — ATROPINE SULFATE 1 % OP SOLN
1.0000 [drp] | Freq: Every day | OPHTHALMIC | Status: DC
  Administered 2018-07-19 – 2018-07-25 (×7): 1 [drp] via OPHTHALMIC
  Filled 2018-07-18: qty 2

## 2018-07-18 MED ORDER — PILOCARPINE HCL 1 % OP SOLN
2.0000 [drp] | Freq: Four times a day (QID) | OPHTHALMIC | Status: DC
  Administered 2018-07-18 – 2018-07-25 (×21): 2 [drp] via OPHTHALMIC
  Filled 2018-07-18 (×49): qty 15

## 2018-07-18 MED ORDER — SODIUM CHLORIDE 0.9 % IV SOLN
1.0000 g | Freq: Two times a day (BID) | INTRAVENOUS | Status: DC
  Administered 2018-07-19 – 2018-07-23 (×9): 1 g via INTRAVENOUS
  Filled 2018-07-18 (×10): qty 1

## 2018-07-18 MED ORDER — SODIUM CHLORIDE 0.9 % IV SOLN: 2.0000 g | Freq: Once | INTRAVENOUS | Status: DC

## 2018-07-18 MED ORDER — SODIUM CHLORIDE 0.9 % IV SOLN
INTRAVENOUS | Status: DC
  Administered 2018-07-18 – 2018-07-19 (×4): via INTRAVENOUS
  Administered 2018-07-20: 1000 mL via INTRAVENOUS
  Administered 2018-07-20 – 2018-07-22 (×6): via INTRAVENOUS

## 2018-07-18 MED ORDER — FENTANYL CITRATE (PF) 100 MCG/2ML IJ SOLN
50.0000 ug | Freq: Once | INTRAMUSCULAR | Status: AC
  Administered 2018-07-18: 50 ug via INTRAVENOUS
  Filled 2018-07-18: qty 2

## 2018-07-18 MED ORDER — SODIUM CHLORIDE 0.9 % IV BOLUS
1000.0000 mL | Freq: Once | INTRAVENOUS | Status: AC
  Administered 2018-07-18: 1000 mL via INTRAVENOUS

## 2018-07-18 MED ORDER — DORZOLAMIDE HCL 2 % OP SOLN
1.0000 [drp] | Freq: Two times a day (BID) | OPHTHALMIC | Status: DC
  Administered 2018-07-19 – 2018-07-25 (×13): 1 [drp] via OPHTHALMIC
  Filled 2018-07-18: qty 10

## 2018-07-18 MED ORDER — FAMOTIDINE 20 MG PO TABS
20.0000 mg | ORAL_TABLET | Freq: Two times a day (BID) | ORAL | Status: DC
  Administered 2018-07-18 – 2018-07-25 (×14): 20 mg via ORAL
  Filled 2018-07-18 (×14): qty 1

## 2018-07-18 MED ORDER — POTASSIUM CHLORIDE 10 MEQ/100ML IV SOLN
10.0000 meq | INTRAVENOUS | Status: AC
  Administered 2018-07-18 – 2018-07-19 (×2): 10 meq via INTRAVENOUS
  Filled 2018-07-18 (×2): qty 100

## 2018-07-18 MED ORDER — ATORVASTATIN CALCIUM 10 MG PO TABS
10.0000 mg | ORAL_TABLET | Freq: Every day | ORAL | Status: DC
  Administered 2018-07-19 – 2018-07-25 (×7): 10 mg via ORAL
  Filled 2018-07-18 (×7): qty 1

## 2018-07-18 MED ORDER — DIGOXIN 0.0625 MG HALF TABLET
0.0625 mg | ORAL_TABLET | Freq: Every morning | ORAL | Status: DC
  Administered 2018-07-19 – 2018-07-25 (×6): 0.0625 mg via ORAL
  Filled 2018-07-18 (×3): qty 1
  Filled 2018-07-18: qty 0.5
  Filled 2018-07-18 (×3): qty 1

## 2018-07-18 MED ORDER — ACETAMINOPHEN 500 MG PO TABS: 500.0000 mg | ORAL_TABLET | ORAL | Status: DC | PRN

## 2018-07-18 MED ORDER — TAMSULOSIN HCL 0.4 MG PO CAPS
0.4000 mg | ORAL_CAPSULE | Freq: Every evening | ORAL | Status: DC
  Administered 2018-07-18 – 2018-07-23 (×6): 0.4 mg via ORAL
  Filled 2018-07-18 (×7): qty 1

## 2018-07-18 NOTE — Consult Note (Signed)
Reason for Consult: Infected left heel. Referring Physician: Hospitalist  Steven Duffy is an 77 y.o. male.  HPI: The patient is a 77 year old male who has a complex medical history of having had a previous stroke.  He is essentially been wheelchair-bound over the last 7 years.  He does not stand or walk at this point.  He is cared for by his wife predominantly.  He has about a 51-monthhistory of a wound breakdown over that left heel.  He has been in wound center since that time.  He was admitted today with cellulitis and we were consulted to evaluate the wound and make decisions.  Past Medical History:  Diagnosis Date  . Alcohol dependence in remission ([place]   . Allergic rhinitis   . Anemia   . Anxiety    chronic  . Arthritis    osteoarthritis   . Atrial fibrillation ([place]   . Blind   . BPH (benign prostatic hypertrophy) with urinary obstruction   . Brachial neuritis or radiculitis   . Carotid bruit   . Colon polyps    hx of   . Coronary artery disease    non obstructive per cath 2005   . Depression   . Dermatitis   . Diabetes mellitus without complication ([place]    type II  . GERD (gastroesophageal reflux disease)   . Glaucoma   . Headache    hx of migraines   . Hematuria    hx of   . Hemorrhoids   . History of kidney stones   . Hyperlipidemia   . Hypertension   . Kidney stones    stage 3  . Lumbago   . Lumbar radiculopathy   . Neuropathy   . Neuropathy due to medical condition ([place]   . Nontraumatic intracranial hemorrhage ([place]   . Occasional tremors    right arm   . Pharyngitis    hx of   . Pneumonia    hx of legionnaires in 2003  . Presence of permanent cardiac pacemaker    [employer]   . Prostatitis   . Rotator cuff tear   . Sinus bradycardia    required pacer   . Sleep apnea    bipap  . Stroke ([facility]    left side weakness   . Ventricular tachycardia (HCC)    hx of   . Vitreous hemorrhage (HCC)     Past Surgical History:  Procedure Laterality  Date  . cataract surgery      left   . CIRCUMCISION    . COLONOSCOPY    . CYSTOSCOPY     multiple   . CYSTOSCOPY WITH RETROGRADE PYELOGRAM, URETEROSCOPY AND STENT PLACEMENT Right 07/04/2015   Procedure: RIGHT RETROGRADE PYELOGRAM,  RIGHT URETEROSCOPY ;  Surgeon: MKathie Rhodes MD;  Location: [facility];  Service: Urology;  Laterality: Right;  . CYSTOSCOPY/RETROGRADE/URETEROSCOPY/STONE EXTRACTION WITH BASKET Right 01/04/2017   Procedure: CYSTOSCOPY/RETROGRADE/URETEROSCOPY/HOLMIUM LASER/STONE EXTRACTION WITH BASKET/ DOUBLE J STENT;  Surgeon: MKathie Rhodes MD;  Location: [facility];  Service: Urology;  Laterality: Right;  . hemilaminectomy    . HOLMIUM LASER APPLICATION Right 76/23/7628  Procedure: HOLMIUM LASER LITHOTRIPSY AND STENT PLACEMENT ;  Surgeon: MKathie Rhodes MD;  Location: [facility];  Service: Urology;  Laterality: Right;  . INTRAOCULAR LENS INSERTION    . IR URETERAL STENT RIGHT NEW ACCESS W/O SEP NEPHROSTOMY CATH  10/07/2017  . IR URETERAL STENT RIGHT NEW ACCESS W/O SEP NEPHROSTOMY CATH  10/07/2017  . neck surgery in  1995 (neck broken)    . NEPHROLITHOTOMY Right 10/07/2017   Procedure: NEPHROLITHOTOMY PERCUTANEOUS;  Surgeon: Kathie Rhodes, MD;  Location: [facility];  Service: Urology;  Laterality: Right;  . placement of gastrostomy tube      hx of   . PP vitrectomy     . rhinologic surgery     . SHOULDER SURGERY    . TRACHEOSTOMY     hx of   . URETHROPLASTY    . VASECTOMY      No family history on file.  Social History:  reports that he quit smoking about 37 years ago. He has never used smokeless tobacco. He reports that he does not drink alcohol or use drugs.  Allergies:  Allergies  Allergen Reactions  . Duloxetine Nausea And Vomiting and Other (See Comments)    depression  . Iodine Swelling  . Lyrica [Pregabalin] Other (See Comments)    Depression, suicidal   . Shellfish-Derived Products Swelling  . Procaine Other (See Comments)    unspecified  . Ciprofloxacin Rash  .  Penicillins Rash    CHILDHOOD ALLERGY Has patient had a PCN reaction causing immediate rash, facial/tongue/throat swelling, SOB or lightheadedness with hypotension: YES Has patient had a PCN reaction causing severe rash involving mucus membranes or skin necrosis: No Has patient had a PCN reaction that required hospitalization NO Has patient had a PCN reaction occurring within the last 10 years: NO  If all of the above answers are "NO", then may proceed with Cephalosporin use.   . Tape Rash    NOT paper tape    Medications: I have reviewed the patient's current medications.  Results for orders placed or performed during the hospital encounter of 07/18/18 (from the past 48 hour(s))  Comprehensive metabolic panel     Status: Abnormal   Collection Time: 07/18/18  1:10 PM  Result Value Ref Range   Sodium 141 135 - 145 mmol/L   Potassium 3.3 (L) 3.5 - 5.1 mmol/L   Chloride 111 98 - 111 mmol/L   CO2 22 22 - 32 mmol/L   Glucose, Bld 156 (H) 70 - 99 mg/dL   BUN 28 (H) 8 - 23 mg/dL   Creatinine, Ser 1.58 (H) 0.61 - 1.24 mg/dL   Calcium 8.7 (L) 8.9 - 10.3 mg/dL   Total Protein 7.1 6.5 - 8.1 g/dL   Albumin 3.2 (L) 3.5 - 5.0 g/dL   AST 19 15 - 41 U/L   ALT 14 0 - 44 U/L   Alkaline Phosphatase 113 38 - 126 U/L   Total Bilirubin 0.4 0.3 - 1.2 mg/dL   GFR calc non Af Amer 41 (L) >60 mL/min   GFR calc Af Amer 47 (L) >60 mL/min    Comment: (NOTE) The eGFR has been calculated using the CKD EPI equation. This calculation has not been validated in all clinical situations. eGFR's persistently <60 mL/min signify possible Chronic Kidney Disease.    Anion gap 8 5 - 15    Comment: Performed at [facility], [place] [address] [place], [place] [address]  CBC with Differential     Status: None   Collection Time: 07/18/18  1:10 PM  Result Value Ref Range   WBC 9.4 4.0 - 10.5 K/uL   RBC 4.76 4.22 - 5.81 MIL/uL   Hemoglobin 13.7 13.0 - 17.0 g/dL   HCT 44.5 39.0 - 52.0 %   MCV 93.5  78.0 - 100.0 fL   MCH 28.8 26.0 - 34.0 pg  MCHC 30.8 30.0 - 36.0 g/dL   RDW 14.3 11.5 - 15.5 %   Platelets 273 150 - 400 K/uL   Neutrophils Relative % 76 %   Neutro Abs 7.1 1.7 - 7.7 K/uL   Lymphocytes Relative 14 %   Lymphs Abs 1.3 0.7 - 4.0 K/uL   Monocytes Relative 9 %   Monocytes Absolute 0.9 0.1 - 1.0 K/uL   Eosinophils Relative 1 %   Eosinophils Absolute 0.1 0.0 - 0.7 K/uL   Basophils Relative 0 %   Basophils Absolute 0.0 0.0 - 0.1 K/uL    Comment: Performed at [facility], [place] [address], [place], [place] [address]  Protime-INR     Status: Abnormal   Collection Time: 07/18/18  1:10 PM  Result Value Ref Range   Prothrombin Time 17.9 (H) 11.4 - 15.2 seconds   INR 1.49     Comment: Performed at [facility], [place] [address], [place], [place] [address]  I-Stat CG4 Lactic Acid, ED     Status: None   Collection Time: 07/18/18  2:17 PM  Result Value Ref Range   Lactic Acid, Venous 1.26 0.5 - 1.9 mmol/L    Dg Foot Complete Left  Result Date: 07/18/2018 CLINICAL DATA:  Pt sent form Wound Center for increased wound infection, weakness and low BP. Wife states that pt reports waking up today feeling like he has the flu and felt bad all over. Pt is currently on doxycycline for possible cellulitis. Paperwork from wound center states that pt having purulent drainage from left foot wound EXAM: LEFT FOOT - COMPLETE 3+ VIEW COMPARISON:  04/17/2018 FINDINGS: No fracture. Small area of loss of the normal white cortical line along the plantar margin of the calcaneal tuberosity consistent with osteomyelitis. No other evidence of osteomyelitis. Moderate loss joint space with subchondral sclerosis and small marginal osteophytes at the first metatarsophalangeal joint. Remaining joints normally spaced and aligned. Soft tissue wound is seen along the posterior margin of the he will adjacent to the calcaneal tuberosity. IMPRESSION: 1. Findings consistent with a small  focus of osteomyelitis along the plantar margin of the calcaneal tuberosity. No other evidence of osteomyelitis. No fracture or dislocation. Electronically Signed   By: Lajean Manes M.D.   On: 07/18/2018 15:14    ROS  ROS: I have reviewed the patient's review of systems thoroughly and there are no positive responses as relates to the HPI. Blood pressure 94/62, pulse 65, temperature 97.6 F (36.4 C), temperature source Oral, resp. rate 14, weight 66.2 kg, SpO2 100 %. Physical Exam Well-developed well-nourished patient in no acute distress. Alert and oriented x3 HEENT:within normal limits Cardiac: Regular rate and rhythm Pulmonary: Lungs clear to auscultation Abdomen: Soft and nontender.  Normal active bowel sounds  Musculoskeletal: Left heel: 3 x 3 cm wound with exposed bone centrally in the wound.  There is no dramatic drainage.  There is no surrounding significant erythema.) Assessment/Plan: 77 year old male with history of open wound over the left heel who has been in wound center for management.  He clearly has a wound that will not heal.//I have had a long discussion with the patient and his wife.  His options are to live with this wound and just do dressing changes and continue going back to the wound center.  Second option would be to do about a 12 inch incision from the center portion of the back of the leg down to the bottom of the heel.  I would resect  out the Achilles tendon as well as the majority of the calcaneus.  I would resect the open wound.  I would close this wound and use a compressive dressing.  I do think he would have a reasonable chance of healing that wound.  The third option would be to do a below-knee amputation.  Given that he is a wheelchair ambulator I think this is a very reasonable option.  I have discussed all these options with the patient and the wife and they will make some decision tomorrow.  I will plan on surgical intervention on Sunday morning.  I would like  to get him off of any blood thinners if he were on them Saturday evening.  The patient and his wife understand the risks of surgery including bleeding, infection, need for further surgery, and failure to heal.  They understand there is a slight chance of death and around the time of surgery.  I will await their decision about whether they want to proceed with surgery whether they would like to try to treat this conservatively.  Alta Corning 07/18/2018, 9:06 PM

## 2018-07-18 NOTE — ED Notes (Signed)
Pt has a lift pad under him. Staff uncomfortable attempting to assist him to stand and pivot

## 2018-07-18 NOTE — H&P (Signed)
History and Physical    Steven Duffy WNI:[id number] DOB: 1941-06-22 DOA: 07/18/2018   PCP: Patient, No Pcp Per   Patient coming from: [facility]   Chief Complaint: Left heal worsening pain and drainage   HPI: Steven Duffy is a 77 y.o. male with extensive medical history listed below, including history of CVA, status post ICH in 2012, with subsequent left hemiparesis, seizures, blindness, hypertension, hyperlipidemia, CAD, PAF, history of pacemaker on chronic anticoagulation, congestive heart failure, depression, chronic anxiety, remote alcohol dependency, history of BPH, diabetes with mild neuropathy, stage III CKD, iron deficiency anemia, GERD, sleep apnea on CPAP, hypothyroidism, as well as a history of left heel wound, brought to the emergency department, with increased pain and drainage from the left heel wound for the last 4 to 5 days.  History is obtained by chart, as the patient is to provide history at this time, he is only responsive to sound and touch, but he is unable to engage in conversation.  Per chart report, no apparent fever, vomiting, cough abdominal pain or auditory issues.  Chart also reports that the patient was initially "feeling like he was going to have the flu, feeling bad all over ".  Chart also states that as an outpatient, he was started on doxycycline for possible cellulitis, but despite this medication, purulent drainage from the left foot wound was still noted.  He had been seen at the wound care center today, and sent to the ED due to concern for possible sepsis.  He was also hypotensive, with a BP of 96/62 due to poor oral intake.   ED Course:  BP 112/73   Pulse 66   Temp 98.8 F (37.1 C) (Rectal)   Resp 16   Wt 66.2 kg   SpO2 100%   BMI 22.87 kg/m   Potassium 3.3, creatinine 1.58, white count normal at 9.4, lactic acid 1.26  cultures were drawn, Urine culture pending He received fluids 1 L, pain medication, antiemetics, and he also was given  Maxipime He is being admitted for the management of his left heel cellulitis.   Review of Systems: Other information cannot be obtained, at this time he is level 5 caveat.  Past Medical History:  Diagnosis Date  . Alcohol dependence in remission ([place])   . Allergic rhinitis   . Anemia   . Anxiety    chronic  . Arthritis    osteoarthritis   . Atrial fibrillation ([place])   . Blind   . BPH (benign prostatic hypertrophy) with urinary obstruction   . Brachial neuritis or radiculitis   . Carotid bruit   . Colon polyps    hx of   . Coronary artery disease    non obstructive per cath 2005   . Depression   . Dermatitis   . Diabetes mellitus without complication ([place])    type II  . GERD (gastroesophageal reflux disease)   . Glaucoma   . Headache    hx of migraines   . Hematuria    hx of   . Hemorrhoids   . History of kidney stones   . Hyperlipidemia   . Hypertension   . Kidney stones    stage 3  . Lumbago   . Lumbar radiculopathy   . Neuropathy   . Neuropathy due to medical condition ([place])   . Nontraumatic intracranial hemorrhage ([place])   . Occasional tremors    right arm   . Pharyngitis    hx of   .  Pneumonia    hx of legionnaires in 2003  . Presence of permanent cardiac pacemaker    [employer]   . Prostatitis   . Rotator cuff tear   . Sinus bradycardia    required pacer   . Sleep apnea    bipap  . Stroke [facility])    left side weakness   . Ventricular tachycardia (HCC)    hx of   . Vitreous hemorrhage (HCC)     Past Surgical History:  Procedure Laterality Date  . cataract surgery      left   . CIRCUMCISION    . COLONOSCOPY    . CYSTOSCOPY     multiple   . CYSTOSCOPY WITH RETROGRADE PYELOGRAM, URETEROSCOPY AND STENT PLACEMENT Right 07/04/2015   Procedure: RIGHT RETROGRADE PYELOGRAM,  RIGHT URETEROSCOPY ;  Surgeon: Kathie Rhodes, MD;  Location: [facility];  Service: Urology;  Laterality: Right;  . CYSTOSCOPY/RETROGRADE/URETEROSCOPY/STONE EXTRACTION WITH BASKET Right  01/04/2017   Procedure: CYSTOSCOPY/RETROGRADE/URETEROSCOPY/HOLMIUM LASER/STONE EXTRACTION WITH BASKET/ DOUBLE J STENT;  Surgeon: Kathie Rhodes, MD;  Location: [facility];  Service: Urology;  Laterality: Right;  . hemilaminectomy    . HOLMIUM LASER APPLICATION Right 1/61/0960   Procedure: HOLMIUM LASER LITHOTRIPSY AND STENT PLACEMENT ;  Surgeon: Kathie Rhodes, MD;  Location: [facility];  Service: Urology;  Laterality: Right;  . INTRAOCULAR LENS INSERTION    . IR URETERAL STENT RIGHT NEW ACCESS W/O SEP NEPHROSTOMY CATH  10/07/2017  . IR URETERAL STENT RIGHT NEW ACCESS W/O SEP NEPHROSTOMY CATH  10/07/2017  . neck surgery in 1995 (neck broken)    . NEPHROLITHOTOMY Right 10/07/2017   Procedure: NEPHROLITHOTOMY PERCUTANEOUS;  Surgeon: Kathie Rhodes, MD;  Location: [facility];  Service: Urology;  Laterality: Right;  . placement of gastrostomy tube      hx of   . PP vitrectomy     . rhinologic surgery     . SHOULDER SURGERY    . TRACHEOSTOMY     hx of   . URETHROPLASTY    . VASECTOMY      Social History Social History   Socioeconomic History  . Marital status: Married    Spouse name: Not on file  . Number of children: Not on file  . Years of education: Not on file  . Highest education level: Not on file  Occupational History  . Not on file  Social Needs  . Financial resource strain: Not on file  . Food insecurity:    Worry: Not on file    Inability: Not on file  . Transportation needs:    Medical: Not on file    Non-medical: Not on file  Tobacco Use  . Smoking status: Former Smoker    Last attempt to quit: 12/29/1980    Years since quitting: 37.5  . Smokeless tobacco: Never Used  Substance and Sexual Activity  . Alcohol use: No  . Drug use: No  . Sexual activity: Not on file  Lifestyle  . Physical activity:    Days per week: Not on file    Minutes per session: Not on file  . Stress: Not on file  Relationships  . Social connections:    Talks on phone: Not on file    Gets together:  Not on file    Attends religious service: Not on file    Active member of club or organization: Not on file    Attends meetings of clubs or organizations: Not on file    Relationship status: Not on file  .  Intimate partner violence:    Fear of current or ex partner: Not on file    Emotionally abused: Not on file    Physically abused: Not on file    Forced sexual activity: Not on file  Other Topics Concern  . Not on file  Social History Narrative  . Not on file     Allergies  Allergen Reactions  . Duloxetine Nausea And Vomiting and Other (See Comments)    depression  . Iodine Swelling  . Lyrica [Pregabalin] Other (See Comments)    Depression, suicidal   . Shellfish-Derived Products Swelling  . Procaine Other (See Comments)    unspecified  . Ciprofloxacin Rash  . Penicillins Rash    CHILDHOOD ALLERGY Has patient had a PCN reaction causing immediate rash, facial/tongue/throat swelling, SOB or lightheadedness with hypotension: YES Has patient had a PCN reaction causing severe rash involving mucus membranes or skin necrosis: No Has patient had a PCN reaction that required hospitalization NO Has patient had a PCN reaction occurring within the last 10 years: NO  If all of the above answers are "NO", then may proceed with Cephalosporin use.   . Tape Rash    NOT paper tape    No family history on file.     Prior to Admission medications   Medication Sig Start Date End Date Taking? Authorizing Provider  acetaminophen (TYLENOL) 500 MG tablet Take 500-1,000 mg by mouth every 4 (four) hours as needed for mild pain.     [provider]  acetaZOLAMIDE (DIAMOX) 250 MG tablet Take 250 mg by mouth 3 (three) times daily.    [provider]  atorvastatin (LIPITOR) 20 MG tablet Take 20 mg by mouth at bedtime.     [provider]  bimatoprost (LUMIGAN) 0.01 % SOLN Place 1 drop into the right eye at bedtime as needed.  10/19/14   [provider]    buPROPion (WELLBUTRIN XL) 150 MG 24 hr tablet Take 150 mg by mouth every morning.     [provider]  cholecalciferol (VITAMIN D) 1000 units tablet Take 1,000 Units by mouth every morning.     [provider]  dabigatran (PRADAXA) 150 MG CAPS capsule Take 150 mg by mouth 2 (two) times daily.    [provider]  digoxin (LANOXIN) 0.125 MG tablet Take 0.0625 mg by mouth every morning.     [provider]  diltiazem (DILACOR XR) 180 MG 24 hr capsule Take 180 mg by mouth every morning.     [provider]  diphenhydrAMINE (SOMINEX) 25 MG tablet Take 25 mg by mouth once. Take 2 capsules by mouth 1 hour prior to surgery 10/07/17 10/07/17  [provider]  docusate sodium (COLACE) 100 MG capsule Take 100 mg by mouth daily as needed for mild constipation.     [provider]  dorzolamidel-timolol (COSOPT) 22.3-6.8 MG/ML SOLN ophthalmic solution Place 1 drop into the right eye 2 (two) times daily. For glaucoma    [provider]  escitalopram (LEXAPRO) 20 MG tablet Take 20 mg by mouth every morning.    [provider]  finasteride (PROSCAR) 5 MG tablet Take 5 mg by mouth every morning.     [provider]  gabapentin (NEURONTIN) 300 MG capsule Take 300 mg by mouth 2 (two) times daily.     [provider]  HYDROcodone-acetaminophen (NORCO) 10-325 MG tablet Take 1-2 tablets by mouth every 4 (four) hours as needed for moderate pain. Maximum dose  per 24 hours - 8 pills 10/08/17   Kathie Rhodes, MD  magnesium oxide (MAG-OX) 400 MG tablet Take 400 mg by mouth every morning.     [provider]  metoprolol tartrate (LOPRESSOR) 25 MG tablet Take 12.5 mg by mouth 2 (two) times daily.    [provider]  pilocarpine (PILOCAR) 1 % ophthalmic solution Place 2 drops into the right eye 4 (four) times daily.  10/19/14   [provider]  Polyethylene Glycol 3350 (MIRALAX PO) Take 17 g by mouth daily  as needed (constipation).    [provider]  predniSONE (DELTASONE) 50 MG tablet Take 50 mg by mouth once. Pt to take 1 tab 13 hours, 7 hours and 1 hour before right percutaneous nephrostolithotomy as directed    [provider]  ranitidine (ZANTAC) 150 MG tablet Take 150 mg by mouth at bedtime.    [provider]  Skin Protectants, Misc. (PERIGUARD) OINT Apply 1 application topically daily as needed. TO AFFECTED SITE (FOR RASH)    [provider]  tamsulosin (FLOMAX) 0.4 MG CAPS capsule Take 0.4 mg by mouth every evening. FOR URINARY SYMPTOMS    [provider]  ferrous sulfate 325 (65 FE) MG tablet Take 325 mg by mouth.  10/03/17  [provider]     Physical Exam:  Vitals:   07/18/18 1530 07/18/18 1600 07/18/18 1630 07/18/18 1657  BP: 108/69 115/72 112/73   Pulse: 65 65 66   Resp: (!) 22 15 16    Temp:      TempSrc:      SpO2: 100% 100% 100%   Weight:    66.2 kg   Constitutional: NAD, calm, chronically ill appearing  eyes: Patient has known glaucoma, legally blind.  Lids and conjunctivae normal ENMT: Mucous membranes are dry without exudate or lesions  Neck: normal, supple, no masses, no thyromegaly Respiratory: clear to auscultation bilaterally, no wheezing, no crackles. Normal respiratory effort  Cardiovascular: Regular rate and rhythm,  murmur, rubs or gallops. No extremity edema. 2+ pedal pulses. No carotid bruits.  Abdomen: Soft, no apparent tender, No hepatosplenomegaly. Bowel sounds positive.  Musculoskeletal: Left heel wound is noted to have purulent drainage, appears very tender to palpation, and possible bone exposure is seen.  Area measures about 5 to 6 cm, possibly about 1-1/2 to 2 cm in depth Skin: no jaundice, No lesions.  Neurologic: Sensation intact  Strength equal in all extremities Psychiatric:   Alert and oriented x 3. Normal mood.     Labs on Admission: I have personally reviewed following labs and imaging  studies  CBC: Recent Labs  Lab 07/18/18 1310  WBC 9.4  NEUTROABS 7.1  HGB 13.7  HCT 44.5  MCV 93.5  PLT 423    Basic Metabolic Panel: Recent Labs  Lab 07/18/18 1310  NA 141  K 3.3*  CL 111  CO2 22  GLUCOSE 156*  BUN 28*  CREATININE 1.58*  CALCIUM 8.7*    GFR: CrCl cannot be calculated (Unknown ideal weight.).  Liver Function Tests: Recent Labs  Lab 07/18/18 1310  AST 19  ALT 14  ALKPHOS 113  BILITOT 0.4  PROT 7.1  ALBUMIN 3.2*   No results for input(s): LIPASE, AMYLASE in the last 168 hours. No results for input(s): AMMONIA in the last 168 hours.  Coagulation Profile: Recent Labs  Lab 07/18/18 1310  INR 1.49    Cardiac Enzymes: No results for input(s): CKTOTAL, CKMB, CKMBINDEX, TROPONINI in the last 168 hours.  BNP (last 3 results) No results for input(s): PROBNP in the last 8760 hours.  HbA1C: No results for input(s): HGBA1C in the last 72 hours.  CBG: No results for input(s): GLUCAP in the last 168 hours.  Lipid Profile: No results for input(s): CHOL, HDL, LDLCALC, TRIG, CHOLHDL, LDLDIRECT in the last 72 hours.  Thyroid Function Tests: No results for input(s): TSH, T4TOTAL, FREET4, T3FREE, THYROIDAB in the last 72 hours.  Anemia Panel: No results for input(s): VITAMINB12, FOLATE, FERRITIN, TIBC, IRON, RETICCTPCT in the last 72 hours.  Urine analysis:    Component Value Date/Time   COLORURINE STRAW (A) 02/21/2017 2230   APPEARANCEUR CLEAR 02/21/2017 2230   LABSPEC 1.004 (L) 02/21/2017 2230   PHURINE 8.0 02/21/2017 2230   GLUCOSEU NEGATIVE 02/21/2017 2230   HGBUR SMALL (A) 02/21/2017 2230   BILIRUBINUR NEGATIVE 02/21/2017 2230   KETONESUR NEGATIVE 02/21/2017 2230   PROTEINUR NEGATIVE 02/21/2017 2230   UROBILINOGEN 2.0 (H) 02/24/2011 1719   NITRITE NEGATIVE 02/21/2017 2230   LEUKOCYTESUR NEGATIVE 02/21/2017 2230    Sepsis Labs: @LABRCNTIP (procalcitonin:4,lacticidven:4) )No results found for this or any previous visit (from the  past 240 hour(s)).   Radiological Exams on Admission: Dg Foot Complete Left  Result Date: 07/18/2018 CLINICAL DATA:  Pt sent form Wound Center for increased wound infection, weakness and low BP. Wife states that pt reports waking up today feeling like he has the flu and felt bad all over. Pt is currently on doxycycline for possible cellulitis. Paperwork from wound center states that pt having purulent drainage from left foot wound EXAM: LEFT FOOT - COMPLETE 3+ VIEW COMPARISON:  04/17/2018 FINDINGS: No fracture. Small area of loss of the normal white cortical line along the plantar margin of the calcaneal tuberosity consistent with osteomyelitis. No other evidence of osteomyelitis. Moderate loss joint space with subchondral sclerosis and small marginal osteophytes at the first metatarsophalangeal joint. Remaining joints normally spaced and aligned. Soft tissue wound is seen along the posterior margin of the he will adjacent to the calcaneal tuberosity. IMPRESSION: 1. Findings consistent with a small focus of osteomyelitis along the plantar margin of the calcaneal tuberosity. No other evidence of osteomyelitis. No fracture or dislocation. Electronically Signed   By: Lajean Manes M.D.   On: 07/18/2018 15:14    EKG: Independently reviewed.  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  Patient was given IV vancomycin on presentation, as well as IV fluids white count is normal.   Patient is afebrile.  Initial lactic acid is 1.26.  Orthopedics was consulted, awaiting further recommendations Admit to MedSurg Cellulitis order set Bed rest  Continue vancomycin, but due to patient's allergy to penicillins, pharmacy recommends cefepime.  Await for orthopedic, to evaluate for possible surgical debridement. If concern for necrotizing fasciitis, the patient needs early antibiotics with Vanc/Zosyn and prompt surgical debridement. Wound culture IV fluids Unable to do MRI due to Pacemaker   Chronic kidney disease stage Current Cr is 1.58 Lab  Results  Component Value Date   CREATININE 1.58 (H) 07/18/2018   CREATININE 1.58 (H) 10/03/2017   CREATININE 1.76 (H) 02/24/2017   IVF Hold diuretics and ACEI Repeat BMET in am  Hold NSAIDS   Hyperlipidemia Continue home statins   Type II Diabetes Current blood sugar level is  156 Lab Results  Component Value Date   HGBA1C 6.9 (H) 10/03/2017   Hgb A1C  SSI  Continue Neurontin    Benign prostatic hypertrophy, no acute issues Continue Flomax    Hypertension, now with Hypotension due to por oral intake  BP (!) 85/59 (BP Location: Left Arm)   Pulse 66   Temp 98.8 F (37.1 C) (Rectal)   Resp 16   Wt 66.2 kg   SpO2 98%   BMI 22.87 kg/m   Depression Continue home Lexapro     R glaucoma , continue drops    Hypothyroidism: Continue home Synthroid  GERD, no acute symptoms Continue PPI    DVT prophylaxis: SCD Code Status: Full code    Family Communication:  Discussed with patient Disposition Plan: Expect patient to be discharged to home after condition improves Consults called:    Orthopedics per EDP Admission status: Inpatient MedSurg   Sharene Butters, PA-C [facility]   Amion text  [phone]   07/18/2018, 5:13 PM

## 2018-07-18 NOTE — Progress Notes (Signed)
harmacy Antibiotic Note, Scr  DELSIN COPEN is a 77 y.o. male admitted on 07/18/2018 with increased wound infection ,weakness and low BP.  Pt having purulent drainage from left foot wound.  Pharmacy has been consulted for vancomycin and cefeime for cellulitis.  Plan: Vancomycin 1gm IV x 1 then 750mg  Q24h (AUC 494.1. Scr 1.58) Cefepime 2gm IV x 1 then 1gm Q12h Follow renal function, cultures and clinical course      Temp (24hrs), Avg:98.2 F (36.8 C), Min:97.6 F (36.4 C), Max:98.8 F (37.1 C)  Recent Labs  Lab 07/18/18 1310 07/18/18 1417  WBC 9.4  --   CREATININE 1.58*  --   LATICACIDVEN  --  1.26    CrCl cannot be calculated (Unknown ideal weight.).    Allergies  Allergen Reactions  . Duloxetine Nausea And Vomiting and Other (See Comments)    depression  . Iodine Swelling  . Lyrica [Pregabalin] Other (See Comments)    Depression, suicidal   . Shellfish-Derived Products Swelling  . Procaine Other (See Comments)    unspecified  . Ciprofloxacin Rash  . Penicillins Rash    CHILDHOOD ALLERGY Has patient had a PCN reaction causing immediate rash, facial/tongue/throat swelling, SOB or lightheadedness with hypotension: YES Has patient had a PCN reaction causing severe rash involving mucus membranes or skin necrosis: No Has patient had a PCN reaction that required hospitalization NO Has patient had a PCN reaction occurring within the last 10 years: NO  If all of the above answers are "NO", then may proceed with Cephalosporin use.   . Tape Rash    NOT paper tape    Antimicrobials this admission: 8/9 vanc >> 8/9 cefepime >> Dose adjustments this admission:   Microbiology results: 8/9 BCx:    Thank you for allowing pharmacy to be a part of this patient's care.  Dolly Rias RPh 07/18/2018, 4:56 PM Pager [phone]

## 2018-07-18 NOTE — ED Provider Notes (Signed)
[facility] DEPT Provider Note   CSN: [id number] Arrival date & time: 07/18/18  1254     History   Chief Complaint Chief Complaint  Patient presents with  . Wound Infection  . Hypotension    HPI Steven Duffy is a 77 y.o. male.  HPI   Steven Duffy is a 77 y.o. male, with a history of DM, HTN, anemia, stroke, presenting to the ED with increased pain and drainage from left heel wound for the past few days. He was seen at the wound care center today and sent to the ED due to concern for possible sepsis. They noted patient's blood pressure was 96/62, 115/76 during last visit July 5. Patient notes increasing pain in the area of the wound, nausea, generalized weakness, and overall not feeling well for the past few days.  Wound care center notes green drainage from wound that was not previously present. He has been on doxycycline, but can not say how long he has been on it. He has had the wound for at least 5 months, originally from a bed sore.   Denies known fever, vomiting, cough, abdominal pain, or any other complaints.              Past Medical History:  Diagnosis Date  . Alcohol dependence in remission ([place])   . Allergic rhinitis   . Anemia   . Anxiety    chronic  . Arthritis    osteoarthritis   . Atrial fibrillation ([place])   . Blind   . BPH (benign prostatic hypertrophy) with urinary obstruction   . Brachial neuritis or radiculitis   . Carotid bruit   . Colon polyps    hx of   . Coronary artery disease    non obstructive per cath 2005   . Depression   . Dermatitis   . Diabetes mellitus without complication ([place])    type II  . GERD (gastroesophageal reflux disease)   . Glaucoma   . Headache    hx of migraines   . Hematuria    hx of   . Hemorrhoids   . History of kidney stones   . Hyperlipidemia   . Hypertension   . Kidney stones    stage 3  . Lumbago   . Lumbar radiculopathy   . Neuropathy   . 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 Nontraumatic intracranial hemorrhage ([place])   . Occasional tremors    right arm   . Pharyngitis    hx of   . Pneumonia    hx of legionnaires in 2003  . Presence of permanent cardiac pacemaker    [employer]   . Prostatitis   . Rotator cuff tear   . Sinus bradycardia    required pacer   . Sleep apnea    bipap  . Stroke [facility])    left side weakness   . Ventricular tachycardia (H[facility])    hx of   . Vitreous hemorrhage [facility])     Patient Active Problem List   Diagnosis Date Noted  . Cellulitis of left foot 07/18/2018  . Renal calculi 10/07/2017  . 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 Right lower lobe pneumonia ([place]) 02/21/2017  . Hypokalemia 02/21/2017  . Acute renal failure superimposed on stage 3 chronic kidney disease ([place]) 02/21/2017  . Leukocytosis 02/21/2017  . Anemia of chronic disease 02/21/2017  . Dyslipidemia associated with type 2 diabetes mellitus ([place]) 02/21/2017  . BPH (  benign prostatic hyperplasia) 02/21/2017  . Depression 02/21/2017  . Benign essential HTN 02/21/2017  . AF (paroxysmal atrial fibrillation) ([place]) 02/21/2017  . 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 Left hemiplegia ([place]) 09/26/2015  . CAD (coronary artery disease) 09/02/2015  . Obstructive sleep apnea syndrome 02/25/2012  . Pacemaker reprogramming/check 02/25/2012  . 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 cataract surgery      left   . CIRCUMCISION    . COLONOSCOPY    . CYSTOSCOPY     multiple   . CYSTOSCOPY WITH RETROGRADE PYELOGRAM, URETEROSCOPY AND STENT PLACEMENT Right 07/04/2015   Procedure: RIGHT RETROGRADE PYELOGRAM,  RIGHT URETEROSCOPY ;  Surgeon: Kathie Rhodes, MD;  Location: [facility];  Service: Urology;  Laterality: Right;  . CYSTOSCOPY/RETROGRADE/URETEROSCOPY/STONE EXTRACTION WITH BASKET Right 01/04/2017   Procedure:  CYSTOSCOPY/RETROGRADE/URETEROSCOPY/HOLMIUM LASER/STONE EXTRACTION WITH BASKET/ DOUBLE J STENT;  Surgeon: Kathie Rhodes, MD;  Location: [facility];  Service: Urology;  Laterality: Right;  . hemilaminectomy    . HOLMIUM LASER APPLICATION Right 0/96/0454   Procedure: HOLMIUM LASER LITHOTRIPSY AND STENT PLACEMENT ;  Surgeon: Kathie Rhodes, MD;  Location: [facility];  Service: Urology;  Laterality: Right;  . INTRAOCULAR LENS INSERTION    . IR URETERAL STENT RIGHT NEW ACCESS W/O SEP NEPHROSTOMY CATH  10/07/2017  . IR URETERAL STENT RIGHT NEW ACCESS W/O SEP NEPHROSTOMY CATH  10/07/2017  . neck surgery in 1995 (neck broken)    . NEPHROLITHOTOMY Right 10/07/2017   Procedure: NEPHROLITHOTOMY PERCUTANEOUS;  Surgeon: Kathie Rhodes, MD;  Location: [facility];  Service: Urology;  Laterality: Right;  . placement of gastrostomy tube      hx of   . PP vitrectomy     . rhinologic surgery     . SHOULDER SURGERY    . TRACHEOSTOMY     hx of   . URETHROPLASTY    . VASECTOMY          Home Medications    Prior to Admission medications   Medication Sig Start Date End Date Taking? Authorizing Provider  acetaZOLAMIDE (DIAMOX) 250 MG tablet Take 250 mg by mouth 3 (three) times daily.   Yes [provider]  atorvastatin (LIPITOR) 10 MG tablet Take 10 mg by mouth daily.   Yes [provider]  atropine 1 % ophthalmic solution Place 1 drop into the right eye daily.   Yes [provider]  buPROPion (WELLBUTRIN XL) 150 MG 24 hr tablet Take 150 mg by mouth every morning.    Yes [provider]  acetaminophen (TYLENOL) 500 MG tablet Take 500-1,000 mg by mouth every 4 (four) hours as needed for mild pain.     [provider]  bimatoprost (LUMIGAN) 0.01 % SOLN Place 1 drop into the right eye at bedtime as needed.  10/19/14   [provider]  cholecalciferol (VITAMIN D) 1000 units tablet Take 1,000 Units by mouth every morning.     [provider]  dabigatran (PRADAXA)  150 MG CAPS capsule Take 150 mg by mouth 2 (two) times daily.    [provider]  digoxin (LANOXIN) 0.125 MG tablet Take 0.0625 mg by mouth every morning.     [provider]  diltiazem (DILACOR XR) 180 MG 24 hr capsule Take 180 mg by mouth every morning.     [provider]  diphenhydrAMINE (SOMINEX) 25 MG tablet Take 25 mg by mouth once. Take 2  capsules by mouth 1 hour prior to surgery 10/07/17 10/07/17  [provider]  dorzolamidel-timolol (COSOPT) 22.3-6.8 MG/ML SOLN ophthalmic solution Place 1 drop into the right eye 2 (two) times daily. For glaucoma    [provider]  escitalopram (LEXAPRO) 20 MG tablet Take 20 mg by mouth every morning.    [provider]  finasteride (PROSCAR) 5 MG tablet Take 5 mg by mouth every morning.     [provider]  gabapentin (NEURONTIN) 300 MG capsule Take 300 mg by mouth 2 (two) times daily.     [provider]  HYDROcodone-acetaminophen (NORCO) 10-325 MG tablet Take 1-2 tablets by mouth every 4 (four) hours as needed for moderate pain. Maximum dose per 24 hours - 8 pills 10/08/17   Kathie Rhodes, MD  magnesium oxide (MAG-OX) 400 MG tablet Take 400 mg by mouth every morning.     [provider]  metoprolol tartrate (LOPRESSOR) 25 MG tablet Take 12.5 mg by mouth 2 (two) times daily.    [provider]  pilocarpine (PILOCAR) 1 % ophthalmic solution Place 2 drops into the right eye 4 (four) times daily.  10/19/14   [provider]  Polyethylene Glycol 3350 (MIRALAX PO) Take 17 g by mouth daily as needed (constipation).    [provider]  predniSONE (DELTASONE) 50 MG tablet Take 50 mg by mouth once. Pt to take 1 tab 13 hours, 7 hours and 1 hour before right percutaneous nephrostolithotomy as directed    [provider]  ranitidine (ZANTAC) 150 MG tablet Take 150 mg by mouth at bedtime.    [provider]  Skin Protectants, Misc. (PERIGUARD)  OINT Apply 1 application topically daily as needed. TO AFFECTED SITE (FOR RASH)    [provider]  tamsulosin (FLOMAX) 0.4 MG CAPS capsule Take 0.4 mg by mouth every evening. FOR URINARY SYMPTOMS    [provider]  ferrous sulfate 325 (65 FE) MG tablet Take 325 mg by mouth.  10/03/17  [provider]    Family History No family history on file.  Social History Social History   Tobacco Use  . Smoking status: Former Smoker    Last attempt to quit: 12/29/1980    Years since quitting: 37.5  . Smokeless tobacco: Never Used  Substance Use Topics  . Alcohol use: No  . Drug use: No     Allergies   Duloxetine; Iodine; Lyrica [pregabalin]; Shellfish-derived products; Procaine; Ciprofloxacin; Penicillins; and Tape   Review of Systems Review of Systems  Constitutional: Positive for fatigue. Negative for fever.  Respiratory: Negative for cough and shortness of breath.   Cardiovascular: Negative for chest pain.  Gastrointestinal: Positive for nausea. Negative for abdominal pain and vomiting.  Musculoskeletal: Positive for arthralgias.  Skin: Positive for wound.  All other systems reviewed and are negative.    Physical Exam Updated Vital Signs BP 94/65 (BP Location: Right Arm)   Pulse 65   Temp 97.6 F (36.4 C) (Oral)   Resp 18   SpO2 99%   Physical Exam  Constitutional: He appears well-developed and well-nourished. No distress.  HENT:  Head: Normocephalic and atraumatic.  Eyes: Conjunctivae are normal.  Neck: Neck supple.  Cardiovascular: Normal rate, regular rhythm, normal heart sounds and intact distal pulses.  Pulmonary/Chest: Effort normal and breath sounds normal. No respiratory distress.  Abdominal: Soft. There is no tenderness. There is no guarding.  Musculoskeletal: He exhibits edema and tenderness.  Lymphadenopathy:    He has no cervical  adenopathy.  Neurological: He is alert.  Skin: Skin is warm and dry. He is not diaphoretic.    Wound with what appears to be purulent drainage and bone exposure to the left heel.  Psychiatric: He has a normal mood and affect. His behavior is normal.  Nursing note and vitals reviewed.                ED Treatments / Results  Labs (all labs ordered are listed, but only abnormal results are displayed) Labs Reviewed  COMPREHENSIVE METABOLIC PANEL - Abnormal; Notable for the following components:      Result Value   Potassium 3.3 (*)    Glucose, Bld 156 (*)    BUN 28 (*)    Creatinine, Ser 1.58 (*)    Calcium 8.7 (*)    Albumin 3.2 (*)    GFR calc non Af Amer 41 (*)    GFR calc Af Amer 47 (*)    All other components within normal limits  PROTIME-INR - Abnormal; Notable for the following components:   Prothrombin Time 17.9 (*)    All other components within normal limits  CULTURE, BLOOD (ROUTINE X 2)  CULTURE, BLOOD (ROUTINE X 2)  CBC WITH DIFFERENTIAL/PLATELET  URINALYSIS, ROUTINE W REFLEX MICROSCOPIC  BASIC METABOLIC PANEL  CBC  I-STAT CG4 LACTIC ACID, ED    EKG None  Radiology Dg Foot Complete Left  Result Date: 07/18/2018 CLINICAL DATA:  Pt sent form Wound Center for increased wound infection, weakness and low BP. Wife states that pt reports waking up today feeling like he has the flu and felt bad all over. Pt is currently on doxycycline for possible cellulitis. Paperwork from wound center states that pt having purulent drainage from left foot wound EXAM: LEFT FOOT - COMPLETE 3+ VIEW COMPARISON:  04/17/2018 FINDINGS: No fracture. Small area of loss of the normal white cortical line along the plantar margin of the calcaneal tuberosity consistent with osteomyelitis. No other evidence of osteomyelitis. Moderate loss joint space with subchondral sclerosis and small marginal osteophytes at the first metatarsophalangeal joint. Remaining joints normally spaced and aligned. Soft tissue wound is seen along the posterior margin of the he will adjacent to the calcaneal  tuberosity. IMPRESSION: 1. Findings consistent with a small focus of osteomyelitis along the plantar margin of the calcaneal tuberosity. No other evidence of osteomyelitis. No fracture or dislocation. Electronically Signed   By: Lajean Manes M.D.   On: 07/18/2018 15:14    Procedures Procedures (including critical care time)  Medications Ordered in ED Medications  sodium chloride 0.9 % bolus 1,000 mL (1,000 mLs Intravenous New Bag/Given 07/18/18 1606)  vancomycin (VANCOCIN) IVPB 1000 mg/200 mL premix (1,000 mg Intravenous New Bag/Given 07/18/18 1633)  acetaminophen (TYLENOL) tablet 650 mg (has no administration in time range)    Or  acetaminophen (TYLENOL) suppository 650 mg (has no administration in time range)  HYDROcodone-acetaminophen (NORCO/VICODIN) 5-325 MG per tablet 1-2 tablet (has no administration in time range)  senna-docusate (Senokot-S) tablet 1 tablet (has no administration in time range)  bisacodyl (DULCOLAX) suppository 10 mg (has no administration in time range)  ondansetron (ZOFRAN) tablet 4 mg (has no administration in time range)    Or  ondansetron (ZOFRAN) injection 4 mg (has no administration in time range)  0.9 %  sodium chloride infusion ( Intravenous New Bag/Given 07/18/18 1634)  morphine 2 MG/ML injection 1 mg (has no administration in time range)  ceFEPIme (MAXIPIME) 2 g in sodium chloride 0.9 % 100 mL  IVPB (has no administration in time range)  ondansetron (ZOFRAN) injection 4 mg (4 mg Intravenous Given 07/18/18 1633)  fentaNYL (SUBLIMAZE) injection 50 mcg (50 mcg Intravenous Given 07/18/18 1634)     Initial Impression / Assessment and Plan / ED Course  I have reviewed the triage vital signs and the nursing notes.  Pertinent labs & imaging results that were available during my care of the patient were reviewed by me and considered in my medical decision making (see chart for details).  Clinical Course as of Jul 18 1720  Fri Jul 18, 2018  1456 Consistent with  previous values.  Creatinine(!): 1.58 [SJ]  1553 Spoke with Sherrilyn Rist, hospitalist team. Agrees to admit patient.   [SJ]  [id number] Spoke with Dr. Berenice Primas, orthopedics. States he will come see the patient this evening.    [SJ]    Clinical Course User Index [SJ] Tambria Pfannenstiel C, PA-C    Patient with wound to the left heel, seems to be worsening despite antibiotic therapy and regular wound care.  Evidence of osteomyelitis on x-ray.  Afebrile, not tachycardic, not tachypneic; doubt sepsis at this time.  Admission for IV antibiotics and further management.   Findings and plan of care discussed with Francine Graven, DO.   Vitals:   07/18/18 1530 07/18/18 1600 07/18/18 1630 07/18/18 1657  BP: 108/69 115/72 112/73   Pulse: 65 65 66   Resp: (!) 22 15 16    Temp:      TempSrc:      SpO2: 100% 100% 100%   Weight:    66.2 kg     Final Clinical Impressions(s) / ED Diagnoses   Final diagnoses:  Other acute osteomyelitis of left foot [facility])    ED Discharge Orders    None       Lorayne Bender, PA-C 07/18/18 [place], [place], DO 07/19/18 [phone]

## 2018-07-18 NOTE — ED Notes (Signed)
ED TO INPATIENT HANDOFF REPORT  Name/Age/Gender Steven Duffy 77 y.o. male  Code Status    Code Status Orders  (From admission, onward)         Start     Ordered   07/18/18 1620  Full code  Continuous     07/18/18 1622        Code Status History    Date Active Date Inactive Code Status Order ID Comments User Context   10/07/2017 1540 10/08/2017 1611 Full Code [id number]  Kathie Rhodes, MD Inpatient   02/21/2017 1722 02/25/2017 2219 Full Code [id number]  Robbie Lis, MD Inpatient    Advance Directive Documentation     Most Recent Value  Type of Advance Directive  Healthcare Power of Attorney, Living will  Pre-existing out of facility DNR order (yellow form or pink MOST form)  --  "MOST" Form in Place?  --      Home/SNF/Other Rehab  Chief Complaint left foot pain  Level of Care/Admitting Diagnosis ED Disposition    ED Disposition Condition [place]: [facility] [[id number]]  Level of Care: Med-Surg [16]  Diagnosis: Cellulitis of left foot [782956]  Admitting Physician: Patrecia Pour, EDWIN [[id number]]  Attending Physician: Patrecia Pour, EDWIN [[id number]]  Estimated length of stay: 3 - 4 days  Certification:: I certify this patient will need inpatient services for at least 2 midnights  PT Class (Do Not Modify): Inpatient [101]  PT Acc Code (Do Not Modify): Private [1]       Medical History Past Medical History:  Diagnosis Date  . Alcohol dependence in remission ([place])   . Allergic rhinitis   . Anemia   . Anxiety    chronic  . Arthritis    osteoarthritis   . Atrial fibrillation ([place])   . Blind   . BPH (benign prostatic hypertrophy) with urinary obstruction   . Brachial neuritis or radiculitis   . Carotid bruit   . Colon polyps    hx of   . Coronary artery disease    non obstructive per cath 2005   . Depression   . Dermatitis   . Diabetes mellitus without complication ([place])    type II  . GERD (gastroesophageal  reflux disease)   . Glaucoma   . Headache    hx of migraines   . Hematuria    hx of   . Hemorrhoids   . History of kidney stones   . Hyperlipidemia   . Hypertension   . Kidney stones    stage 3  . Lumbago   . Lumbar radiculopathy   . Neuropathy   . Neuropathy due to medical condition ([place])   . Nontraumatic intracranial hemorrhage ([place])   . Occasional tremors    right arm   . Pharyngitis    hx of   . Pneumonia    hx of legionnaires in 2003  . Presence of permanent cardiac pacemaker    [employer]   . Prostatitis   . Rotator cuff tear   . Sinus bradycardia    required pacer   . Sleep apnea    bipap  . Stroke [facility])    left side weakness   . Ventricular tachycardia (HCC)    hx of   . Vitreous hemorrhage (HCC)     Allergies Allergies  Allergen Reactions  . Duloxetine Nausea And Vomiting and Other (See Comments)    depression  . Iodine Swelling  . Lyrica [Pregabalin] Other (  See Comments)    Depression, suicidal   . Shellfish-Derived Products Swelling  . Procaine Other (See Comments)    unspecified  . Ciprofloxacin Rash  . Penicillins Rash    CHILDHOOD ALLERGY Has patient had a PCN reaction causing immediate rash, facial/tongue/throat swelling, SOB or lightheadedness with hypotension: YES Has patient had a PCN reaction causing severe rash involving mucus membranes or skin necrosis: No Has patient had a PCN reaction that required hospitalization NO Has patient had a PCN reaction occurring within the last 10 years: NO  If all of the above answers are "NO", then may proceed with Cephalosporin use.   . Tape Rash    NOT paper tape    IV Location/Drains/Wounds Patient Lines/Drains/Airways Status   Active Line/Drains/Airways    Name:   Placement date:   Placement time:   Site:   Days:   Peripheral IV 07/18/18 Right Antecubital   07/18/18    1407    Antecubital   less than 1   Ureteral Drain/Stent Right ureter 6 Fr.   07/04/15    1306    Right ureter   1110    Ureteral Drain/Stent Left ureter 6 Fr.   01/04/17    0833    Left ureter   560   Ureteral Drain/Stent Right ureter 6 Fr.   01/04/17    0835    Right ureter   560   Ureteral Drain/Stent Right ureter 5 Fr.   10/07/17    1041    Right ureter   284   Ureteral Drain/Stent Right ureter 6 Fr.   10/07/17    1235    Right ureter   284   Ureteral Drain/Stent Right ureter 6 Fr.   10/07/17    1258    Right ureter   284   External Urinary Catheter   --    --    --      Incision (Closed) 07/04/15 Perineum Other (Comment)   07/04/15    1309     1110   Incision (Closed) 10/07/17 Back Right   10/07/17    1309     284          Labs/Imaging Results for orders placed or performed during the hospital encounter of 07/18/18 (from the past 48 hour(s))  Comprehensive metabolic panel     Status: Abnormal   Collection Time: 07/18/18  1:10 PM  Result Value Ref Range   Sodium 141 135 - 145 mmol/L   Potassium 3.3 (L) 3.5 - 5.1 mmol/L   Chloride 111 98 - 111 mmol/L   CO2 22 22 - 32 mmol/L   Glucose, Bld 156 (H) 70 - 99 mg/dL   BUN 28 (H) 8 - 23 mg/dL   Creatinine, Ser 1.58 (H) 0.61 - 1.24 mg/dL   Calcium 8.7 (L) 8.9 - 10.3 mg/dL   Total Protein 7.1 6.5 - 8.1 g/dL   Albumin 3.2 (L) 3.5 - 5.0 g/dL   AST 19 15 - 41 U/L   ALT 14 0 - 44 U/L   Alkaline Phosphatase 113 38 - 126 U/L   Total Bilirubin 0.4 0.3 - 1.2 mg/dL   GFR calc non Af Amer 41 (L) >60 mL/min   GFR calc Af Amer 47 (L) >60 mL/min    Comment: (NOTE) The eGFR has been calculated using the CKD EPI equation. This calculation has not been validated in all clinical situations. eGFR's persistently <60 mL/min signify possible Chronic Kidney Disease.    Anion  gap 8 5 - 15    Comment: Performed at [facility], [place] [address] [place], [place] [address]  CBC with Differential     Status: None   Collection Time: 07/18/18  1:10 PM  Result Value Ref Range   WBC 9.4 4.0 - 10.5 K/uL   RBC 4.76 4.22 - 5.81 MIL/uL   Hemoglobin 13.7 13.0 -  17.0 g/dL   HCT 44.5 39.0 - 52.0 %   MCV 93.5 78.0 - 100.0 fL   MCH 28.8 26.0 - 34.0 pg   MCHC 30.8 30.0 - 36.0 g/dL   RDW 14.3 11.5 - 15.5 %   Platelets 273 150 - 400 K/uL   Neutrophils Relative % 76 %   Neutro Abs 7.1 1.7 - 7.7 K/uL   Lymphocytes Relative 14 %   Lymphs Abs 1.3 0.7 - 4.0 K/uL   Monocytes Relative 9 %   Monocytes Absolute 0.9 0.1 - 1.0 K/uL   Eosinophils Relative 1 %   Eosinophils Absolute 0.1 0.0 - 0.7 K/uL   Basophils Relative 0 %   Basophils Absolute 0.0 0.0 - 0.1 K/uL    Comment: Performed at [facility], [place] [address], [place], [place] [address]  Protime-INR     Status: Abnormal   Collection Time: 07/18/18  1:10 PM  Result Value Ref Range   Prothrombin Time 17.9 (H) 11.4 - 15.2 seconds   INR 1.49     Comment: Performed at [facility], [place] [address] [place], [place] [address]  I-Stat CG4 Lactic Acid, ED     Status: None   Collection Time: 07/18/18  2:17 PM  Result Value Ref Range   Lactic Acid, Venous 1.26 0.5 - 1.9 mmol/L   Dg Foot Complete Left  Result Date: 07/18/2018 CLINICAL DATA:  Pt sent form Wound Center for increased wound infection, weakness and low BP. Wife states that pt reports waking up today feeling like he has the flu and felt bad all over. Pt is currently on doxycycline for possible cellulitis. Paperwork from wound center states that pt having purulent drainage from left foot wound EXAM: LEFT FOOT - COMPLETE 3+ VIEW COMPARISON:  04/17/2018 FINDINGS: No fracture. Small area of loss of the normal white cortical line along the plantar margin of the calcaneal tuberosity consistent with osteomyelitis. No other evidence of osteomyelitis. Moderate loss joint space with subchondral sclerosis and small marginal osteophytes at the first metatarsophalangeal joint. Remaining joints normally spaced and aligned. Soft tissue wound is seen along the posterior margin of the he will adjacent to the calcaneal tuberosity.  IMPRESSION: 1. Findings consistent with a small focus of osteomyelitis along the plantar margin of the calcaneal tuberosity. No other evidence of osteomyelitis. No fracture or dislocation. Electronically Signed   By: Lajean Manes M.D.   On: 07/18/2018 15:14    Pending Labs Unresulted Labs (From admission, onward)    Start     Ordered   07/19/18 8372  Basic metabolic panel  Tomorrow morning,   R     07/18/18 1622   07/19/18 0500  CBC  Tomorrow morning,   R     07/18/18 1622   07/18/18 1621  Urinalysis, Routine w reflex microscopic  Once,   R     07/18/18 1622   07/18/18 1310  Culture, blood (Routine x 2)  BLOOD CULTURE X 2,   STAT     07/18/18 1309          Vitals/Pain Today's  Vitals   07/18/18 1530 07/18/18 1600 07/18/18 1625 07/18/18 1630  BP: 108/69 115/72  112/73  Pulse: 65 65  66  Resp: (!) '22 15  16  ' Temp:      TempSrc:      SpO2: 100% 100%  100%  PainSc:   7      Isolation Precautions No active isolations  Medications Medications  sodium chloride 0.9 % bolus 1,000 mL (1,000 mLs Intravenous New Bag/Given 07/18/18 1606)  vancomycin (VANCOCIN) IVPB 1000 mg/200 mL premix (1,000 mg Intravenous New Bag/Given 07/18/18 1633)  acetaminophen (TYLENOL) tablet 650 mg (has no administration in time range)    Or  acetaminophen (TYLENOL) suppository 650 mg (has no administration in time range)  HYDROcodone-acetaminophen (NORCO/VICODIN) 5-325 MG per tablet 1-2 tablet (has no administration in time range)  senna-docusate (Senokot-S) tablet 1 tablet (has no administration in time range)  bisacodyl (DULCOLAX) suppository 10 mg (has no administration in time range)  ondansetron (ZOFRAN) tablet 4 mg (has no administration in time range)    Or  ondansetron (ZOFRAN) injection 4 mg (has no administration in time range)  0.9 %  sodium chloride infusion ( Intravenous New Bag/Given 07/18/18 1634)  morphine 2 MG/ML injection 1 mg (has no administration in time range)  ceFEPIme (MAXIPIME) 2 g in  sodium chloride 0.9 % 100 mL IVPB (has no administration in time range)  ondansetron (ZOFRAN) injection 4 mg (4 mg Intravenous Given 07/18/18 1633)  fentaNYL (SUBLIMAZE) injection 50 mcg (50 mcg Intravenous Given 07/18/18 1634)    Mobility non-ambulatory

## 2018-07-18 NOTE — ED Triage Notes (Signed)
Pt sent form Wound Center for increased wound infection, weakness and low BP. Wife states that pt reports waking up today feeling like he has the flu and felt bad all over. Pt is currently on doxycycline for possible cellulitis. Paperwork from wound center states that pt having purulent drainage from left foot wound.  Wife reports that since the beginning of month pt hasnt been eating as much as should.

## 2018-07-19 DIAGNOSIS — M869 Osteomyelitis, unspecified: Secondary | ICD-10-CM

## 2018-07-19 LAB — CBC
HCT: 37.3 % — ABNORMAL LOW (ref 39.0–52.0)
Hemoglobin: 11.7 g/dL — ABNORMAL LOW (ref 13.0–17.0)
MCH: 29.3 pg (ref 26.0–34.0)
MCHC: 31.4 g/dL (ref 30.0–36.0)
MCV: 93.3 fL (ref 78.0–100.0)
PLATELETS: 235 10*3/uL (ref 150–400)
RBC: 4 MIL/uL — ABNORMAL LOW (ref 4.22–5.81)
RDW: 14.6 % (ref 11.5–15.5)
WBC: 7.4 10*3/uL (ref 4.0–10.5)

## 2018-07-19 LAB — GLUCOSE, CAPILLARY
GLUCOSE-CAPILLARY: 105 mg/dL — AB (ref 70–99)
GLUCOSE-CAPILLARY: 208 mg/dL — AB (ref 70–99)
Glucose-Capillary: 98 mg/dL (ref 70–99)
Glucose-Capillary: 96 mg/dL (ref 70–99)
Glucose-Capillary: 144 mg/dL — ABNORMAL HIGH (ref 70–99)

## 2018-07-19 LAB — BASIC METABOLIC PANEL
Anion gap: 5 (ref 5–15)
BUN: 23 mg/dL (ref 8–23)
CALCIUM: 8 mg/dL — AB (ref 8.9–10.3)
CO2: 20 mmol/L — ABNORMAL LOW (ref 22–32)
CREATININE: 1.33 mg/dL — AB (ref 0.61–1.24)
Chloride: 116 mmol/L — ABNORMAL HIGH (ref 98–111)
GFR calc Af Amer: 58 mL/min — ABNORMAL LOW (ref >60)
GFR, EST NON AFRICAN AMERICAN: 50 mL/min — AB (ref >60)
Glucose, Bld: 100 mg/dL — ABNORMAL HIGH (ref 70–99)
Potassium: 3.7 mmol/L (ref 3.5–5.1)
SODIUM: 141 mmol/L (ref 135–145)

## 2018-07-19 LAB — C-REACTIVE PROTEIN: CRP: 7.7 mg/dL — ABNORMAL HIGH (ref <1.0)

## 2018-07-19 LAB — SURGICAL PCR SCREEN
MRSA, PCR: NEGATIVE
STAPHYLOCOCCUS AUREUS: NEGATIVE

## 2018-07-19 LAB — HEMOGLOBIN A1C
HEMOGLOBIN A1C: 7.1 % — AB (ref 4.8–5.6)
MEAN PLASMA GLUCOSE: 157.07 mg/dL

## 2018-07-19 MED ORDER — CHLORHEXIDINE GLUCONATE 4 % EX LIQD
60.0000 mL | Freq: Once | CUTANEOUS | Status: AC
  Administered 2018-07-20: 4 via TOPICAL
  Filled 2018-07-19: qty 60

## 2018-07-19 MED ORDER — BOOST / RESOURCE BREEZE PO LIQD CUSTOM
1.0000 | Freq: Three times a day (TID) | ORAL | Status: DC
  Administered 2018-07-19 – 2018-07-23 (×7): 1 via ORAL
  Administered 2018-07-23: 237 via ORAL
  Administered 2018-07-25: 1 via ORAL

## 2018-07-19 MED ORDER — INSULIN ASPART 100 UNIT/ML ~~LOC~~ SOLN
0.0000 [IU] | Freq: Three times a day (TID) | SUBCUTANEOUS | Status: DC
  Administered 2018-07-19: 1 [IU] via SUBCUTANEOUS
  Administered 2018-07-20: 3 [IU] via SUBCUTANEOUS
  Administered 2018-07-21: 1 [IU] via SUBCUTANEOUS
  Administered 2018-07-21 – 2018-07-22 (×2): 2 [IU] via SUBCUTANEOUS
  Administered 2018-07-22 – 2018-07-23 (×4): 1 [IU] via SUBCUTANEOUS
  Administered 2018-07-24: 3 [IU] via SUBCUTANEOUS
  Administered 2018-07-24: 1 [IU] via SUBCUTANEOUS

## 2018-07-19 NOTE — Progress Notes (Signed)
Subjective: Patient is more awake and alert today.  He is well aware of the issues with his foot.  He is having some sensations of pain in the foot.   Objective: Vital signs in last 24 hours: Temp:  [97.6 F (36.4 C)-98.8 F (37.1 C)] 97.6 F (36.4 C) (08/10 0515) Pulse Rate:  [62-66] 65 (08/10 0515) Resp:  [14-22] 16 (08/10 0515) BP: (85-115)/(59-73) 105/63 (08/10 0515) SpO2:  [97 %-100 %] 100 % (08/10 0515) Weight:  [66.2 kg-70.8 kg] 70.8 kg (08/10 0709)  Intake/Output from previous day: 08/09 0701 - 08/10 0700 In: 1172.2 [I.V.:948.2; IV Piggyback:224] Out: -  Intake/Output this shift: Total I/O In: -  Out: 550 [Urine:550]  Recent Labs    07/18/18 1310 07/19/18 0528  HGB 13.7 11.7*   Recent Labs    07/18/18 1310 07/19/18 0528  WBC 9.4 7.4  RBC 4.76 4.00*  HCT 44.5 37.3*  PLT 273 235   Recent Labs    07/18/18 1310 07/19/18 0528  NA 141 141  K 3.3* 3.7  CL 111 116*  CO2 22 20*  BUN 28* 23  CREATININE 1.58* 1.33*  GLUCOSE 156* 100*  CALCIUM 8.7* 8.0*   Recent Labs    07/18/18 1310  INR 1.49    ABD soft Intact pulses distally Improved cellulitis today.  Wound is open with bone in the wound.      Assessment/Plan: 77 year old male with open wound of the heel with exposed bone currently on IV antibiotic therapy.//I long discussion with he and his wife and after discussion with the patient today I think he wants to undergo heel debridement.  This will be an extensive debridement of Achilles tendon and essentially resection of the calcaneus.  This should allow [place] to excise the wound and get a good leading tissue to heal.  He is certainly aware of the wrist that it does not go on to heal in which case he would need below-knee amputation.  I discussed below-knee amputation with he and his wife and they are currently not wanting to do this but certainly if they change their mind that would not be an unreasonable treatment option.   Alta Corning 07/19/2018,  8:46 AM

## 2018-07-19 NOTE — Progress Notes (Signed)
PROGRESS NOTE [facility]   Steven Duffy   HUT:[id number] DOB: 1941-10-18  DOA: 07/18/2018 PCP: Patient, No Pcp Per   Brief Narrative:  Steven Duffy 77 year old male with extensive medical history as listed below in APP note.  Patient was [facility] from [facility], due to left heel wound which have been worsening with pain and drainage.  Patient has been seen by wound care and been treated with antibiotics however wound continues to worsen.  Today at wound care evaluation patient was noted to be hypotensive, stated increasing pain and generalized weakness, wound care provider not this increase overall in green drainage not seen previously.  Patient was sent to the emergency department for evaluation of sepsis.  Upon ED evaluation patient was found to be hypotensive, lethargic, left heel wound with purulent drainage.  X-ray shows evidence of osteomyelitis.  Creatinine 1.5 at baseline.  Patient was admitted with working diagnosis of osteomyelitis  Subjective: Patient seen and examined, he seems more awake and interactive today.  Wife at bedside.  Feel better than yesterday.  No acute events overnight.  Orthopedic recommending surgical management.  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  Agree with empiric antibiotic vancomycin  and cefepime for now.  Orthopedic recommendations appreciated, recommendation for antibiotic therapy versus deep debridement versus BKA.  Patient opted for deep debridement and plan for surgical treatment in a.m.  Hypertension BP remains soft, continue to hold antihypertensive medication.  Continue IV fluids. Monitor BP closely  CKD stage III Stable continue to monitor Avoid hypotension and nephrotoxic agent  Type 2 diabetes mellitus Not on medication, A1c 7.7, will start SSI for now. Monitor CBGs  DVT prophylaxis: SCD's  Code Status: Full Code  Family Communication: Wife at bedside Disposition Plan: Back to pace upon recovery of surgical procedure   Consultants:   Orthopedic surgery   Procedures:   None   Antimicrobials: Anti-infectives (From admission, onward)   Start     Dose/Rate Route Frequency Ordered Stop   07/19/18 1600  vancomycin (VANCOCIN) IVPB 750 mg/150 ml premix     750 mg 150 mL/hr over 60 Minutes Intravenous Every 24 hours 07/18/18 1713     07/19/18 0600  ceFEPIme (MAXIPIME) 1 g in sodium chloride 0.9 % 100 mL IVPB     1 g 200 mL/hr over 30 Minutes Intravenous Every 12 hours 07/18/18 1816     07/18/18 1830  ceFEPIme (MAXIPIME) 2 g in sodium chloride 0.9 % 100 mL IVPB     2 g 200 mL/hr over 30 Minutes Intravenous  Once 07/18/18 1816 07/18/18 2148   07/18/18 1700  ceFEPIme (MAXIPIME) 2 g in sodium chloride 0.9 % 100 mL IVPB  Status:  Discontinued     2 g 200 mL/hr over 30 Minutes Intravenous  Once 07/18/18 1647 07/18/18 1816   07/18/18 1600  vancomycin (VANCOCIN) IVPB  1000 mg/200 mL premix     1,000 mg 200 mL/hr over 60 Minutes Intravenous  Once 07/18/18 1552 07/18/18 1733          Objective: Vitals:   07/19/18 0515 07/19/18 0709 07/19/18 1137 07/19/18 1249  BP: 105/63  109/61 (!) 88/51  Pulse: 65  64 65  Resp: 16   14  Temp: 97.6 F (36.4 C)  98 F (36.7 C) 97.8 F (36.6 C)  TempSrc: Oral  Oral Oral  SpO2: 100%   96%  Weight:  70.8 kg       Intake/Output Summary (Last 24 hours) at 07/19/2018 1650 Last data filed at 07/19/2018 0900 Gross per 24 hour  Intake 1172.16 ml  Output 950 ml  Net 222.16 ml   Filed Weights   07/18/18 1657 07/19/18 0709  Weight: 66.2 kg 70.8 kg    Examination:  General exam: Sleepy but responsive  Respiratory system: Clear to auscultation. No wheezes,crackle or rhonchi Cardiovascular system: S1 & S2 heard, RRR. No JVD, murmurs, rubs or gallops Gastrointestinal system: Abdomen is nondistended, soft and nontender.  Central nervous system: Alert and oriented. Left side hemiparesis. Extremities: Flaccid Skin: Left heal wound with yellow discharge, surrounding erythema improved  Psychiatry:  Mood & affect appropriate.    Data Reviewed: I have personally reviewed following labs and imaging studies  CBC: Recent Labs  Lab 07/18/18 1310 07/19/18 0528  WBC 9.4 7.4  NEUTROABS 7.1  --   HGB 13.7 11.7*  HCT 44.5 37.3*  MCV 93.5 93.3  PLT 273 818   Basic Metabolic Panel: Recent Labs  Lab 07/18/18 1310 07/19/18 0528  NA 141 141  K 3.3* 3.7  CL 111 116*  CO2 22 20*  GLUCOSE 156* 100*  BUN 28* 23  CREATININE 1.58* 1.33*  CALCIUM 8.7* 8.0*   GFR: CrCl cannot be calculated (Unknown ideal weight.). Liver Function Tests: Recent Labs  Lab 07/18/18 1310  AST 19  ALT 14  ALKPHOS 113  BILITOT 0.4  PROT 7.1  ALBUMIN 3.2*   No results for input(s): LIPASE, AMYLASE in the last 168 hours. No results for input(s): AMMONIA in the last 168 hours. Coagulation Profile: Recent Labs  Lab 07/18/18 1310  INR 1.49   Cardiac Enzymes: No results for input(s): CKTOTAL, CKMB, CKMBINDEX, TROPONINI in the last 168 hours. BNP (last 3 results) No results for input(s): PROBNP in the last 8760 hours. HbA1C: Recent Labs    07/19/18 0528  HGBA1C 7.1*   CBG: Recent Labs  Lab 07/18/18 2126 07/19/18 0742 07/19/18 1141  GLUCAP 98 105* 96   Lipid Profile: No results for input(s): CHOL, HDL,  LDLCALC, TRIG, CHOLHDL, LDLDIRECT in the last 72 hours. Thyroid Function Tests: No results for input(s): TSH, T4TOTAL, FREET4, T3FREE, THYROIDAB in the last 72 hours. Anemia Panel: No results for input(s): VITAMINB12, FOLATE, FERRITIN, TIBC, IRON, RETICCTPCT in the last 72 hours. Sepsis Labs: Recent Labs  Lab 07/18/18 1417  LATICACIDVEN 1.26    Recent Results (from the past 240 hour(s))  Culture, blood (Routine x 2)     Status: None (Preliminary result)   Collection Time: 07/18/18  1:10 PM  Result Value Ref Range Status   Specimen Description   Final    BLOOD RIGHT ANTECUBITAL Performed at [facility] [address], [place], [place] [address]    Special Requests   Final    BOTTLES DRAWN AEROBIC AND ANAEROBIC Blood Culture adequate volume Performed at [facility]  [address] [place], [place] [address]    Culture   Final    NO GROWTH < 24 HOURS Performed at [facility], [place] [address] [place], [place] [address]    Report Status PENDING  Incomplete  Culture, blood (Routine x 2)     Status: None (Preliminary result)   Collection Time: 07/18/18  4:16 PM  Result Value Ref Range Status   Specimen Description   Final    BLOOD LEFT ANTECUBITAL Performed at [facility] [address] [place], [place] [address]    Special Requests   Final    BOTTLES DRAWN AEROBIC AND ANAEROBIC Blood Culture adequate volume Performed at [facility] [address] [place], [place] [address]    Culture   Final    NO GROWTH < 24 HOURS Performed at [facility] [address], [place], [place] [address]    Report Status PENDING  Incomplete      Radiology Studies: Dg Foot Complete Left  Result Date: 07/18/2018 CLINICAL DATA:  Pt sent form Wound Center for increased wound infection, weakness and low BP. Wife states that pt reports waking up today feeling like he has the flu and felt bad all over.  Pt is currently on doxycycline for possible cellulitis. Paperwork from wound center states that pt having purulent drainage from left foot wound EXAM: LEFT FOOT - COMPLETE 3+ VIEW COMPARISON:  04/17/2018 FINDINGS: No fracture. Small area of loss of the normal white cortical line along the plantar margin of the calcaneal tuberosity consistent with osteomyelitis. No other evidence of osteomyelitis. Moderate loss joint space with subchondral sclerosis and small marginal osteophytes at the first metatarsophalangeal joint. Remaining joints normally spaced and aligned. Soft tissue wound is seen along the posterior margin of the he will adjacent to the calcaneal tuberosity. IMPRESSION: 1. Findings consistent with a small focus of osteomyelitis along the plantar margin of the calcaneal tuberosity. No other evidence of osteomyelitis. No fracture or dislocation. Electronically Signed   By: Lajean Manes M.D.   On: 07/18/2018 15:14      Scheduled Meds: . atorvastatin  10 mg Oral Daily  . atropine  1 drop Right Eye Daily  . buPROPion  150 mg Oral q morning - 10a  . digoxin  0.0625 mg Oral q morning - 10a  . diltiazem  180 mg Oral q morning - 10a  . dorzolamide  1 drop Right Eye BID   And  . timolol  1 drop Right Eye BID  . escitalopram  20 mg Oral q morning - 10a  . famotidine  20 mg Oral BID  . feeding supplement  1 Container Oral TID WC  . furosemide  40 mg Oral Daily  . gabapentin  300 mg Oral BID  . latanoprost  1 drop Right Eye QHS  . levothyroxine  37.5 mcg Oral QAC breakfast  . pilocarpine  2 drop Right Eye QID  . tamsulosin  0.4 mg Oral QPM   Continuous Infusions: . sodium chloride 100 mL/hr at 07/19/18 1035  . ceFEPime (MAXIPIME) IV 1 g (07/19/18 0644)  . vancomycin       LOS: 1 day   Time spent: Total of 25 minutes spent with pt, greater than 50% of which was spent in discussion of  treatment, counseling and coordination of care  Chipper Oman, MD Pager: Text Page via [email or web address]    If 7PM-7AM, please contact night-coverage [email or web address] 07/19/2018, 4:50 PM   Note - This  record has been created using [employer]. Chart creation errors have been sought, but may not always have been located. Such creation errors do not reflect on the standard of medical care.

## 2018-07-19 NOTE — Anesthesia Preprocedure Evaluation (Addendum)
Anesthesia Evaluation  Patient identified by MRN, date of birth, ID band Patient awake    Reviewed: Allergy & Precautions, NPO status , Patient's Chart, lab work & pertinent test results  Airway Mallampati: II  TM Distance: >3 FB Neck ROM: Full    Dental no notable dental hx. (+) Teeth Intact, Dental Advisory Given   Pulmonary sleep apnea and Continuous Positive Airway Pressure Ventilation , former smoker,    Pulmonary exam normal breath sounds clear to auscultation       Cardiovascular hypertension, + CAD  Normal cardiovascular exam+ pacemaker  Rhythm:Regular Rate:Normal  06/19/2018 Echo - Left ventricle: The cavity size was normal. Wall thickness was   increased in a pattern of moderate LVH. Systolic function was   normal. The estimated ejection fraction was in the range of 50%   to 55%. Wall motion was normal; there were no regional wall   motion abnormalities. The study is not technically sufficient to   allow evaluation of LV diastolic function.   Neuro/Psych Seizures -,  L hemiparesis  Neuromuscular disease CVA    GI/Hepatic Neg liver ROS, GERD  ,  Endo/Other  diabetes, Type 2  Renal/GU Renal InsufficiencyRenal disease     Musculoskeletal  (+) Arthritis ,   Abdominal   Peds  Hematology  (+) anemia ,   Anesthesia Other Findings   Reproductive/Obstetrics                             Lab Results  Component Value Date   CREATININE 1.33 (H) 07/19/2018   BUN 23 07/19/2018   NA 141 07/19/2018   K 3.7 07/19/2018   CL 116 (H) 07/19/2018   CO2 20 (L) 07/19/2018    Lab Results  Component Value Date   WBC 7.4 07/19/2018   HGB 11.7 (L) 07/19/2018   HCT 37.3 (L) 07/19/2018   MCV 93.3 07/19/2018   PLT 235 07/19/2018    Anesthesia Physical Anesthesia Plan  ASA: III  Anesthesia Plan: Regional and General   Post-op Pain Management:    Induction: Intravenous  PONV Risk Score and  Plan: 3 and Treatment may vary due to age or medical condition  Airway Management Planned: LMA  Additional Equipment:   Intra-op Plan:   Post-operative Plan:   Informed Consent: I have reviewed the patients History and Physical, chart, labs and discussed the procedure including the risks, benefits and alternatives for the proposed anesthesia with the patient or authorized representative who has indicated his/her understanding and acceptance.     Plan Discussed with: CRNA  Anesthesia Plan Comments:         Anesthesia Quick Evaluation

## 2018-07-20 ENCOUNTER — Encounter (HOSPITAL_COMMUNITY): Payer: Self-pay | Admitting: Registered Nurse

## 2018-07-20 ENCOUNTER — Encounter (HOSPITAL_COMMUNITY)
Admission: EM | Disposition: A | Discharge status: Skilled Nursing Facility | Payer: Self-pay | Source: Home / Self Care | Attending: Family Medicine

## 2018-07-20 ENCOUNTER — Inpatient Hospital Stay (HOSPITAL_COMMUNITY): Payer: Medicare (Managed Care) | Admitting: Anesthesiology

## 2018-07-20 HISTORY — PX: AMPUTATION: SHX166

## 2018-07-20 LAB — CBC WITH DIFFERENTIAL/PLATELET
BASOS ABS: 0 10*3/uL (ref 0.0–0.1)
Basophils Relative: 0 %
Eosinophils Absolute: 0.2 10*3/uL (ref 0.0–0.7)
Eosinophils Relative: 2 %
HEMATOCRIT: 34.5 % — AB (ref 39.0–52.0)
Hemoglobin: 10.6 g/dL — ABNORMAL LOW (ref 13.0–17.0)
LYMPHS PCT: 15 %
Lymphs Abs: 1.1 10*3/uL (ref 0.7–4.0)
MCH: 28.6 pg (ref 26.0–34.0)
MCHC: 30.7 g/dL (ref 30.0–36.0)
MCV: 93 fL (ref 78.0–100.0)
Monocytes Absolute: 0.7 10*3/uL (ref 0.1–1.0)
Monocytes Relative: 10 %
NEUTROS ABS: 5.4 10*3/uL (ref 1.7–7.7)
Neutrophils Relative %: 73 %
PLATELETS: 240 10*3/uL (ref 150–400)
RBC: 3.71 MIL/uL — AB (ref 4.22–5.81)
RDW: 14.6 % (ref 11.5–15.5)
WBC: 7.4 10*3/uL (ref 4.0–10.5)

## 2018-07-20 LAB — GLUCOSE, CAPILLARY
GLUCOSE-CAPILLARY: 220 mg/dL — AB (ref 70–99)
GLUCOSE-CAPILLARY: 132 mg/dL — AB (ref 70–99)
Glucose-Capillary: 99 mg/dL (ref 70–99)
Glucose-Capillary: 116 mg/dL — ABNORMAL HIGH (ref 70–99)

## 2018-07-20 LAB — PROTIME-INR
INR: 1.51
Prothrombin Time: 18 seconds — ABNORMAL HIGH (ref 11.4–15.2)

## 2018-07-20 LAB — BASIC METABOLIC PANEL
ANION GAP: 4 — AB (ref 5–15)
BUN: 24 mg/dL — ABNORMAL HIGH (ref 8–23)
CO2: 21 mmol/L — ABNORMAL LOW (ref 22–32)
Calcium: 7.8 mg/dL — ABNORMAL LOW (ref 8.9–10.3)
Chloride: 117 mmol/L — ABNORMAL HIGH (ref 98–111)
Creatinine, Ser: 1.33 mg/dL — ABNORMAL HIGH (ref 0.61–1.24)
GFR, EST AFRICAN AMERICAN: 58 mL/min — AB (ref >60)
GFR, EST NON AFRICAN AMERICAN: 50 mL/min — AB (ref >60)
Glucose, Bld: 125 mg/dL — ABNORMAL HIGH (ref 70–99)
Potassium: 3.6 mmol/L (ref 3.5–5.1)
SODIUM: 142 mmol/L (ref 135–145)

## 2018-07-20 SURGERY — AMPUTATION BELOW KNEE
Anesthesia: Regional | Site: Foot | Laterality: Left

## 2018-07-20 MED ORDER — ONDANSETRON HCL 4 MG/2ML IJ SOLN
INTRAMUSCULAR | Status: DC | PRN
  Administered 2018-07-20: 4 mg via INTRAVENOUS

## 2018-07-20 MED ORDER — 0.9 % SODIUM CHLORIDE (POUR BTL) OPTIME
TOPICAL | Status: DC | PRN
  Administered 2018-07-20: 1000 mL

## 2018-07-20 MED ORDER — ALBUMIN HUMAN 5 % IV SOLN
INTRAVENOUS | Status: DC | PRN
  Administered 2018-07-20: 09:00:00 via INTRAVENOUS

## 2018-07-20 MED ORDER — SODIUM CHLORIDE 0.9 % IV BOLUS
1000.0000 mL | Freq: Once | INTRAVENOUS | Status: AC
  Administered 2018-07-20: 1000 mL via INTRAVENOUS

## 2018-07-20 MED ORDER — ACETAMINOPHEN 10 MG/ML IV SOLN: 1000.0000 mg | Freq: Once | INTRAVENOUS | Status: DC | PRN

## 2018-07-20 MED ORDER — FENTANYL CITRATE (PF) 100 MCG/2ML IJ SOLN
INTRAMUSCULAR | Status: DC | PRN
  Administered 2018-07-20: 25 ug via INTRAVENOUS

## 2018-07-20 MED ORDER — ONDANSETRON HCL 4 MG/2ML IJ SOLN: 4.0000 mg | Freq: Once | INTRAMUSCULAR | Status: DC | PRN

## 2018-07-20 MED ORDER — LIDOCAINE 2% (20 MG/ML) 5 ML SYRINGE
INTRAMUSCULAR | Status: AC
  Filled 2018-07-20: qty 5

## 2018-07-20 MED ORDER — ALBUMIN HUMAN 5 % IV SOLN
12.5000 g | Freq: Once | INTRAVENOUS | Status: AC
  Administered 2018-07-20: 12.5 g via INTRAVENOUS

## 2018-07-20 MED ORDER — FENTANYL CITRATE (PF) 100 MCG/2ML IJ SOLN: 25.0000 ug | INTRAMUSCULAR | Status: DC | PRN

## 2018-07-20 MED ORDER — DIPHENHYDRAMINE HCL 12.5 MG/5ML PO ELIX: 12.5000 mg | ORAL_SOLUTION | ORAL | Status: DC | PRN

## 2018-07-20 MED ORDER — LIDOCAINE 2% (20 MG/ML) 5 ML SYRINGE
INTRAMUSCULAR | Status: DC | PRN
  Administered 2018-07-20: 50 mg via INTRAVENOUS

## 2018-07-20 MED ORDER — SODIUM CHLORIDE 0.9 % IV SOLN
INTRAVENOUS | Status: DC | PRN
  Administered 2018-07-20: 25 ug/min via INTRAVENOUS

## 2018-07-20 MED ORDER — MORPHINE SULFATE (PF) 4 MG/ML IV SOLN
4.0000 mg | Freq: Once | INTRAVENOUS | Status: AC
  Administered 2018-07-20: 4 mg via INTRAVENOUS
  Filled 2018-07-20: qty 1

## 2018-07-20 MED ORDER — PROPOFOL 10 MG/ML IV BOLUS
INTRAVENOUS | Status: AC
  Filled 2018-07-20: qty 20

## 2018-07-20 MED ORDER — METOCLOPRAMIDE HCL 5 MG/ML IJ SOLN: 5.0000 mg | Freq: Three times a day (TID) | INTRAMUSCULAR | Status: DC | PRN

## 2018-07-20 MED ORDER — DOCUSATE SODIUM 100 MG PO CAPS
100.0000 mg | ORAL_CAPSULE | Freq: Two times a day (BID) | ORAL | Status: DC
  Administered 2018-07-20 – 2018-07-25 (×9): 100 mg via ORAL
  Filled 2018-07-20 (×9): qty 1

## 2018-07-20 MED ORDER — ONDANSETRON HCL 4 MG/2ML IJ SOLN
4.0000 mg | Freq: Four times a day (QID) | INTRAMUSCULAR | Status: DC | PRN
  Filled 2018-07-20: qty 2

## 2018-07-20 MED ORDER — ALBUMIN HUMAN 5 % IV SOLN
INTRAVENOUS | Status: AC
  Filled 2018-07-20: qty 250

## 2018-07-20 MED ORDER — METOCLOPRAMIDE HCL 5 MG PO TABS: 5.0000 mg | ORAL_TABLET | Freq: Three times a day (TID) | ORAL | Status: DC | PRN

## 2018-07-20 MED ORDER — ONDANSETRON HCL 4 MG/2ML IJ SOLN
INTRAMUSCULAR | Status: AC
  Filled 2018-07-20: qty 2

## 2018-07-20 MED ORDER — FENTANYL CITRATE (PF) 100 MCG/2ML IJ SOLN
INTRAMUSCULAR | Status: AC
  Filled 2018-07-20: qty 2

## 2018-07-20 MED ORDER — PROPOFOL 10 MG/ML IV BOLUS
INTRAVENOUS | Status: DC | PRN
  Administered 2018-07-20: 70 mg via INTRAVENOUS

## 2018-07-20 MED ORDER — ONDANSETRON HCL 4 MG PO TABS: 4.0000 mg | ORAL_TABLET | Freq: Four times a day (QID) | ORAL | Status: DC | PRN

## 2018-07-20 SURGICAL SUPPLY — 50 items
BAG ZIPLOCK 12X15 (MISCELLANEOUS) ×3 IMPLANT
BANDAGE ACE 4X5 VEL STRL LF (GAUZE/BANDAGES/DRESSINGS) ×3 IMPLANT
BANDAGE ACE 6X5 VEL STRL LF (GAUZE/BANDAGES/DRESSINGS) ×3 IMPLANT
BANDAGE ESMARK 6X9 LF (GAUZE/BANDAGES/DRESSINGS) ×1 IMPLANT
BLADE SAW GIGLI 510 (BLADE) IMPLANT
BLADE SAW GIGLI 510MM (BLADE)
BLADE SAW SAG 73X25 THK (BLADE) ×1
BLADE SAW SGTL 73X25 THK (BLADE) ×2 IMPLANT
BNDG ESMARK 6X9 LF (GAUZE/BANDAGES/DRESSINGS) ×3
BNDG GAUZE ELAST 4 BULKY (GAUZE/BANDAGES/DRESSINGS) ×3 IMPLANT
CHLORAPREP W/TINT 26ML (MISCELLANEOUS) ×3 IMPLANT
COVER SURGICAL LIGHT HANDLE (MISCELLANEOUS) ×3 IMPLANT
CUFF TOURN SGL QUICK 18 (TOURNIQUET CUFF) ×3 IMPLANT
CUFF TOURN SGL QUICK 34 (TOURNIQUET CUFF)
CUFF TRNQT CYL 34X4X40X1 (TOURNIQUET CUFF) IMPLANT
DRAIN PENROSE 18X1/2 LTX STRL (DRAIN) IMPLANT
DRAPE U-SHAPE 47X51 STRL (DRAPES) ×3 IMPLANT
DRSG EMULSION OIL 3X16 NADH (GAUZE/BANDAGES/DRESSINGS) ×3 IMPLANT
DRSG PAD ABDOMINAL 8X10 ST (GAUZE/BANDAGES/DRESSINGS) ×3 IMPLANT
DURAPREP 26ML APPLICATOR (WOUND CARE) IMPLANT
ELECT REM PT RETURN 15FT ADLT (MISCELLANEOUS) ×3 IMPLANT
GAUZE SPONGE 4X4 12PLY STRL (GAUZE/BANDAGES/DRESSINGS) ×3 IMPLANT
GAUZE XEROFORM 5X9 LF (GAUZE/BANDAGES/DRESSINGS) ×3 IMPLANT
GLOVE BIO SURGEON STRL SZ8 (GLOVE) ×3 IMPLANT
GLOVE BIOGEL PI IND STRL 7.5 (GLOVE) ×2 IMPLANT
GLOVE BIOGEL PI INDICATOR 7.5 (GLOVE) ×4
GLOVE ECLIPSE 7.5 STRL STRAW (GLOVE) ×6 IMPLANT
HANDPIECE INTERPULSE COAX TIP (DISPOSABLE) ×2
HOOD PEEL AWAY FLYTE STAYCOOL (MISCELLANEOUS) IMPLANT
MANIFOLD NEPTUNE II (INSTRUMENTS) ×3 IMPLANT
NS IRRIG 1000ML POUR BTL (IV SOLUTION) ×3 IMPLANT
PACK ORTHO EXTREMITY (CUSTOM PROCEDURE TRAY) ×3 IMPLANT
PAD ABD 8X10 STRL (GAUZE/BANDAGES/DRESSINGS) ×6 IMPLANT
PADDING CAST COTTON 6X4 STRL (CAST SUPPLIES) ×6 IMPLANT
POSITIONER SURGICAL ARM (MISCELLANEOUS) ×6 IMPLANT
SET HNDPC FAN SPRY TIP SCT (DISPOSABLE) ×1 IMPLANT
SPLINT PLASTER CAST XFAST 5X30 (CAST SUPPLIES) ×1 IMPLANT
SPLINT PLASTER XFAST SET 5X30 (CAST SUPPLIES) ×2
SPONGE LAP 18X18 RF (DISPOSABLE) ×3 IMPLANT
STAPLER VISISTAT (STAPLE) ×3 IMPLANT
STAPLER VISISTAT 35W (STAPLE) ×3 IMPLANT
STOCKINETTE 8 INCH (MISCELLANEOUS) ×3 IMPLANT
SUT ETHILON 2 0 PSLX (SUTURE) ×3 IMPLANT
SUT SILK 2 0 (SUTURE)
SUT SILK 2 0 SH CR/8 (SUTURE) IMPLANT
SUT SILK 2-0 18XBRD TIE 12 (SUTURE) IMPLANT
SUT VIC AB 1 CT1 36 (SUTURE) IMPLANT
SUT VIC AB 2-0 CT1 27 (SUTURE) ×2
SUT VIC AB 2-0 CT1 27XBRD (SUTURE) ×1 IMPLANT
WATER STERILE IRR 1000ML POUR (IV SOLUTION) ×3 IMPLANT

## 2018-07-20 NOTE — Anesthesia Procedure Notes (Signed)
Anesthesia Regional Block: Popliteal block   Pre-Anesthetic Checklist: ,, timeout performed, Correct Patient, Correct Site, Correct Laterality, Correct Procedure, Correct Position, site marked, Risks and benefits discussed, pre-op evaluation,  At surgeon's request and post-op pain management  Laterality: Left  Prep: Maximum Sterile Barrier Precautions used, chloraprep       Needles:  Injection technique: Single-shot  Needle Type: Echogenic Needle     Needle Length: 9cm  Needle Gauge: 21     Additional Needles:   Procedures:,,,, ultrasound used (permanent image in chart),,,,  Narrative:  Start time: 07/20/2018 7:20 AM End time: 07/20/2018 7:28 AM Injection made incrementally with aspirations every 5 mL. Anesthesiologist: Barnet Glasgow, MD

## 2018-07-20 NOTE — Progress Notes (Signed)
PROGRESS NOTE [facility]   JOSEP LUVIANO   BMW:[id number] DOB: 04/24/1941  DOA: 07/18/2018 PCP: Patient, No Pcp Per   Brief Narrative:  Steven Duffy 77 year old male with extensive medical history as listed below in APP note.  [facility] was [facility], due to left heel wound which have been worsening with pain and drainage.  Patient has been seen by wound care and been treated with antibiotics however wound continues to worsen.  Today at wound care evaluation patient was noted to be hypotensive, stated increasing pain and generalized weakness, wound care provider not this increase overall in green drainage not seen previously.  Patient was sent to the emergency department for evaluation of sepsis.  Upon ED evaluation patient was found to be hypotensive, lethargic, left heel wound with purulent drainage.  X-ray shows evidence of osteomyelitis.  Creatinine 1.5 at baseline.  Patient was admitted with working diagnosis of osteomyelitis  Subjective: Patient seen and examined, doing well, s/p debridement of left achilles tendon and calcaneous and left heel ulceration.   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   S/p debridement of left achilles tendon and calcaneous and left heel ulceration. 8/11 Will continue  IV abx for now with vancomycin and cefepime for at least 24-48 hrs then switch to oral. Will discuss with ID length of treatment. Pain management as needed.  Hypertension BP remains soft, continue to hold antihypertensive medication.  Will give IV bolus, and monitor, may be due to anesthesia and pain meds.   CKD stage III Cr remains stable  Avoid hypotension and nephrotoxic agent  Type 2 diabetes mellitus Not on medication, A1c 7.7, will start SSI for now. Monitor CBGs  DVT prophylaxis: SCD's  Code Status: Full Code  Family Communication: Wife at bedside Disposition Plan: Expect d/c in 2-3 days   Consultants:   Orthopedic surgery   Procedures:   None   Antimicrobials: Anti-infectives (From admission, onward)   Start     Dose/Rate Route Frequency Ordered Stop   07/19/18 1600  vancomycin (VANCOCIN) IVPB 750 mg/150 ml premix     750 mg 150 mL/hr over 60 Minutes Intravenous Every 24 hours 07/18/18 1713     07/19/18 0600  ceFEPIme (MAXIPIME) 1 g in sodium chloride 0.9 % 100 mL IVPB     1 g 200 mL/hr over 30 Minutes Intravenous Every 12 hours 07/18/18 1816     07/18/18 1830  ceFEPIme (MAXIPIME) 2 g in sodium chloride 0.9 % 100 mL IVPB     2 g 200 mL/hr over 30 Minutes Intravenous  Once 07/18/18 1816 07/18/18 2148   07/18/18 1700  ceFEPIme (MAXIPIME) 2 g in sodium chloride 0.9 % 100 mL IVPB  Status:  Discontinued     2 g 200 mL/hr over 30 Minutes Intravenous  Once 07/18/18 1647 07/18/18 1816   07/18/18  1600  vancomycin (VANCOCIN) IVPB 1000 mg/200 mL premix     1,000 mg 200 mL/hr over 60 Minutes Intravenous  Once 07/18/18 1552 07/18/18 1733         Objective: Vitals:   07/20/18 1030 07/20/18 1100 07/20/18 1302 07/20/18 1402  BP: (!) 88/62 (!) 87/64 94/63 (!) 82/56  Pulse: 65 65 65 65  Resp: 10 12 16 16   Temp:  97.8 F (36.6 C) 97.8 F (36.6 C)   TempSrc:   Oral   SpO2: 99% 98% 100% 99%  Weight:        Intake/Output Summary (Last 24 hours) at 07/20/2018  1537 Last data filed at 07/20/2018 1026 Gross per 24 hour  Intake 4392.13 ml  Output 1250 ml  Net 3142.13 ml   Filed Weights   07/18/18 1657 07/19/18 0709  Weight: 66.2 kg 70.8 kg    Examination:  General: NAD, alert and interactive  Cardiovascular: RRR, S1/S2  Respiratory: CTA bilaterally Abdominal: Soft Extremities: Left side hemiparesis, pulses intact. Dressing in place    Data Reviewed: I have personally reviewed following labs and imaging studies  CBC: Recent Labs  Lab 07/18/18 1310 07/19/18 0528 07/20/18 0448  WBC 9.4 7.4 7.4  NEUTROABS 7.1  --  5.4  HGB 13.7 11.7* 10.6*  HCT 44.5 37.3* 34.5*  MCV 93.5 93.3 93.0  PLT 273 235 403   Basic Metabolic Panel: Recent Labs  Lab 07/18/18 1310 07/19/18 0528 07/20/18 0448  NA 141 141 142  K 3.3* 3.7 3.6  CL 111 116* 117*  CO2 22 20* 21*  GLUCOSE 156* 100* 125*  BUN 28* 23 24*  CREATININE 1.58* 1.33* 1.33*  CALCIUM 8.7* 8.0* 7.8*   GFR: CrCl cannot be calculated (Unknown ideal weight.). Liver Function Tests: Recent Labs  Lab 07/18/18 1310  AST 19  ALT 14  ALKPHOS 113  BILITOT 0.4  PROT 7.1  ALBUMIN 3.2*   No results for input(s): LIPASE, AMYLASE in the last 168 hours. No results for input(s): AMMONIA in the last 168 hours. Coagulation Profile: Recent Labs  Lab 07/18/18 1310 07/20/18 0448  INR 1.49 1.51   Cardiac Enzymes: No results for input(s): CKTOTAL, CKMB, CKMBINDEX, TROPONINI in the last 168 hours. BNP (last 3 results) No results for input(s): PROBNP in the last 8760 hours. HbA1C: Recent Labs    07/19/18 0528  HGBA1C 7.1*   CBG: Recent Labs  Lab 07/19/18 1141 07/19/18 1706 07/19/18 2016 07/20/18 0942 07/20/18 1135  GLUCAP 96 144* 208* 99 116*   Lipid Profile: No results for input(s): CHOL, HDL, LDLCALC, TRIG, CHOLHDL, LDLDIRECT in the last 72 hours. Thyroid Function Tests: No results for input(s): TSH, T4TOTAL, FREET4, T3FREE, THYROIDAB in the last 72 hours. Anemia  Panel: No results for input(s): VITAMINB12, FOLATE, FERRITIN, TIBC, IRON, RETICCTPCT in the last 72 hours. Sepsis Labs: Recent Labs  Lab 07/18/18 1417  LATICACIDVEN 1.26    Recent Results (from the past 240 hour(s))  Culture, blood (Routine x 2)     Status: None (Preliminary result)   Collection Time: 07/18/18  1:10 PM  Result Value Ref Range Status   Specimen Description   Final    BLOOD RIGHT ANTECUBITAL Performed at [facility] [address], [place], [place] [address]    Special Requests   Final    BOTTLES DRAWN AEROBIC AND ANAEROBIC Blood Culture adequate volume Performed at [facility] [address] [place], [place] [address]    Culture   Final  NO GROWTH 2 DAYS Performed at [facility], [place] [address], [place], [place] [address]    Report Status PENDING  Incomplete  Culture, blood (Routine x 2)     Status: None (Preliminary result)   Collection Time: 07/18/18  4:16 PM  Result Value Ref Range Status   Specimen Description   Final    BLOOD LEFT ANTECUBITAL Performed at [facility] [address], [place], [place] [address]    Special Requests   Final    BOTTLES DRAWN AEROBIC AND ANAEROBIC Blood Culture adequate volume Performed at [facility] [address] [place], [place] [address]    Culture   Final    NO GROWTH 2 DAYS Performed at [facility] [address] [place], [place] [address]    Report Status PENDING  Incomplete  Surgical pcr screen     Status: None   Collection Time: 07/19/18  8:44 PM  Result Value Ref Range Status   MRSA, PCR NEGATIVE NEGATIVE Final   Staphylococcus aureus NEGATIVE NEGATIVE Final    Comment: (NOTE) The Xpert SA Assay (FDA approved for NASAL specimens in patients 74 years of age and older), is one component of a comprehensive surveillance program. It is not intended to diagnose infection nor to guide or monitor  treatment. Performed at [facility], [place] [address] [place], [place] [address]       Radiology Studies: No results found.    Scheduled Meds: . atorvastatin  10 mg Oral Daily  . atropine  1 drop Right Eye Daily  . buPROPion  150 mg Oral q morning - 10a  . digoxin  0.0625 mg Oral q morning - 10a  . diltiazem  180 mg Oral q morning - 10a  . docusate sodium  100 mg Oral BID  . dorzolamide  1 drop Right Eye BID   And  . timolol  1 drop Right Eye BID  . escitalopram  20 mg Oral q morning - 10a  . famotidine  20 mg Oral BID  . feeding supplement  1 Container Oral TID WC  . furosemide  40 mg Oral Daily  . gabapentin  300 mg Oral BID  . insulin aspart  0-9 Units Subcutaneous TID WC  . latanoprost  1 drop Right Eye QHS  . levothyroxine  37.5 mcg Oral QAC breakfast  . pilocarpine  2 drop Right Eye QID  . tamsulosin  0.4 mg Oral QPM   Continuous Infusions: . sodium chloride 100 mL/hr at 07/20/18 1105  . ceFEPime (MAXIPIME) IV 1 g (07/20/18 1201)  . sodium chloride    . vancomycin Stopped (07/19/18 1852)     LOS: 2 days   Time spent: Total of 25 minutes spent with pt, greater than 50% of which was spent in discussion of  treatment, counseling and coordination of care  Chipper Oman, MD Pager: Text Page via [email or web address]   If 7PM-7AM, please contact night-coverage [email or web address] 07/20/2018, 3:37 PM   Note - This record has been created using [employer]. Chart creation errors have been sought, but may not always have been located. Such creation errors do not reflect on the standard of medical care.

## 2018-07-20 NOTE — Anesthesia Procedure Notes (Signed)
Procedure Name: LMA Insertion Date/Time: 07/20/2018 8:15 AM Performed by: Lissa Morales, CRNA Pre-anesthesia Checklist: Patient identified, Emergency Drugs available, Suction available and Patient being monitored Patient Re-evaluated:Patient Re-evaluated prior to induction Oxygen Delivery Method: Circle system utilized Preoxygenation: Pre-oxygenation with 100% oxygen Induction Type: IV induction Ventilation: Mask ventilation without difficulty LMA: LMA with gastric port inserted LMA Size: 4.0 Tube type: Oral Number of attempts: 1 Airway Equipment and Method: Oral airway Placement Confirmation: positive ETCO2 Tube secured with: Tape Dental Injury: Teeth and Oropharynx as per pre-operative assessment

## 2018-07-20 NOTE — Brief Op Note (Signed)
07/18/2018 - 07/20/2018  9:13 AM  PATIENT:  Steven Duffy  77 y.o. male  PRE-OPERATIVE DIAGNOSIS:  left heel infection  POST-OPERATIVE DIAGNOSIS:  left heel infection  PROCEDURE:  Procedure(s): Debridement of left achilles tendon and calcaneous and left heel ulceration (Left)  SURGEON:  Surgeon(s) and Role:    Dorna Leitz, MD - Primary  PHYSICIAN ASSISTANT:   ASSISTANTS: Loni Dolly PA   ANESTHESIA:   general  EBL:  150 mL   BLOOD ADMINISTERED:none  DRAINS: none   LOCAL MEDICATIONS USED:  NONE  SPECIMEN:  No Specimen  DISPOSITION OF SPECIMEN:  N/A  COUNTS:  YES  TOURNIQUET:   Total Tourniquet Time Documented: Calf (Left) - 29 minutes Total: Calf (Left) - 29 minutes   DICTATION: .Other Dictation: Dictation Number [id number]  PLAN OF CARE: Admit to inpatient   PATIENT DISPOSITION:  PACU - hemodynamically stable.   Delay start of Pharmacological VTE agent (>24hrs) due to surgical blood loss or risk of bleeding: no

## 2018-07-20 NOTE — OR Nursing (Signed)
Patient arrived in PACU for pre-op. OR notified.

## 2018-07-20 NOTE — Transfer of Care (Signed)
Immediate Anesthesia Transfer of Care Note  Patient: Steven Duffy  Procedure(s) Performed: Debridement of left achilles tendon and calcaneous and left heel ulceration (Left Foot)  Patient Location: PACU  Anesthesia Type:General  Level of Consciousness: sedated and patient cooperative  Airway & Oxygen Therapy: Patient Spontanous Breathing and Patient connected to face mask oxygen  Post-op Assessment: Report given to RN and Post -op Vital signs reviewed and stable  Post vital signs: stable  Last Vitals:  Vitals Value Taken Time  BP 83/58 07/20/2018 10:04 AM  Temp    Pulse 65 07/20/2018 10:13 AM  Resp 11 07/20/2018 10:13 AM  SpO2 98 % 07/20/2018 10:13 AM  Vitals shown include unvalidated device data.  Last Pain:  Vitals:   07/20/18 1000  TempSrc:   PainSc: 0-No pain      Patients Stated Pain Goal: 3 (43/14/27 6701)  Complications: No apparent anesthesia complications

## 2018-07-20 NOTE — Anesthesia Postprocedure Evaluation (Signed)
Anesthesia Post Note  Patient: Steven Duffy  Procedure(s) Performed: Debridement of left achilles tendon and calcaneous and left heel ulceration (Left Foot)     Patient location during evaluation: PACU Anesthesia Type: Regional and General Level of consciousness: awake and alert Pain management: pain level controlled Vital Signs Assessment: post-procedure vital signs reviewed and stable Respiratory status: spontaneous breathing, nonlabored ventilation, respiratory function stable and patient connected to nasal cannula oxygen Cardiovascular status: blood pressure returned to baseline and stable Postop Assessment: no apparent nausea or vomiting Anesthetic complications: no    Last Vitals:  Vitals:   07/20/18 1000 07/20/18 1015  BP: (!) 83/52 (!) 81/68  Pulse: 65 65  Resp: 10 12  Temp:    SpO2: 98% 98%    Last Pain:  Vitals:   07/20/18 1015  TempSrc:   PainSc: Doyle A Caroljean Monsivais

## 2018-07-21 ENCOUNTER — Encounter (HOSPITAL_COMMUNITY): Payer: Self-pay | Admitting: Orthopedic Surgery

## 2018-07-21 LAB — URINALYSIS, ROUTINE W REFLEX MICROSCOPIC
BILIRUBIN URINE: NEGATIVE
GLUCOSE, UA: NEGATIVE mg/dL
HGB URINE DIPSTICK: NEGATIVE
Ketones, ur: NEGATIVE mg/dL
Leukocytes, UA: NEGATIVE
Nitrite: NEGATIVE
Protein, ur: NEGATIVE mg/dL
SPECIFIC GRAVITY, URINE: 1.006 (ref 1.005–1.030)
pH: 5 (ref 5.0–8.0)

## 2018-07-21 LAB — GLUCOSE, CAPILLARY
GLUCOSE-CAPILLARY: 117 mg/dL — AB (ref 70–99)
GLUCOSE-CAPILLARY: 148 mg/dL — AB (ref 70–99)
GLUCOSE-CAPILLARY: 152 mg/dL — AB (ref 70–99)
Glucose-Capillary: 199 mg/dL — ABNORMAL HIGH (ref 70–99)

## 2018-07-21 LAB — CBC WITH DIFFERENTIAL/PLATELET
BASOS ABS: 0 10*3/uL (ref 0.0–0.1)
Basophils Relative: 0 %
EOS ABS: 0.1 10*3/uL (ref 0.0–0.7)
EOS PCT: 2 %
HCT: 30.9 % — ABNORMAL LOW (ref 39.0–52.0)
Hemoglobin: 9.4 g/dL — ABNORMAL LOW (ref 13.0–17.0)
LYMPHS PCT: 12 %
Lymphs Abs: 0.8 10*3/uL (ref 0.7–4.0)
MCH: 28.7 pg (ref 26.0–34.0)
MCHC: 30.4 g/dL (ref 30.0–36.0)
MCV: 94.2 fL (ref 78.0–100.0)
MONO ABS: 0.6 10*3/uL (ref 0.1–1.0)
Monocytes Relative: 9 %
Neutro Abs: 5.2 10*3/uL (ref 1.7–7.7)
Neutrophils Relative %: 77 %
PLATELETS: 216 10*3/uL (ref 150–400)
RBC: 3.28 MIL/uL — ABNORMAL LOW (ref 4.22–5.81)
RDW: 14.7 % (ref 11.5–15.5)
WBC: 6.7 10*3/uL (ref 4.0–10.5)

## 2018-07-21 MED ORDER — DABIGATRAN ETEXILATE MESYLATE 150 MG PO CAPS
150.0000 mg | ORAL_CAPSULE | Freq: Two times a day (BID) | ORAL | Status: DC
  Administered 2018-07-21 – 2018-07-25 (×8): 150 mg via ORAL
  Filled 2018-07-21 (×8): qty 1

## 2018-07-21 NOTE — NC FL2 (Signed)
[place] LEVEL OF CARE SCREENING TOOL     IDENTIFICATION  Patient Name: Steven Duffy Birthdate: 02/18/1941 Sex: male Admission Date (Current Location): 07/18/2018  [facility] [place] Number:  Herbalist and Address:  [facility],  [place], [place]      Provider Number: [id number]  Attending Physician Name and Address:  Patrecia Pour, Christean Grief, MD  Relative Name and Phone Number:       Current Level of Care: Hospital Recommended Level of Care: [place] Prior Approval Number:    Date Approved/Denied:   PASRR Number: [id number] (expires 07/23/18- updated pasrr pending)  Discharge Plan: SNF    Current Diagnoses: Patient Active Problem List   Diagnosis Date Noted  . Cellulitis of left foot 07/18/2018  . Renal calculi 10/07/2017  . 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 Right lower lobe pneumonia ([place]) 02/21/2017  . Hypokalemia 02/21/2017  . Acute renal failure superimposed on stage 3 chronic kidney disease ([place]) 02/21/2017  . Leukocytosis 02/21/2017  . Anemia of chronic disease 02/21/2017  . Diabetes mellitus, type 2 ([place]) 02/21/2017  . BPH (benign prostatic hyperplasia) 02/21/2017  . Depression 02/21/2017  . Benign essential HTN 02/21/2017  . AF (paroxysmal atrial fibrillation) ([place]) 02/21/2017  . 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 Left hemiplegia ([place]) 09/26/2015  . CAD (coronary artery disease) 09/02/2015  . Obstructive sleep apnea syndrome 02/25/2012  . Pacemaker reprogramming/check 02/25/2012  . 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/28/2011    Orientation RESPIRATION BLADDER Height & Weight     Self, Time, Situation, Place  Normal(cpap at night, 21 % o2, resp rate 16) Continent, External catheter Weight: 168 lb 10.4 oz (76.5 kg) Height:  5\' 7"  (170.2 cm)  BEHAVIORAL SYMPTOMS/MOOD NEUROLOGICAL BOWEL NUTRITION STATUS      Continent Diet(carb modified)  AMBULATORY STATUS COMMUNICATION OF NEEDS Skin   Total Care Verbally PU Stage and Appropriate Care(Complex open wound of the heel with exposed tendon and bone in a patient who is a non-ambulatory)                       Personal Care Assistance Level of Assistance  Bathing, Feeding, Dressing Bathing Assistance: Maximum assistance Feeding assistance: Maximum assistance Dressing Assistance: Maximum assistance     Functional Limitations Info  Sight, Hearing, Speech Sight Info: Impaired(legally blind) Hearing Info: Adequate Speech Info: Adequate    SPECIAL CARE FACTORS FREQUENCY  PT (By licensed PT)     PT Frequency: 5x              Contractures Contractures Info: Present    Additional Factors Info  Code Status, Allergies Code Status Info: full code Allergies Info: Duloxetine, Iodine, Lyrica Pregabalin, Shellfish-derived Products, Procaine, Ciprofloxacin, Penicillins, Tape           Current Medications (07/21/2018):  This is the current hospital active medication list Current Facility-Administered Medications  Medication Dose Route Frequency Provider Last Rate Last Dose  . 0.9 %  sodium chloride infusion   Intravenous Continuous Loni Dolly, PA-C 100 mL/hr at 07/21/18 1531    . acetaminophen (TYLENOL) tablet 650 mg  650 mg Oral Q6H PRN Loni Dolly, PA-C       Or  . acetaminophen (TYLENOL) suppository 650 mg  650 mg Rectal Q6H PRN Loni Dolly, PA-C      . atorvastatin (LIPITOR) tablet 10 mg  10 mg Oral Daily Loni Dolly, PA-C  10 mg at 07/21/18 1046  . atropine 1 % ophthalmic solution 1 drop  1 drop Right Eye Daily Loni Dolly, PA-C   1 drop at 07/21/18 1104  . bisacodyl (DULCOLAX) suppository 10 mg  10 mg Rectal Daily PRN Loni Dolly, PA-C      . buPROPion (WELLBUTRIN XL) 24 hr tablet 150 mg  150 mg Oral q morning - 10a Loni Dolly, PA-C   150 mg at 07/21/18 1046  . ceFEPIme (MAXIPIME) 1 g in sodium chloride 0.9 % 100 mL IVPB   1 g Intravenous Q12H Loni Dolly, PA-C   Stopped at 07/21/18 1125  . dabigatran (PRADAXA) capsule 150 mg  150 mg Oral Q12H Patrecia Pour, Christean Grief, MD      . digoxin (LANOXIN) tablet 0.0625 mg  0.0625 mg Oral q morning - 10a Loni Dolly, PA-C   0.0625 mg at 07/21/18 1104  . diltiazem (CARDIZEM CD) 24 hr capsule 180 mg  180 mg Oral q morning - 10a Loni Dolly, PA-C   180 mg at 07/21/18 1047  . diphenhydrAMINE (BENADRYL) 12.5 MG/5ML elixir 12.5-25 mg  12.5-25 mg Oral Q4H PRN Loni Dolly, PA-C      . docusate sodium (COLACE) capsule 100 mg  100 mg Oral BID Loni Dolly, PA-C   100 mg at 07/21/18 1048  . dorzolamide (TRUSOPT) 2 % ophthalmic solution 1 drop  1 drop Right Eye BID Loni Dolly, PA-C   1 drop at 07/21/18 1059   And  . timolol (TIMOPTIC) 0.5 % ophthalmic solution 1 drop  1 drop Right Eye BID Loni Dolly, PA-C   1 drop at 07/21/18 1059  . escitalopram (LEXAPRO) tablet 20 mg  20 mg Oral q morning - 10a Loni Dolly, PA-C   20 mg at 07/21/18 1046  . famotidine (PEPCID) tablet 20 mg  20 mg Oral BID Loni Dolly, PA-C   20 mg at 07/21/18 1047  . feeding supplement (BOOST / RESOURCE BREEZE) liquid 1 Container  1 Container Oral TID WC Loni Dolly, PA-C   1 Container at 07/20/18 1253  . furosemide (LASIX) tablet 40 mg  40 mg Oral Daily Loni Dolly, PA-C   40 mg at 07/21/18 1047  . gabapentin (NEURONTIN) capsule 300 mg  300 mg Oral BID Loni Dolly, PA-C   300 mg at 07/21/18 1047  . HYDROcodone-acetaminophen (NORCO/VICODIN) 5-325 MG per tablet 1-2 tablet  1-2 tablet Oral Q4H PRN Loni Dolly, PA-C   2 tablet at 07/21/18 1043  . insulin aspart (novoLOG) injection 0-9 Units  0-9 Units Subcutaneous TID WC Loni Dolly, PA-C   1 Units at 07/21/18 1321  . latanoprost (XALATAN) 0.005 % ophthalmic solution 1 drop  1 drop Right Eye QHS Loni Dolly, PA-C   1 drop at 07/20/18 2114  . levothyroxine (SYNTHROID, LEVOTHROID) tablet 37.5 mcg  37.5 mcg Oral QAC breakfast Loni Dolly, PA-C   37.5 mcg at 07/21/18  1044  . metoCLOPramide (REGLAN) tablet 5-10 mg  5-10 mg Oral Q8H PRN Loni Dolly, PA-C       Or  . metoCLOPramide (REGLAN) injection 5-10 mg  5-10 mg Intravenous Q8H PRN Loni Dolly, PA-C      . morphine 2 MG/ML injection 1 mg  1 mg Intravenous Q4H PRN Loni Dolly, PA-C   1 mg at 07/20/18 0512  . ondansetron (ZOFRAN) tablet 4 mg  4 mg Oral Q6H PRN Loni Dolly, PA-C       Or  . ondansetron [employer]) injection 4 mg  4 mg Intravenous  Q6H PRN Loni Dolly, PA-C      . pilocarpine (PILOCAR) 1 % ophthalmic solution 2 drop  2 drop Right Eye QID Loni Dolly, PA-C   2 drop at 07/21/18 1322  . polyethylene glycol (MIRALAX / GLYCOLAX) packet 17 g  17 g Oral Daily PRN Loni Dolly, PA-C      . senna-docusate (Senokot-S) tablet 1 tablet  1 tablet Oral QHS PRN Loni Dolly, PA-C   1 tablet at 07/20/18 1213  . tamsulosin (FLOMAX) capsule 0.4 mg  0.4 mg Oral QPM Loni Dolly, PA-C   0.4 mg at 07/20/18 1913  . vancomycin (VANCOCIN) IVPB 750 mg/150 ml premix  750 mg Intravenous Q24H Loni Dolly, PA-C 150 mL/hr at 07/21/18 1549 750 mg at 07/21/18 1549     Discharge Medications: Please see discharge summary for a list of discharge medications.  Relevant Imaging Results:  Relevant Lab Results:   Additional Information SSn: Dundy Lorenda Cahill, LCSW

## 2018-07-21 NOTE — Progress Notes (Signed)
PROGRESS NOTE [facility]   Steven Duffy   VEL:[id number] DOB: 06-25-1941  DOA: 07/18/2018 PCP: Patient, No Pcp Per   Brief Narrative:  Steven Duffy 77 year old male with extensive medical history as listed below in APP note.  Patient was sent from [facility], due to left heel wound which have been worsening with pain and drainage.  Patient has been seen by wound care and been treated with antibiotics however wound continues to worsen.  Today at wound care evaluation patient was noted to be hypotensive, stated increasing pain and generalized weakness, wound care provider not this increase overall in green drainage not seen previously.  Patient was sent to the emergency department for evaluation of sepsis.  Upon ED evaluation patient was found to be hypotensive, lethargic, left heel wound with purulent drainage.  X-ray shows evidence of osteomyelitis.  Creatinine 1.5 at baseline.  Patient was admitted with working diagnosis of osteomyelitis  Subjective: Patient seen and examined, doing well. No acute events overnight. Had some pain this am but improved with pain meds.   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   S/p debridement of left achilles tendon and calcaneous and left heel ulceration. 8/11 IV abx for 24 hrs  and the oral antibiotics for 5 more days, case discussed with ID. Pain management as needed. Patient may need SNF. SW consulted     Hypertension BP improved, continue to monitor   CKD stage III Cr remains stable  Avoid hypotension and nephrotoxic agent  Type 2 diabetes mellitus Not on medication, A1c 7.7, will start SSI for now. Monitor CBGs  PAF  HR stable, patient previously in Pradaxa which was held due to procedure. Ok to resume, Pradaxa tonight.   DVT prophylaxis: SCD's  Code Status: Full Code  Family Communication: Wife at bedside Disposition Plan: D/c in AM to SNF   Consultants:   Orthopedic surgery   Procedures:   None   Antimicrobials: Anti-infectives (From admission, onward)   Start     Dose/Rate Route Frequency Ordered Stop   07/19/18 1600  vancomycin (VANCOCIN) IVPB 750 mg/150 ml premix     750 mg 150 mL/hr over 60 Minutes Intravenous Every 24 hours 07/18/18 1713     07/19/18 0600  ceFEPIme (MAXIPIME) 1 g in sodium chloride 0.9 % 100 mL IVPB     1 g 200 mL/hr over 30 Minutes Intravenous Every 12 hours 07/18/18 1816     07/18/18 1830  ceFEPIme (MAXIPIME) 2 g in sodium chloride 0.9 % 100 mL IVPB     2 g 200 mL/hr over 30 Minutes Intravenous  Once 07/18/18 1816 07/18/18 2148   07/18/18 1700  ceFEPIme (MAXIPIME) 2 g in sodium chloride 0.9 % 100 mL IVPB  Status:  Discontinued     2 g 200 mL/hr over 30 Minutes Intravenous  Once 07/18/18 1647  07/18/18 1816   07/18/18 1600  vancomycin (VANCOCIN) IVPB 1000 mg/200 mL premix     1,000 mg 200 mL/hr over 60 Minutes Intravenous  Once 07/18/18 1552 07/18/18 1733         Objective: Vitals:   07/20/18 1555 07/20/18 2100 07/21/18 0537 07/21/18 1340  BP: 93/62 101/61 106/73 122/61  Pulse:  64 66 66  Resp: 14 16 (!) 1 16  Temp: 97.9 F (36.6 C) 97.9 F (36.6 C) (!) 97.2 F (36.2 C) 98.3 F (36.8 C)  TempSrc: Oral Oral Oral Oral  SpO2: 95% 100% 99% 98%  Weight:   76.5 kg   Height:   5\' 7"  (1.702 m)      Intake/Output Summary (Last 24 hours) at 07/21/2018 1647 Last data filed at 07/21/2018 1531 Gross per 24 hour  Intake 3961.16 ml  Output 1125 ml  Net 2836.16 ml   Filed Weights   07/18/18 1657 07/19/18 0709 07/21/18 0537  Weight: 66.2 kg 70.8 kg 76.5 kg    Examination:  General: NAD, alert and interactive  Cardiovascular: RRR, S1/S2  Respiratory: CTA bilaterally Abdominal: Soft Extremities: Left side hemiparesis, pulses intact. Dressing in place    Data Reviewed: I have personally reviewed following labs and imaging studies  CBC: Recent Labs  Lab 07/18/18 1310 07/19/18 0528 07/20/18 0448 07/21/18 0523  WBC 9.4 7.4 7.4 6.7  NEUTROABS 7.1  --  5.4 5.2  HGB 13.7 11.7* 10.6* 9.4*  HCT 44.5 37.3* 34.5* 30.9*  MCV 93.5 93.3 93.0 94.2  PLT 273 235 240 638   Basic Metabolic Panel: Recent Labs  Lab 07/18/18 1310 07/19/18 0528 07/20/18 0448  NA 141 141 142  K 3.3* 3.7 3.6  CL 111 116* 117*  CO2 22 20* 21*  GLUCOSE 156* 100* 125*  BUN 28* 23 24*  CREATININE 1.58* 1.33* 1.33*  CALCIUM 8.7* 8.0* 7.8*   GFR: Estimated Creatinine Clearance: 44.2 mL/min (A) (by C-G formula based on SCr of 1.33 mg/dL (H)). Liver Function Tests: Recent Labs  Lab 07/18/18 1310  AST 19  ALT 14  ALKPHOS 113  BILITOT 0.4  PROT 7.1  ALBUMIN 3.2*   No results for input(s): LIPASE, AMYLASE in the last 168 hours. No results for input(s): AMMONIA in the last 168 hours. Coagulation Profile: Recent Labs  Lab 07/18/18 1310 07/20/18 0448  INR 1.49 1.51   Cardiac Enzymes: No results for input(s): CKTOTAL, CKMB, CKMBINDEX, TROPONINI in the last 168 hours. BNP (last 3 results) No results for input(s): PROBNP in the last 8760 hours. HbA1C: Recent Labs    07/19/18 0528  HGBA1C 7.1*   CBG: Recent Labs  Lab 07/20/18 1135 07/20/18 1702 07/20/18 2110 07/21/18 0731 07/21/18 1133  GLUCAP 116* 220* 132* 117* 148*   Lipid Profile: No results for input(s): CHOL, HDL, LDLCALC,  TRIG, CHOLHDL, LDLDIRECT in the last 72 hours. Thyroid Function Tests: No results for input(s): TSH, T4TOTAL, FREET4, T3FREE, THYROIDAB in the last 72 hours. Anemia Panel: No results for input(s): VITAMINB12, FOLATE, FERRITIN, TIBC, IRON, RETICCTPCT in the last 72 hours. Sepsis Labs: Recent Labs  Lab 07/18/18 1417  LATICACIDVEN 1.26    Recent Results (from the past 240 hour(s))  Culture, blood (Routine x 2)     Status: None (Preliminary result)   Collection Time: 07/18/18  1:10 PM  Result Value Ref Range Status   Specimen Description   Final    BLOOD RIGHT ANTECUBITAL Performed at [facility] [address] [place], [place] [address]  Special Requests   Final    BOTTLES DRAWN AEROBIC AND ANAEROBIC Blood Culture adequate volume Performed at [facility] [address] [place], [place] [address]    Culture   Final    NO GROWTH 3 DAYS Performed at [facility], [place] [address] [place], [place] [address]    Report Status PENDING  Incomplete  Culture, blood (Routine x 2)     Status: None (Preliminary result)   Collection Time: 07/18/18  4:16 PM  Result Value Ref Range Status   Specimen Description   Final    BLOOD LEFT ANTECUBITAL Performed at [facility] [address], [place], [place] [address]    Special Requests   Final    BOTTLES DRAWN AEROBIC AND ANAEROBIC Blood Culture adequate volume Performed at [facility] [address] [place], [place] [address]    Culture   Final    NO GROWTH 3 DAYS Performed at [facility], [place] [address] [place], [place] [address]    Report Status PENDING  Incomplete  Surgical pcr screen     Status: None   Collection Time: 07/19/18  8:44 PM  Result Value Ref Range Status   MRSA, PCR NEGATIVE NEGATIVE Final   Staphylococcus aureus NEGATIVE NEGATIVE Final    Comment: (NOTE) The Xpert SA Assay (FDA approved for NASAL specimens in patients  29 years of age and older), is one component of a comprehensive surveillance program. It is not intended to diagnose infection nor to guide or monitor treatment. Performed at [facility], [place] [address], [place], [place] [address]       Radiology Studies: No results found.    Scheduled Meds: . atorvastatin  10 mg Oral Daily  . atropine  1 drop Right Eye Daily  . buPROPion  150 mg Oral q morning - 10a  . dabigatran  150 mg Oral Q12H  . digoxin  0.0625 mg Oral q morning - 10a  . diltiazem  180 mg Oral q morning - 10a  . docusate sodium  100 mg Oral BID  . dorzolamide  1 drop Right Eye BID   And  . timolol  1 drop Right Eye BID  . escitalopram  20 mg Oral q morning - 10a  . famotidine  20 mg Oral BID  . feeding supplement  1 Container Oral TID WC  . furosemide  40 mg Oral Daily  . gabapentin  300 mg Oral BID  . insulin aspart  0-9 Units Subcutaneous TID WC  . latanoprost  1 drop Right Eye QHS  . levothyroxine  37.5 mcg Oral QAC breakfast  . pilocarpine  2 drop Right Eye QID  . tamsulosin  0.4 mg Oral QPM   Continuous Infusions: . sodium chloride 100 mL/hr at 07/21/18 1531  . ceFEPime (MAXIPIME) IV Stopped (07/21/18 1125)  . vancomycin 750 mg (07/21/18 1549)     LOS: 3 days   Time spent: Total of 25 minutes spent with pt, greater than 50% of which was spent in discussion of  treatment, counseling and coordination of care  Chipper Oman, MD Pager: Text Page via [email or web address]   If 7PM-7AM, please contact night-coverage [email or web address] 07/21/2018, 4:47 PM   Note - This record has been created using [employer]. Chart creation errors have been sought, but may not always have been located. Such creation errors do not reflect on the standard of medical care.

## 2018-07-21 NOTE — Progress Notes (Signed)
Pt has community health services through [facility].  Spoke with pt's social worker Marcie Bal and with pt and family re: DC plan. Pt explains that he is essentially bed bound however prior to admission was able to transfer from bed to chair with wife's assistance. Now unable to. States they are getting a hoyer lift for home.  Discussed SNF recommendation and that at baseline pt likely will continue to meet level of care needs justifying SNF. Pt reports he does not want to pursue any long term care but if able to admit to SNF to work toward transferring with assistance, he would prefer to do that prior to returning home with his wife. Wife agrees.  Completed FL2- noted pt has temporary PASRR which expires 07/23/18. Submitted for updated pasrr.  Pt prefers [facility] as he and wife have admitted there in the past. Made referral and will work with [employer] staff on plan as well.  Sharren Bridge, MSW, LCSW Clinical Social Work 07/21/2018 [phone]

## 2018-07-21 NOTE — Consult Note (Addendum)
WOC consult for left heel requested prior to ortho service involvement.  They are now following for assessment and plan of care and pt has gone to the OR, according to progress notes.  Please refer to their team for further questions. Please re-consult if further assistance is needed.  Thank-you,  Julien Girt MSN, [place], [place], [place], [place]

## 2018-07-21 NOTE — Progress Notes (Signed)
Subjective: 1 Day Post-Op Procedure(s) (LRB): Debridement of left achilles tendon and calcaneous and left heel ulceration (Left) Patient reports pain as mild.    Objective: Vital signs in last 24 hours: Temp:  [97.2 F (36.2 C)-97.9 F (36.6 C)] 97.2 F (36.2 C) (08/12 0537) Pulse Rate:  [64-66] 66 (08/12 0537) Resp:  [1-16] 1 (08/12 0537) BP: (81-106)/(52-73) 106/73 (08/12 0537) SpO2:  [95 %-100 %] 99 % (08/12 0537) Weight:  [76.5 kg] 76.5 kg (08/12 0537)  Intake/Output from previous day: 08/11 0701 - 08/12 0700 In: 3672.9 [I.V.:2972.9; IV Piggyback:700] Out: 2761 [Urine:1125; Blood:150] Intake/Output this shift: Total I/O In: 466.6 [I.V.:466.6] Out: -   Recent Labs    07/18/18 1310 07/19/18 0528 07/20/18 0448 07/21/18 0523  HGB 13.7 11.7* 10.6* 9.4*   Recent Labs    07/20/18 0448 07/21/18 0523  WBC 7.4 6.7  RBC 3.71* 3.28*  HCT 34.5* 30.9*  PLT 240 216   Recent Labs    07/19/18 0528 07/20/18 0448  NA 141 142  K 3.7 3.6  CL 116* 117*  CO2 20* 21*  BUN 23 24*  CREATININE 1.33* 1.33*  GLUCOSE 100* 125*  CALCIUM 8.0* 7.8*   Recent Labs    07/18/18 1310 07/20/18 0448  INR 1.49 1.51    Neurologically intact Compartment soft    Assessment/Plan: 1 Day Post-Op Procedure(s) (LRB): Debridement of left achilles tendon and calcaneous and left heel ulceration (Left) Advance diet Up with therapy  Discharge will be per his medical team.  Is not clear to me whether he will be able to go home.  I would keep him in his splint until we see him in the office.  I would probably see him in the office in 2 weeks for follow-up.  I am happy to see him sooner but I do not think we would do much differently.  His medical team will need to decide his duration of IV antibiotic therapy and oral antibiotic therapy.  I do feel we got an excellent debridement and had a very nice tension-free closure of the heel.    Alta Corning 07/21/2018, 8:31 AM

## 2018-07-21 NOTE — Evaluation (Addendum)
Physical Therapy Evaluation Patient Details Name: Steven Duffy MRN: [id number] DOB: 12-16-1940 Today's Date: 07/21/2018   History of Present Illness  77 yo male admitted with L foot cellulitis, osteomyelitis. S/P I&D, tenolysis achilles tendon, partial ostectomy calcaneus 07/20/18. Hx of CVA with L hemiparesis, blindness, ICH, Sz, DM, neuropathy, pacemaker, CHF, CKD, CAD, PAF.     Clinical Impression  On eval, pt was Max assist (+2 for safety/assist) for rolling to L and R sides with assistance of bedpad. Educated pt on NWB status of L LE. Hoyer lift utilized for bed to chair transfer. Wife was present during session. She states she is unable to manage pt's care at home (medically and physically). She is interested in SNF placement. Recommend SNF at this time.     Follow Up Recommendations SNF    Equipment Recommendations  None recommended by PT    Recommendations for Other Services       Precautions / Restrictions Precautions Precautions: Fall Precaution Comments: L hemiparesis. Blind.  Restrictions Weight Bearing Restrictions: Yes LLE Weight Bearing: Non weight bearing      Mobility  Bed Mobility Overal bed mobility: Needs Assistance Bed Mobility: Rolling Rolling: Max assist +2 to R side, Max assist to L side         General bed mobility comments: Assist for trunk and LEs. Utilized bedpad to aid with turning, positioning. Assist needed to guide and place pt's R hand on bedrail.   Transfers                 General transfer comment: NT-hoyer lift at baseline per wife  Ambulation/Gait             General Gait Details: nonambulatory  Stairs            Wheelchair Mobility    Modified Rankin (Stroke Patients Only)       Balance                                             Pertinent Vitals/Pain Pain Assessment: Faces Pain Score: 10-Worst pain ever Pain Location: neck; headache Pain Descriptors / Indicators:  Aching;Grimacing;Headache;Discomfort Pain Intervention(s): Limited activity within patient's tolerance;Repositioned    Home Living Family/patient expects to be discharged to:: Unsure Living Arrangements: Spouse/significant other Available Help at Discharge: Family;Personal care attendant(aide 2 hours am/pm. PACE 5 days/week) Type of Home: House Home Access: Ramped entrance     Home Layout: One level Home Equipment: Hospital bed;Grab bars - toilet;Grab bars - tub/shower;Wheelchair - power;Wheelchair - manual(lift chair; [employer] fast streamer)      Prior Function Level of Independence: Needs assistance   Gait / Transfers Assistance Needed: nonambulatory. hoyer lift for transfers  ADL's / Homemaking Assistance Needed: total assist        Hand Dominance        Extremity/Trunk Assessment   Upper Extremity Assessment Upper Extremity Assessment: RUE deficits/detail;LUE deficits/detail RUE Deficits / Details: Strength at least 3/5 LUE Deficits / Details: 0/5. no functional use. hand/fingers contracted    Lower Extremity Assessment Lower Extremity Assessment: LLE deficits/detail;RLE deficits/detail RLE Deficits / Details: Strength ~2/5 LLE Deficits / Details: 0/5. no functional use    Cervical / Trunk Assessment Cervical / Trunk Assessment: Other exceptions Cervical / Trunk Exceptions: neck contracted to right side  Communication   Communication: No difficulties  Cognition Arousal/Alertness: Awake/alert Behavior During Therapy: WFL for  tasks assessed/performed Overall Cognitive Status: Within Functional Limits for tasks assessed                                        General Comments      Exercises     Assessment/Plan    PT Assessment All further PT needs can be met in the next venue of care(SNF)  PT Problem List         PT Treatment Interventions      PT Goals (Current goals can be found in the Care Plan section)  Acute Rehab PT Goals Patient Stated  Goal: wife wants rehab. pt wants home PT Goal Formulation: All assessment and education complete, DC therapy    Frequency     Barriers to discharge        Co-evaluation               AM-PAC PT "6 Clicks" Daily Activity  Outcome Measure Difficulty turning over in bed (including adjusting bedclothes, sheets and blankets)?: Unable Difficulty moving from lying on back to sitting on the side of the bed? : Unable Difficulty sitting down on and standing up from a chair with arms (e.g., wheelchair, bedside commode, etc,.)?: Unable Help needed moving to and from a bed to chair (including a wheelchair)?: Total Help needed walking in hospital room?: Total Help needed climbing 3-5 steps with a railing? : Total 6 Click Score: 6    End of Session Equipment Utilized During Treatment: (hoyer lift) Activity Tolerance: Patient limited by pain Patient left: in chair;with call bell/phone within reach;with family/visitor present Nurse Communication: Need for lift equipment;Patient requests pain meds PT Visit Diagnosis: Pain;Other abnormalities of gait and mobility (R26.89);Hemiplegia and hemiparesis;Muscle weakness (generalized) (M62.81) Hemiplegia - dominant/non-dominant: Non-dominant Hemiplegia - caused by: Cerebral infarction Pain - part of body: (head/neck)    Time: 5072-2575 PT Time Calculation (min) (ACUTE ONLY): 44 min   Charges:   PT Evaluation $PT Eval Moderate Complexity: 1 Mod PT Treatments $Therapeutic Activity: 23-37 mins         Weston Anna, MPT Pager: [phone]

## 2018-07-21 NOTE — Progress Notes (Signed)
Pharmacy Antibiotic Note:  Steven Duffy is a 77 y.o. male admitted on 07/18/2018 with increased wound infection ,weakness and low BP.  Pt having purulent drainage from left foot wound.  Pharmacy was consulted for vancomycin and cefepime for cellulitis.  Plan: Vancomycin 1gm IV x 1 then 750mg  Q24h (AUC 494.1. Scr 1.58) Cefepime 2gm IV x 1 then 1gm Q12h Anticipate change to po abx tomorrow per MD note,   Height: 5\' 7"  (170.2 cm) Weight: 168 lb 10.4 oz (76.5 kg) IBW/kg (Calculated) : 66.1  Temp (24hrs), Avg:97.8 F (36.6 C), Min:97.2 F (36.2 C), Max:98.3 F (36.8 C)  Recent Labs  Lab 07/18/18 1310 07/18/18 1417 07/19/18 0528 07/20/18 0448 07/21/18 0523  WBC 9.4  --  7.4 7.4 6.7  CREATININE 1.58*  --  1.33* 1.33*  --   LATICACIDVEN  --  1.26  --   --   --     Estimated Creatinine Clearance: 44.2 mL/min (A) (by C-G formula based on SCr of 1.33 mg/dL (H)).    Allergies  Allergen Reactions  . Duloxetine Nausea And Vomiting and Other (See Comments)    depression  . Iodine Swelling  . Lyrica [Pregabalin] Other (See Comments)    Depression, suicidal   . Shellfish-Derived Products Swelling  . Procaine Other (See Comments)    unspecified  . Ciprofloxacin Rash  . Penicillins Rash    CHILDHOOD ALLERGY Has patient had a PCN reaction causing immediate rash, facial/tongue/throat swelling, SOB or lightheadedness with hypotension: YES Has patient had a PCN reaction causing severe rash involving mucus membranes or skin necrosis: No Has patient had a PCN reaction that required hospitalization NO Has patient had a PCN reaction occurring within the last 10 years: NO  If all of the above answers are "NO", then may proceed with Cephalosporin use.   . Tape Rash    NOT paper tape   Antimicrobials this admission: 8/9 vanc >> 8/9 cefepime >> Dose adjustments this admission:  Microbiology results: 8/9 BCx: ngtd  Thank you for allowing pharmacy to be a part of this patient's  care.  Minda Ditto PharmD Pager [phone] 07/21/2018, 3:04 PM

## 2018-07-21 NOTE — Op Note (Signed)
NAME: Steven Duffy, Steven Duffy MEDICAL RECORD RC:[id number] ACCOUNT [id number] DATE OF BIRTH:1940/12/28 FACILITY: [facility] LOCATION: [id number] PHYSICIAN:Abelina Ketron Maudie Mercury, MD  OPERATIVE REPORT  DATE OF PROCEDURE:  07/20/2018  He is a 77 year old male on the internal medicine service.    PREOPERATIVE DIAGNOSIS:  Complex open wound of the heel with exposed tendon and bone in a patient who is a non-ambulator.  POSTOPERATIVE DIAGNOSIS:  Complex open wound of the heel with exposed tendon and bone in a patient who is a non-ambulator.  PROCEDURE: 1.  Tenolysis of the Achilles tendon. 2.  Partial ostectomy of the calcaneus. 3.  Excisional debridement of skin, subcutaneous tissue, muscle and bone at the site of an open wound.  SURGEON:  Dorna Leitz, MD  ANESTHESIA:  General with a popliteal block.  BRIEF HISTORY:  The patient is a 77 year old male with a long history of significant complaints of left heel wound.  He had been treated conservatively for a period of time with wound management.  Nothing was working.  He developed cellulitis and  drainage, and after evaluation and discussion, we felt that the options were to do an amputation versus trying to do a local debridement.  After discussion with him and the family, they decided they would like to do local debridement.  He was brought to  the operating room for this procedure.  DESCRIPTION OF PROCEDURE:  The patient was brought to the operating room after adequate anesthesia was obtained with a general anesthetic.  The patient was placed in a sloppy lateral position.  He was then prepped and draped in the usual sterile fashion.   Following this, an incision was made from 10 cm proximal to the heel until about 10 cm distal to the heel.  We then dissected down to the level of the Achilles tendon and made a sharp dissection along the Achilles tendon proximally.  As we got down to  the calcaneus, we did a very closed dissection to the calcaneus and basically  denuded the calcaneus on the medial and lateral aspects as far back as we could.  We then tenotomized the Achilles tendon by taking it directly off of the bone.  Then with the  saw took a large portion of the calcaneus out by way of ostectomy of the calcaneus.  At this point, the remaining infected tissues including skin, subcutaneous tissue, muscle, and fascia and remaining portions of bone were debrided, and this was done  with scissors and a rongeur.  At this point, an auto irrigator was used to irrigate this wound thoroughly.  Once this was completed, we took a look and saw what we had skin wise.  Actually, we had very nice closure of the skin at this point, so we took a  couple of 2-0 Vicryls in deep to get some muscle and fascia closed over the calcaneus where it had been excised.  We then used a couple of 2-0's in the subcutaneous tissue and then actually stapled the skin proximally and distally to try to take  continuous tension off the wound.  A sterile and compressive dressing was applied as well as a posterior splint, and the patient was taken to the recovery room where he was noted to be in satisfactory condition.  Estimated blood loss for the procedure  was about 150 mL, but the final can be gotten from the anesthetic record.  LN/NUANCE  D:07/20/2018 T:07/21/2018 JOB:[id number]

## 2018-07-22 LAB — GLUCOSE, CAPILLARY
GLUCOSE-CAPILLARY: 149 mg/dL — AB (ref 70–99)
Glucose-Capillary: 110 mg/dL — ABNORMAL HIGH (ref 70–99)
Glucose-Capillary: 182 mg/dL — ABNORMAL HIGH (ref 70–99)

## 2018-07-22 LAB — BASIC METABOLIC PANEL WITH GFR
Anion gap: 6 (ref 5–15)
BUN: 16 mg/dL (ref 8–23)
CO2: 18 mmol/L — ABNORMAL LOW (ref 22–32)
Calcium: 7.4 mg/dL — ABNORMAL LOW (ref 8.9–10.3)
Chloride: 117 mmol/L — ABNORMAL HIGH (ref 98–111)
Creatinine, Ser: 1.15 mg/dL (ref 0.61–1.24)
GFR calc Af Amer: >60 mL/min
GFR calc non Af Amer: 60 mL/min — ABNORMAL LOW
Glucose, Bld: 134 mg/dL — ABNORMAL HIGH (ref 70–99)
Potassium: 3.2 mmol/L — ABNORMAL LOW (ref 3.5–5.1)
Sodium: 141 mmol/L (ref 135–145)

## 2018-07-22 MED ORDER — GABAPENTIN 300 MG PO CAPS
300.0000 mg | ORAL_CAPSULE | Freq: Three times a day (TID) | ORAL | Status: DC
  Administered 2018-07-22 – 2018-07-25 (×8): 300 mg via ORAL
  Filled 2018-07-22 (×9): qty 1

## 2018-07-22 MED ORDER — POTASSIUM CHLORIDE CRYS ER 20 MEQ PO TBCR
40.0000 meq | EXTENDED_RELEASE_TABLET | Freq: Once | ORAL | Status: AC
  Administered 2018-07-22: 40 meq via ORAL
  Filled 2018-07-22: qty 2

## 2018-07-22 NOTE — Care Management Important Message (Signed)
Important Message  Patient Details  Name: Steven Duffy MRN: [id number] Date of Birth: 03-29-1941   Medicare Important Message Given:  Yes    Kerin Salen 07/22/2018, 11:01 AMImportant Message  Patient Details  Name: Steven Duffy MRN: [id number] Date of Birth: [birth year]   Medicare Important Message Given:  Yes    Kerin Salen 07/22/2018, 11:00 AM

## 2018-07-22 NOTE — Progress Notes (Addendum)
  30 Day Unplanned Readmission Risk Score     ED to Hosp-Admission (Current) from 07/18/2018 in [facility]  30 Day Unplanned Readmission Risk Score (%)  21 Filed at 07/22/2018 1200     This score is the patient's risk of an unplanned readmission within 30 days of being discharged (0 -100%). The score is based on dignosis, age, lab data, medications, orders, and past utilization.   Low:  0-14.9   Medium: 15-21.9   High: 22-29.9   Extreme: 30 and above        Readmission Risk Prevention Plan 07/22/2018  Transportation Screening Complete  PCP or Specialist Appt within 5-7 Days Not Complete  Not Complete comments (No Data)  Home Care Screening Complete  Medication Review (RN CM) Complete  Some recent data might be hidden  Pt to make Orthopedic Surgery appointment in 2 weeks. Marney Doctor RN,BSN [phone]

## 2018-07-22 NOTE — Progress Notes (Signed)
Subjective: 2 Days Post-Op Procedure(s) (LRB): Debridement of left achilles tendon and calcaneous and left heel ulceration (Left) Patient reports pain as mild.  Patient resting in bed with CPAP in place.  Objective: Vital signs in last 24 hours: Temp:  [97.9 F (36.6 C)-99.2 F (37.3 C)] 97.9 F (36.6 C) (08/13 0422) Pulse Rate:  [65-66] 65 (08/13 0422) Resp:  [16-17] 17 (08/13 0422) BP: (95-122)/(53-61) 95/60 (08/13 0422) SpO2:  [97 %-98 %] 98 % (08/13 0422) Weight:  [79.7 kg] 79.7 kg (08/13 0554)  Intake/Output from previous day: 08/12 0701 - 08/13 0700 In: 3428 [P.O.:480; I.V.:2449.6; IV Piggyback:498.4] Out: 1300 [Urine:1300] Intake/Output this shift: Total I/O In: 130.2 [I.V.:130.2] Out: -   Recent Labs    07/20/18 0448 07/21/18 0523  HGB 10.6* 9.4*   Recent Labs    07/20/18 0448 07/21/18 0523  WBC 7.4 6.7  RBC 3.71* 3.28*  HCT 34.5* 30.9*  PLT 240 216   Recent Labs    07/20/18 0448 07/22/18 0518  NA 142 141  K 3.6 3.2*  CL 117* 117*  CO2 21* 18*  BUN 24* 16  CREATININE 1.33* 1.15  GLUCOSE 125* 134*  CALCIUM 7.8* 7.4*   Recent Labs    07/20/18 0448  INR 1.51  Left lower extremity exam: Short leg posterior splint intact.  Moves toes actively.  No obvious drainage.    Assessment/Plan: 2 Days Post-Op Procedure(s) (LRB): Debridement of left achilles tendon and calcaneous and left heel ulceration (Left) Plan: We will probably need discharge to skilled nursing facility. Antibiotics per medicine service. See Dr. Berenice Primas note from yesterday for details. Follow-up with Dr. Berenice Primas in the office in 2 weeks. Nonweightbearing on the left lower extremity.  Try to elevate the leg as much as possible.    Steven Duffy 07/22/2018, 8:27 AM

## 2018-07-22 NOTE — Progress Notes (Signed)
PROGRESS NOTE [facility]   [name]   RXV:[id number] DOB: [birth year]  DOA: 07/18/2018 PCP: Steven Duffy, No Pcp Per   Brief Narrative:  Steven Duffy 77 year old male with extensive medical history as listed below in APP note.  Steven Duffy was [facility], due to left heel wound which have been worsening with pain and drainage.  Steven Duffy has been seen by wound care and been treated with antibiotics however wound continues to worsen.  Today at wound care evaluation Steven Duffy was noted to be hypotensive, stated increasing pain and generalized weakness, wound care provider not this increase overall in green drainage not seen previously.  Steven Duffy was sent to the emergency department for evaluation of sepsis.  Upon ED evaluation Steven Duffy was found to be hypotensive, lethargic, left heel wound with purulent drainage.  X-ray shows evidence of osteomyelitis.  Creatinine 1.5 at baseline.  Steven Duffy was admitted with working diagnosis of osteomyelitis  Subjective: Steven Duffy doing well no complaints. 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   S/p debridement of left achilles tendon and calcaneous and left heel ulceration. 8/11 Was treated with IV cefepime and  Vancomycin for 4 days, will switch to Augmentin to complete total of 10 days.   daPain management as needed. Steven Duffy may need SNF. SW consulted     Hypertension BP stable, continue to monitor   CKD stage III Remains stable  Avoid hypotension and nephrotoxic agent  Type 2 diabetes mellitus Not on medication, A1c 7.7. Continue SSI for now  Monitor CBGs  PAF  HR stable, Steven Duffy previously in Pradaxa which was held due to procedure. Pradaxa resumed 8/12.   DVT prophylaxis: SCD's  Code Status: Full Code  Family Communication: Wife at bedside Disposition Plan: D/c in AM to SNF   Consultants:   Orthopedic surgery   Procedures:   None   Antimicrobials: Anti-infectives (From admission, onward)   Start     Dose/Rate Route Frequency Ordered Stop   07/19/18 1600  vancomycin (VANCOCIN) IVPB 750 mg/150 ml premix     750 mg 150 mL/hr over 60 Minutes Intravenous Every 24 hours 07/18/18 1713     07/19/18 0600  ceFEPIme (MAXIPIME) 1 g in sodium chloride 0.9 % 100 mL IVPB     1 g 200 mL/hr over 30 Minutes Intravenous Every 12 hours 07/18/18 1816     07/18/18 1830  ceFEPIme (MAXIPIME) 2 g in sodium chloride 0.9 % 100 mL IVPB     2 g 200 mL/hr over 30 Minutes Intravenous  Once 07/18/18 1816 07/18/18 2148   07/18/18 1700  ceFEPIme (MAXIPIME) 2 g in sodium chloride 0.9 % 100 mL IVPB  Status:  Discontinued     2 g 200 mL/hr over 30 Minutes Intravenous  Once 07/18/18 1647 07/18/18 1816  07/18/18 1600  vancomycin (VANCOCIN) IVPB 1000 mg/200 mL premix     1,000 mg 200 mL/hr over 60 Minutes Intravenous  Once 07/18/18 1552 07/18/18 1733         Objective: Vitals:   07/21/18 1340 07/21/18 2026 07/22/18 0422 07/22/18 0554  BP: 122/61 (!) 95/53 95/60   Pulse: 66 65 65   Resp: 16 16 17    Temp: 98.3 F (36.8 C) 99.2 F (37.3 C) 97.9 F (36.6 C)   TempSrc: Oral Oral Oral   SpO2: 98% 97% 98%   Weight:    79.7 kg  Height:        Intake/Output Summary (Last 24 hours) at 07/22/2018  1526 Last data filed at 07/22/2018 0919 Gross per 24 hour  Intake 2851.53 ml  Output 1300 ml  Net 1551.53 ml   Filed Weights   07/19/18 0709 07/21/18 0537 07/22/18 0554  Weight: 70.8 kg 76.5 kg 79.7 kg    Examination:  General: NAD, alert and interactive  Cardiovascular: RRR, S1/S2  Respiratory: CTA bilaterally Abdominal: Soft Extremities: Left side hemiparesis, pulses intact. Dressing in place    Data Reviewed: I have personally reviewed following labs and imaging studies  CBC: Recent Labs  Lab 07/18/18 1310 07/19/18 0528 07/20/18 0448 07/21/18 0523  WBC 9.4 7.4 7.4 6.7  NEUTROABS 7.1  --  5.4 5.2  HGB 13.7 11.7* 10.6* 9.4*  HCT 44.5 37.3* 34.5* 30.9*  MCV 93.5 93.3 93.0 94.2  PLT 273 235 240 841   Basic Metabolic Panel: Recent Labs  Lab 07/18/18 1310 07/19/18 0528 07/20/18 0448 07/22/18 0518  NA 141 141 142 141  K 3.3* 3.7 3.6 3.2*  CL 111 116* 117* 117*  CO2 22 20* 21* 18*  GLUCOSE 156* 100* 125* 134*  BUN 28* 23 24* 16  CREATININE 1.58* 1.33* 1.33* 1.15  CALCIUM 8.7* 8.0* 7.8* 7.4*   GFR: Estimated Creatinine Clearance: 55.3 mL/min (by C-G formula based on SCr of 1.15 mg/dL). Liver Function Tests: Recent Labs  Lab 07/18/18 1310  AST 19  ALT 14  ALKPHOS 113  BILITOT 0.4  PROT 7.1  ALBUMIN 3.2*   No results for input(s): LIPASE, AMYLASE in the last 168 hours. No results for input(s): AMMONIA in the last 168 hours. Coagulation Profile: Recent Labs  Lab 07/18/18 1310 07/20/18 0448  INR 1.49 1.51   Cardiac Enzymes: No results for input(s): CKTOTAL, CKMB, CKMBINDEX, TROPONINI in the last 168 hours. BNP (last 3 results) No results for input(s): PROBNP in the last 8760 hours. HbA1C: No results for input(s): HGBA1C in the last 72 hours. CBG: Recent Labs  Lab 07/21/18 1133 07/21/18 1649 07/21/18 2033 07/22/18 0743 07/22/18 1149  GLUCAP 148* 199* 152* 110* 182*   Lipid Profile: No results for input(s): CHOL, HDL, LDLCALC, TRIG,  CHOLHDL, LDLDIRECT in the last 72 hours. Thyroid Function Tests: No results for input(s): TSH, T4TOTAL, FREET4, T3FREE, THYROIDAB in the last 72 hours. Anemia Panel: No results for input(s): VITAMINB12, FOLATE, FERRITIN, TIBC, IRON, RETICCTPCT in the last 72 hours. Sepsis Labs: Recent Labs  Lab 07/18/18 1417  LATICACIDVEN 1.26    Recent Results (from the past 240 hour(s))  Culture, blood (Routine x 2)     Status: None (Preliminary result)   Collection Time: 07/18/18  1:10 PM  Result Value Ref Range Status   Specimen Description   Final    BLOOD RIGHT ANTECUBITAL Performed at [facility] [address] [place], [place] [address]    Special Requests  Final    BOTTLES DRAWN AEROBIC AND ANAEROBIC Blood Culture adequate volume Performed at [facility] [address] [place], [place] [address]    Culture   Final    NO GROWTH 4 DAYS Performed at [facility], [place] [address] [place], [place] [address]    Report Status PENDING  Incomplete  Culture, blood (Routine x 2)     Status: None (Preliminary result)   Collection Time: 07/18/18  4:16 PM  Result Value Ref Range Status   Specimen Description   Final    BLOOD LEFT ANTECUBITAL Performed at [facility] [address] [place], [place] [address]    Special Requests   Final    BOTTLES DRAWN AEROBIC AND ANAEROBIC Blood Culture adequate volume Performed at [facility] [address], [place], [place] [address]    Culture   Final    NO GROWTH 4 DAYS Performed at [facility], [place] [address], [place], [place] [address]    Report Status PENDING  Incomplete  Surgical pcr screen     Status: None   Collection Time: 07/19/18  8:44 PM  Result Value Ref Range Status   MRSA, PCR NEGATIVE NEGATIVE Final   Staphylococcus aureus NEGATIVE NEGATIVE Final    Comment: (NOTE) The Xpert SA Assay (FDA approved for NASAL specimens in patients  75 years of age and older), is one component of a comprehensive surveillance program. It is not intended to diagnose infection nor to guide or monitor treatment. Performed at [facility], [place] [address], [place], [place] [address]       Radiology Studies: No results found.    Scheduled Meds: . atorvastatin  10 mg Oral Daily  . atropine  1 drop Right Eye Daily  . buPROPion  150 mg Oral q morning - 10a  . dabigatran  150 mg Oral Q12H  . digoxin  0.0625 mg Oral q morning - 10a  . diltiazem  180 mg Oral q morning - 10a  . docusate sodium  100 mg Oral BID  . dorzolamide  1 drop Right Eye BID   And  . timolol  1 drop Right Eye BID  . escitalopram  20 mg Oral q morning - 10a  . famotidine  20 mg Oral BID  . feeding supplement  1 Container Oral TID WC  . furosemide  40 mg Oral Daily  . gabapentin  300 mg Oral BID  . insulin aspart  0-9 Units Subcutaneous TID WC  . latanoprost  1 drop Right Eye QHS  . levothyroxine  37.5 mcg Oral QAC breakfast  . pilocarpine  2 drop Right Eye QID  . tamsulosin  0.4 mg Oral QPM   Continuous Infusions: . sodium chloride 100 mL/hr at 07/22/18 1157  . ceFEPime (MAXIPIME) IV 1 g (07/22/18 1048)  . vancomycin Stopped (07/21/18 1649)     LOS: 4 days   Time spent: Total of 25 minutes spent with pt, greater than 50% of which was spent in discussion of  treatment, counseling and coordination of care  Chipper Oman, MD Pager: Text Page via [email or web address]   If 7PM-7AM, please contact night-coverage [email or web address] 07/22/2018, 3:26 PM   Note - This record has been created using [employer]. Chart creation errors have been sought, but may not always have been located. Such creation errors do not reflect on the standard of medical care.

## 2018-07-22 NOTE — Progress Notes (Signed)
Provided additional information requested to [place] [facility] for pt's PASRR screening.  Pasrr approval still pending. Pt/wife have accepted bed offer at [facility]. Left voicemail for wife to update her on status today. Also left voicemail for [facility] social worker with update.  Sharren Bridge, MSW, LCSW Clinical Social Work 07/22/2018 [phone]

## 2018-07-23 DIAGNOSIS — L03116 Cellulitis of left lower limb: Principal | ICD-10-CM

## 2018-07-23 LAB — GLUCOSE, CAPILLARY
GLUCOSE-CAPILLARY: 207 mg/dL — AB (ref 70–99)
GLUCOSE-CAPILLARY: 138 mg/dL — AB (ref 70–99)
GLUCOSE-CAPILLARY: 136 mg/dL — AB (ref 70–99)
Glucose-Capillary: 129 mg/dL — ABNORMAL HIGH (ref 70–99)
Glucose-Capillary: 124 mg/dL — ABNORMAL HIGH (ref 70–99)

## 2018-07-23 LAB — CULTURE, BLOOD (ROUTINE X 2)
CULTURE: NO GROWTH
Culture: NO GROWTH
SPECIAL REQUESTS: ADEQUATE
Special Requests: ADEQUATE

## 2018-07-23 MED ORDER — LIP MEDEX EX OINT
TOPICAL_OINTMENT | CUTANEOUS | Status: AC
  Administered 2018-07-24
  Filled 2018-07-23: qty 7

## 2018-07-23 MED ORDER — HYDROCODONE-ACETAMINOPHEN 5-325 MG PO TABS: 1.0000 | ORAL_TABLET | ORAL | 0 refills | Status: AC | PRN

## 2018-07-23 MED ORDER — CLINDAMYCIN HCL 300 MG PO CAPS: 300.0000 mg | ORAL_CAPSULE | Freq: Four times a day (QID) | ORAL | 0 refills | Status: AC

## 2018-07-23 MED ORDER — CLINDAMYCIN HCL 300 MG PO CAPS
300.0000 mg | ORAL_CAPSULE | Freq: Four times a day (QID) | ORAL | Status: DC
  Administered 2018-07-23 – 2018-07-25 (×8): 300 mg via ORAL
  Filled 2018-07-23 (×10): qty 1

## 2018-07-23 MED ORDER — LIP MEDEX EX OINT: TOPICAL_OINTMENT | CUTANEOUS | Status: DC | PRN

## 2018-07-23 NOTE — Progress Notes (Signed)
PROGRESS NOTE    Steven Duffy  IDP:[id number] DOB: 07-27-1941 DOA: 07/18/2018 PCP: Patient, No Pcp Per     Brief Narrative:  Steven Duffy is a 77 year old male with extensive medical history including history of CVA, status post ICH in 2012, with subsequent left hemiparesis, seizures, blindness, hypertension, hyperlipidemia, CAD, PAF, history of pacemaker on chronic anticoagulation, congestive heart failure, depression, chronic anxiety, remote alcohol dependency, history of BPH, diabetes with mild neuropathy, stage III CKD, iron deficiency anemia, GERD, sleep apnea on CPAP, hypothyroidism, as well as a history of left heel wound. [facility] was [facility], due to left heel wound which have been worsening with pain and drainage. Patient has been seen by wound care and been treated with antibiotics however wound continues to worsen. Today at wound care evaluation patient was noted to be hypotensive, stated increasing pain and generalized weakness, wound care provider not this increase overall in green drainage not seen previously. Patient was sent to the emergency department for evaluation of sepsis. Upon ED evaluation patient was found to be hypotensive, lethargic, left heel wound with purulent drainage. X-ray shows evidence of osteomyelitis. Creatinine 1.5 at baseline.  Patient was admitted with working diagnosis of osteomyelitis. He was evaluated by orthopedic surgery and underwent debridement of left Achilles tendon and calcaneus and left heel ulceration on 8/11 with Dr. Berenice Primas.  New events last 24 hours / Subjective: No acute events.  Awaiting placement in skilled nursing facility.  Denies any new complaints today, denies any chest pain, nausea, vomiting, abdominal pain.  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 Berenice Primas in 2 weeks  -Nonweight bearing LLE, elevate leg as much as possible -Deescalate to Clindamycin for discharge (allergies to PCN and FQ), last day 8/19   Hx CVA -Continue lipitor  Paroxysmal A Fib -Continue pradaxa, digoxin, cardizem   DM  -Continue SSI, neurontin   Hypothyroidism -Continue synthroid   CKD stage III -Stable   Depression -Continue wellbutrin, lexapro    DVT prophylaxis: SCD Code Status: Full Family Communication: No family at bedside Disposition Plan: Pending SNF placement    Consultants:   Orthopedic surgery   Procedures:   S/p debridement of left Achilles tendon and calcaneus and left heel ulceration on 8/11 with Dr. Berenice Primas  Antimicrobials:  Anti-infectives (From admission, onward)   Start     Dose/Rate Route Frequency Ordered Stop   07/23/18 1400  clindamycin (CLEOCIN) capsule 300 mg     300 mg Oral Every 6 hours 07/23/18 1302     07/19/18 1600  vancomycin (VANCOCIN) IVPB 750 mg/150 ml premix  Status:  Discontinued     750 mg 150 mL/hr over 60 Minutes Intravenous Every 24 hours 07/18/18 1713 07/23/18 1303   07/19/18 0600  ceFEPIme (MAXIPIME) 1 g in sodium chloride 0.9 % 100 mL IVPB  Status:  Discontinued     1 g 200 mL/hr over 30 Minutes Intravenous Every 12 hours 07/18/18 1816 07/23/18 1303   07/18/18 1830  ceFEPIme (MAXIPIME) 2 g in sodium chloride 0.9 % 100 mL IVPB     2 g 200 mL/hr over 30 Minutes Intravenous  Once 07/18/18 1816 07/18/18 2148    07/18/18 1700  ceFEPIme (MAXIPIME) 2 g in sodium chloride 0.9 % 100 mL IVPB  Status:  Discontinued     2 g 200 mL/hr over 30 Minutes Intravenous  Once 07/18/18 1647 07/18/18 1816   07/18/18 1600  vancomycin (VANCOCIN) IVPB 1000 mg/200 mL premix     1,000 mg 200 mL/hr over 60 Minutes Intravenous  Once 07/18/18 1552 07/18/18 1733       Objective: Vitals:   07/22/18 0554 07/22/18 1438 07/22/18 2116 07/23/18 0506  BP:  95/63 97/64 120/68  Pulse:  65 67 65  Resp:  16 20 18   Temp:  98.4 F (36.9 C) 98.2 F (36.8 C) 99.1 F (37.3 C)  TempSrc:  Oral Oral Oral  SpO2:  95% 97% 99%  Weight: 79.7 kg   80.2 kg  Height:        Intake/Output Summary (Last 24 hours) at 07/23/2018 1306 Last data filed at 07/23/2018 1200 Gross per 24 hour  Intake 2877.68 ml  Output 4550 ml  Net -1672.32 ml   Filed Weights   07/21/18 0537 07/22/18 0554 07/23/18 0506  Weight: 76.5 kg 79.7 kg 80.2 kg    Examination:  General exam: Appears calm and comfortable  Respiratory system: Clear to auscultation. Respiratory effort normal. Cardiovascular system: S1 & S2 heard, RRR. No JVD, murmurs, rubs, gallops or clicks. No pedal edema. Gastrointestinal system: Abdomen is nondistended, soft and nontender. No organomegaly or masses felt. Normal bowel sounds heard. Central nervous system: Alert and oriented. No new focal deficits  Extremities: +LLE wrapped  Psychiatry: Judgement and insight appear normal. Mood & affect appropriate.   Data Reviewed: I have personally reviewed following labs and imaging studies  CBC: Recent Labs  Lab 07/18/18 1310 07/19/18 0528 07/20/18 0448 07/21/18 0523  WBC 9.4 7.4 7.4 6.7  NEUTROABS 7.1  --  5.4 5.2  HGB 13.7 11.7* 10.6* 9.4*  HCT 44.5 37.3* 34.5* 30.9*  MCV 93.5 93.3 93.0 94.2  PLT 273 235 240 932   Basic Metabolic Panel: Recent Labs  Lab 07/18/18 1310 07/19/18 0528 07/20/18 0448 07/22/18 0518  NA 141 141 142 141  K 3.3* 3.7 3.6 3.2*  CL 111 116* 117* 117*    CO2 22 20* 21* 18*  GLUCOSE 156* 100* 125* 134*  BUN 28* 23 24* 16  CREATININE 1.58* 1.33* 1.33* 1.15  CALCIUM 8.7* 8.0* 7.8* 7.4*   GFR: Estimated Creatinine Clearance: 55.4 mL/min (by C-G formula based on SCr of 1.15 mg/dL). Liver Function Tests: Recent Labs  Lab 07/18/18 1310  AST 19  ALT 14  ALKPHOS 113  BILITOT 0.4  PROT 7.1  ALBUMIN 3.2*   No results for input(s): LIPASE, AMYLASE in the last 168 hours. No results for input(s): AMMONIA in the last 168 hours. Coagulation Profile: Recent Labs  Lab 07/18/18 1310 07/20/18 0448  INR 1.49 1.51   Cardiac Enzymes: No results for input(s): CKTOTAL, CKMB, CKMBINDEX, TROPONINI in the last 168 hours. BNP (last 3 results) No results for input(s): PROBNP in the last 8760 hours. HbA1C: No results for input(s): HGBA1C in the last 72 hours. CBG: Recent Labs  Lab 07/22/18 1149 07/22/18 1716 07/22/18 2109  07/23/18 0747 07/23/18 1215  GLUCAP 182* 149* 207* 129* 138*   Lipid Profile: No results for input(s): CHOL, HDL, LDLCALC, TRIG, CHOLHDL, LDLDIRECT in the last 72 hours. Thyroid Function Tests: No results for input(s): TSH, T4TOTAL, FREET4, T3FREE, THYROIDAB in the last 72 hours. Anemia Panel: No results for input(s): VITAMINB12, FOLATE, FERRITIN, TIBC, IRON, RETICCTPCT in the last 72 hours. Sepsis Labs: Recent Labs  Lab 07/18/18 1417  LATICACIDVEN 1.26    Recent Results (from the past 240 hour(s))  Culture, blood (Routine x 2)     Status: None (Preliminary result)   Collection Time: 07/18/18  1:10 PM  Result Value Ref Range Status   Specimen Description   Final    BLOOD RIGHT ANTECUBITAL Performed at [facility] [address], [place], [place] [address]    Special Requests   Final    BOTTLES DRAWN AEROBIC AND ANAEROBIC Blood Culture adequate volume Performed at [facility] [address] [place], [place] [address]    Culture   Final    NO GROWTH 4 DAYS Performed  at [facility], [place] [address] [place], [place] [address]    Report Status PENDING  Incomplete  Culture, blood (Routine x 2)     Status: None (Preliminary result)   Collection Time: 07/18/18  4:16 PM  Result Value Ref Range Status   Specimen Description   Final    BLOOD LEFT ANTECUBITAL Performed at [facility] [address] [place], [place] [address]    Special Requests   Final    BOTTLES DRAWN AEROBIC AND ANAEROBIC Blood Culture adequate volume Performed at [facility] [address], [place], [place] [address]    Culture   Final    NO GROWTH 4 DAYS Performed at [facility], [place] [address], [place], [place] [address]    Report Status PENDING  Incomplete  Surgical pcr screen     Status: None   Collection Time: 07/19/18  8:44 PM  Result Value Ref Range Status   MRSA, PCR NEGATIVE NEGATIVE Final   Staphylococcus aureus NEGATIVE NEGATIVE Final    Comment: (NOTE) The Xpert SA Assay (FDA approved for NASAL specimens in patients 27 years of age and older), is one component of a comprehensive surveillance program. It is not intended to diagnose infection nor to guide or monitor treatment. Performed at [facility], [place] [address] [place], [place] [address]        Radiology Studies: No results found.    Scheduled Meds: . atorvastatin  10 mg Oral Daily  . atropine  1 drop Right Eye Daily  . buPROPion  150 mg Oral q morning - 10a  . clindamycin  300 mg Oral Q6H  . dabigatran  150 mg Oral Q12H  . digoxin  0.0625 mg Oral q morning - 10a  . diltiazem  180 mg Oral q morning - 10a  . docusate sodium  100 mg Oral BID  . dorzolamide  1 drop Right Eye BID   And  . timolol  1 drop Right Eye BID  . escitalopram  20 mg Oral q morning - 10a  . famotidine  20 mg Oral BID  . feeding supplement  1 Container Oral TID WC  . furosemide  40 mg Oral Daily  . gabapentin  300 mg Oral TID  . insulin aspart  0-9  Units Subcutaneous TID WC  . latanoprost  1 drop Right Eye  QHS  . levothyroxine  37.5 mcg Oral QAC breakfast  . pilocarpine  2 drop Right Eye QID  . tamsulosin  0.4 mg Oral QPM   Continuous Infusions:    LOS: 5 days    Time spent: 35 minutes   Dessa Phi, DO [facility] [email or web address] Password [email or web address] 07/23/2018, 1:06 PM

## 2018-07-23 NOTE — Progress Notes (Signed)
Pt's Passr number pending- has been assigned to [facility] for review by [place] [facility]. CSW available to assist as needed. Updated SNF ([facility]) on status.  Sharren Bridge, MSW, LCSW Clinical Social Work 07/23/2018 [phone]

## 2018-07-23 NOTE — Progress Notes (Addendum)
Pt stated he wants to "skip cpap tonight".  Pt was encouraged to call should he change his mind.  RN aware, present in room.

## 2018-07-24 DIAGNOSIS — M869 Osteomyelitis, unspecified: Secondary | ICD-10-CM

## 2018-07-24 DIAGNOSIS — M86072 Acute hematogenous osteomyelitis, left ankle and foot: Secondary | ICD-10-CM

## 2018-07-24 LAB — GLUCOSE, CAPILLARY
GLUCOSE-CAPILLARY: 119 mg/dL — AB (ref 70–99)
GLUCOSE-CAPILLARY: 212 mg/dL — AB (ref 70–99)
Glucose-Capillary: 150 mg/dL — ABNORMAL HIGH (ref 70–99)
Glucose-Capillary: 117 mg/dL — ABNORMAL HIGH (ref 70–99)

## 2018-07-24 MED FILL — Nutritional Supplement Liquid: ORAL | Qty: 1 | Status: AC

## 2018-07-24 NOTE — Progress Notes (Signed)
PROGRESS NOTE    Steven Duffy  KDT:[id number] DOB: 1941/02/19 DOA: 07/18/2018 PCP: Janifer Adie, MD     Brief Narrative:  Steven Duffy is a 77 year old male with extensive medical history including history of CVA, status post ICH in 2012, with subsequent left hemiparesis, seizures, blindness, hypertension, hyperlipidemia, CAD, PAF, history of pacemaker on chronic anticoagulation, congestive heart failure, depression, chronic anxiety, remote alcohol dependency, history of BPH, diabetes with mild neuropathy, stage III CKD, iron deficiency anemia, GERD, sleep apnea on CPAP, hypothyroidism, as well as a history of left heel wound. Patient was sent from [facility], due to left heel wound which have been worsening with pain and drainage. Patient has been seen by wound care and been treated with antibiotics however wound continues to worsen. Today at wound care evaluation patient was noted to be hypotensive, stated increasing pain and generalized weakness, wound care provider not this increase overall in green drainage not seen previously. Patient was sent to the emergency department for evaluation of sepsis. Upon ED evaluation patient was found to be hypotensive, lethargic, left heel wound with purulent drainage. X-ray shows evidence of osteomyelitis. Creatinine 1.5 at baseline.  Patient was admitted with working diagnosis of osteomyelitis. He was evaluated by orthopedic surgery and underwent debridement of left Achilles tendon and calcaneus and left heel ulceration on 8/11 with Dr. Berenice Primas.  New events last 24 hours / Subjective: Had couple episodes of loose stools without abdominal cramping/pain. No other complaints today.   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 Berenice Primas in 2 weeks  -Nonweight bearing LLE, elevate leg as much as possible -Deescalate to Clindamycin for discharge (allergies to PCN and FQ), last day 8/19   Hx CVA -Continue lipitor  Paroxysmal A Fib -Continue pradaxa, digoxin, cardizem   DM  -Continue SSI, neurontin   Hypothyroidism -Continue synthroid   CKD stage III -Stable   Depression -Continue wellbutrin, lexapro    DVT prophylaxis: SCD Code Status: Full Family Communication: Wife at bedside Disposition Plan: Pending SNF placement    Consultants:   Orthopedic surgery   Procedures:   S/p debridement of left Achilles tendon and calcaneus and left heel ulceration on 8/11 with Dr. Berenice Primas  Antimicrobials:  Anti-infectives (From admission, onward)   Start     Dose/Rate Route Frequency Ordered Stop   07/23/18 1400  clindamycin (CLEOCIN) capsule 300 mg     300 mg Oral Every 6 hours 07/23/18 1302     07/23/18 0000  clindamycin (CLEOCIN) 300 MG capsule     300 mg Oral Every 6 hours 07/23/18 1321 07/28/18 2359   07/19/18 1600  vancomycin (VANCOCIN) IVPB 750 mg/150 ml premix  Status:  Discontinued     750 mg 150 mL/hr over 60 Minutes Intravenous Every 24 hours 07/18/18 1713 07/23/18 1303   07/19/18 0600  ceFEPIme (MAXIPIME) 1 g in sodium chloride 0.9 % 100 mL IVPB  Status:  Discontinued     1 g 200 mL/hr over 30 Minutes Intravenous Every 12 hours 07/18/18 1816 07/23/18 1303   07/18/18 1830  ceFEPIme (MAXIPIME) 2 g in sodium chloride 0.9 % 100 mL IVPB     2 g 200  mL/hr over 30 Minutes Intravenous  Once 07/18/18 1816 07/18/18 2148   07/18/18 1700  ceFEPIme (MAXIPIME) 2 g in sodium chloride 0.9 % 100 mL IVPB  Status:  Discontinued     2 g 200 mL/hr over 30 Minutes Intravenous  Once 07/18/18 1647 07/18/18 1816   07/18/18 1600  vancomycin (VANCOCIN) IVPB 1000 mg/200 mL premix     1,000 mg 200 mL/hr over 60 Minutes Intravenous  Once 07/18/18 1552 07/18/18 1733       Objective: Vitals:   07/23/18 2107 07/24/18 0355 07/24/18 0536 07/24/18 1308  BP: 132/63  126/72 (!) 109/57  Pulse: 64  66 65  Resp: 18  16 18   Temp: 98.2 F (36.8 C)  98.3 F (36.8 C) 98.3 F (36.8 C)  TempSrc: Oral  Oral Oral  SpO2: 98%  99% 100%  Weight:  78.1 kg    Height:        Intake/Output Summary (Last 24 hours) at 07/24/2018 1524 Last data filed at 07/24/2018 0817 Gross per 24 hour  Intake 840 ml  Output 1700 ml  Net -860 ml   Filed Weights   07/22/18 0554 07/23/18 0506 07/24/18 0355  Weight: 79.7 kg 80.2 kg 78.1 kg    Examination: General exam: Appears calm and comfortable  Respiratory system: Clear to auscultation. Respiratory effort normal. Cardiovascular system: S1 & S2 heard, RRR. No JVD, murmurs, rubs, gallops or clicks. No pedal edema. Gastrointestinal system: Abdomen is nondistended, soft and nontender. No organomegaly or masses felt. Normal bowel sounds heard. Central nervous system: Alert and oriented Extremities: Left lower leg wrapped in dry dressing  Psychiatry: Judgement and insight appear normal. Mood & affect appropriate.    Data Reviewed: I have personally reviewed following labs and imaging studies  CBC: Recent Labs  Lab 07/18/18 1310 07/19/18 0528 07/20/18 0448 07/21/18 0523  WBC 9.4 7.4 7.4 6.7  NEUTROABS 7.1  --  5.4 5.2  HGB 13.7 11.7* 10.6* 9.4*  HCT 44.5 37.3* 34.5* 30.9*  MCV 93.5 93.3 93.0 94.2  PLT 273 235 240 659   Basic Metabolic Panel: Recent Labs  Lab 07/18/18 1310 07/19/18 0528 07/20/18 0448 07/22/18 0518    NA 141 141 142 141  K 3.3* 3.7 3.6 3.2*  CL 111 116* 117* 117*  CO2 22 20* 21* 18*  GLUCOSE 156* 100* 125* 134*  BUN 28* 23 24* 16  CREATININE 1.58* 1.33* 1.33* 1.15  CALCIUM 8.7* 8.0* 7.8* 7.4*   GFR: Estimated Creatinine Clearance: 51.1 mL/min (by C-G formula based on SCr of 1.15 mg/dL). Liver Function Tests: Recent Labs  Lab 07/18/18 1310  AST 19  ALT 14  ALKPHOS 113  BILITOT 0.4  PROT 7.1  ALBUMIN 3.2*   No results for input(s): LIPASE, AMYLASE in the last 168 hours. No results for input(s): AMMONIA in the last 168 hours. Coagulation Profile: Recent Labs  Lab 07/18/18 1310 07/20/18 0448  INR 1.49 1.51   Cardiac Enzymes: No results for input(s): CKTOTAL, CKMB, CKMBINDEX, TROPONINI in the last 168 hours. BNP (last 3 results) No results for input(s): PROBNP in the last 8760 hours. HbA1C: No results for input(s): HGBA1C in  the last 72 hours. CBG: Recent Labs  Lab 07/23/18 1215 07/23/18 1638 07/23/18 2109 07/24/18 0712 07/24/18 1157  GLUCAP 138* 136* 124* 119* 212*   Lipid Profile: No results for input(s): CHOL, HDL, LDLCALC, TRIG, CHOLHDL, LDLDIRECT in the last 72 hours. Thyroid Function Tests: No results for input(s): TSH, T4TOTAL, FREET4, T3FREE, THYROIDAB in the last 72 hours. Anemia Panel: No results for input(s): VITAMINB12, FOLATE, FERRITIN, TIBC, IRON, RETICCTPCT in the last 72 hours. Sepsis Labs: Recent Labs  Lab 07/18/18 1417  LATICACIDVEN 1.26    Recent Results (from the past 240 hour(s))  Culture, blood (Routine x 2)     Status: None   Collection Time: 07/18/18  1:10 PM  Result Value Ref Range Status   Specimen Description   Final    BLOOD RIGHT ANTECUBITAL Performed at [facility] [address] [place], [place] [address]    Special Requests   Final    BOTTLES DRAWN AEROBIC AND ANAEROBIC Blood Culture adequate volume Performed at [facility] [address], [place], [place] [address]     Culture   Final    NO GROWTH 5 DAYS Performed at [facility], [place] [address] [place], [place] [address]    Report Status 07/23/2018 FINAL  Final  Culture, blood (Routine x 2)     Status: None   Collection Time: 07/18/18  4:16 PM  Result Value Ref Range Status   Specimen Description   Final    BLOOD LEFT ANTECUBITAL Performed at [facility] [address], [place], [place] [address]    Special Requests   Final    BOTTLES DRAWN AEROBIC AND ANAEROBIC Blood Culture adequate volume Performed at [facility] [address], [place], [place] [address]    Culture   Final    NO GROWTH 5 DAYS Performed at [facility], [place] [address] [place], [place] [address]    Report Status 07/23/2018 FINAL  Final  Surgical pcr screen     Status: None   Collection Time: 07/19/18  8:44 PM  Result Value Ref Range Status   MRSA, PCR NEGATIVE NEGATIVE Final   Staphylococcus aureus NEGATIVE NEGATIVE Final    Comment: (NOTE) The Xpert SA Assay (FDA approved for NASAL specimens in patients 25 years of age and older), is one component of a comprehensive surveillance program. It is not intended to diagnose infection nor to guide or monitor treatment. Performed at [facility], [place] [address] [place], [place] [address]        Radiology Studies: No results found.    Scheduled Meds: . atorvastatin  10 mg Oral Daily  . atropine  1 drop Right Eye Daily  . buPROPion  150 mg Oral q morning - 10a  . clindamycin  300 mg Oral Q6H  . dabigatran  150 mg Oral Q12H  . digoxin  0.0625 mg Oral q morning - 10a  . diltiazem  180 mg Oral q morning - 10a  . docusate sodium  100 mg Oral BID  . dorzolamide  1 drop Right Eye BID   And  . timolol  1 drop Right Eye BID  . escitalopram  20 mg Oral q morning - 10a  . famotidine  20 mg Oral BID  . feeding supplement  1 Container Oral TID WC  . furosemide  40 mg Oral Daily  . gabapentin  300 mg  Oral TID  . insulin aspart  0-9 Units Subcutaneous TID WC  . latanoprost  1 drop Right Eye QHS  . levothyroxine  37.5 mcg Oral QAC breakfast  . pilocarpine  2 drop Right Eye QID  . tamsulosin  0.4 mg Oral QPM   Continuous Infusions:    LOS: 6 days    Time spent: 20 minutes   Dessa Phi, DO [facility] [email or web address] Password [facility] 07/24/2018, 3:24 PM

## 2018-07-24 NOTE — Discharge Summary (Addendum)
Physician Discharge Summary  Steven Duffy:[id number] DOB: 1941/02/09 DOA: 07/18/2018  PCP: Janifer Adie, MD  Admit date: 07/18/2018 Discharge date: 07/25/2018  Admitted From: Home Disposition:  SNF   Recommendations for Outpatient Follow-up:  1. Follow up with PCP in 1 week 2. Follow up with Dr. Berenice Primas in 2 weeks  3. Nonweight bearing LLE, elevate leg as much as possible  Discharge Condition: Stable CODE STATUS: Full  Diet recommendation: Heart healthy/carb modified   Brief/Interim Summary: Steven Duffy is a 77 year old male with extensive medical history including history of CVA, status post ICH in 2012, with subsequent left hemiparesis, seizures, blindness, hypertension, hyperlipidemia, CAD, PAF, history of pacemaker on chronic anticoagulation, congestive heart failure, depression, chronic anxiety, remote alcohol dependency, history of BPH, diabetes with mild neuropathy, stage III CKD, iron deficiency anemia, GERD, sleep apnea on CPAP, hypothyroidism, as well as a history of left heel wound. Patient was sent from [facility], due to left heel wound which have been worsening with pain and drainage. Patient has been seen by wound care and been treated with antibiotics however wound continues to worsen. Today at wound care evaluation patient was noted to be hypotensive, stated increasing pain and generalized weakness, wound care provider not this increase overall in green drainage not seen previously. Patient was sent to the emergency department for evaluation of sepsis. Upon ED evaluation patient was found to be hypotensive, lethargic, left heel wound with purulent drainage. X-ray shows evidence of osteomyelitis. Creatinine 1.5 at baseline. Patient was admitted with working diagnosis of osteomyelitis. He was evaluated by orthopedic surgery and underwent debridement of left Achilles tendon and calcaneus and left heel ulceration on 8/11 with Dr. Berenice Primas.  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 Berenice Primas in 2 weeks  -Nonweight bearing LLE, elevate leg as much as possible -Deescalate to Clindamycin for discharge (allergies to PCN and FQ), last day 8/19   Hx CVA -Continue lipitor  Paroxysmal A Fib -Continue pradaxa, digoxin, cardizem   DM  -Continue SSI, neurontin   Hypothyroidism -Continue synthroid   CKD stage III -Stable   Depression -Continue wellbutrin, lexapro   Hypokalemia -Replace, trend    Discharge Instructions  Discharge Instructions    Call MD for:  difficulty breathing, headache or visual disturbances   Complete by:  As directed    Call MD for:  extreme fatigue   Complete by:  As directed    Call MD for:  hives   Complete by:  As directed    Call MD for:  persistant dizziness or light-headedness   Complete by:  As directed    Call MD for:  persistant nausea and vomiting   Complete by:  As directed    Call MD for:  redness, tenderness, or signs of infection (pain, swelling, redness, odor or green/yellow discharge around  incision site)   Complete by:  As directed    Call MD for:  severe uncontrolled pain   Complete by:  As directed    Call MD for:  temperature >100.4   Complete by:  As directed    Diet - low sodium heart healthy   Complete by:  As  directed    Diet Carb Modified   Complete by:  As directed    Discharge instructions   Complete by:  As directed    You were cared for by a hospitalist during your hospital stay. If you have any questions about your discharge medications or the care you received while you were in the hospital after you are discharged, you can call the unit and ask to speak with the hospitalist on call if the hospitalist that took care of you is not available. Once you are discharged, your primary care physician will handle any further medical issues. Please note that NO REFILLS for any discharge medications will be authorized once you are discharged, as it is imperative that you return to your primary care physician (or establish a relationship with a primary care physician if you do not have one) for your aftercare needs so that they can reassess your need for medications and monitor your lab values.   Increase activity slowly   Complete by:  As directed    Leave dressing on - Keep it clean, dry, and intact until clinic visit   Complete by:  As directed      Allergies as of 07/25/2018      Reactions   Duloxetine Nausea And Vomiting, Other (See Comments)   depression   Iodine Swelling   Lyrica [pregabalin] Other (See Comments)   Depression, suicidal    Shellfish-derived Products Swelling   Procaine Other (See Comments)   unspecified   Ciprofloxacin Rash   Penicillins Rash   CHILDHOOD ALLERGY Has patient had a PCN reaction causing immediate rash, facial/tongue/throat swelling, SOB or lightheadedness with hypotension: YES Has patient had a PCN reaction causing severe rash involving mucus membranes or skin necrosis: No Has patient had a PCN reaction that required hospitalization NO Has patient had a PCN reaction occurring within the last 10 years: NO  If all of the above answers are "NO", then may proceed with Cephalosporin use.   Tape Rash   NOT paper tape      Medication List    STOP taking these  medications   metoprolol tartrate 25 MG tablet Commonly known as:  LOPRESSOR   traMADol 50 MG tablet Commonly known as:  ULTRAM     TAKE these medications   acetaminophen 500 MG tablet Commonly known as:  TYLENOL Take 500-1,000 mg by mouth every 4 (four) hours as needed for mild pain.   acetaZOLAMIDE 250 MG tablet Commonly known as:  DIAMOX Take 250 mg by mouth 3 (three) times daily.   atorvastatin 10 MG tablet Commonly known as:  LIPITOR Take 10 mg by mouth daily.   AVEENO DAILY MOISTURIZER Lotn Apply 1 application topically 2 (two) times daily. To lower extremities.   buPROPion 150 MG 24 hr tablet Commonly known as:  WELLBUTRIN XL Take 150 mg by mouth every morning.   calcium citrate 950 MG tablet Commonly known as:  CALCITRATE - dosed in mg elemental calcium Take 400 mg of elemental calcium by mouth 2 (two) times daily.   clindamycin 300 MG capsule Commonly known as:  CLEOCIN Take 1 capsule (300 mg total) by mouth  every 6 (six) hours for 5 days.   collagenase ointment Commonly known as:  SANTYL Apply 1 application topically daily.   dabigatran 150 MG Caps capsule Commonly known as:  PRADAXA Take 150 mg by mouth 2 (two) times daily.   digoxin 0.125 MG tablet Commonly known as:  LANOXIN Take 0.0625 mg by mouth every morning.   diltiazem 180 MG 24 hr capsule Commonly known as:  DILACOR XR Take 180 mg by mouth every morning.   diphenhydrAMINE 25 MG tablet Commonly known as:  SOMINEX Take 25 mg by mouth once. Take 2 capsules by mouth 1 hour prior to surgery   docusate sodium 250 MG capsule Commonly known as:  COLACE Take 250 mg by mouth daily.   dorzolamidel-timolol 22.3-6.8 MG/ML Soln ophthalmic solution Commonly known as:  COSOPT Place 1 drop into the right eye 2 (two) times daily. For glaucoma   escitalopram 20 MG tablet Commonly known as:  LEXAPRO Take 20 mg by mouth every morning.   finasteride 5 MG tablet Commonly known as:  PROSCAR Take 5 mg  by mouth every morning.   furosemide 40 MG tablet Commonly known as:  LASIX Take 40 mg by mouth daily.   gabapentin 300 MG capsule Commonly known as:  NEURONTIN Take 300 mg by mouth 2 (two) times daily.   HYDROcodone-acetaminophen 5-325 MG tablet Commonly known as:  NORCO/VICODIN Take 1-2 tablets by mouth every 4 (four) hours as needed for up to 7 days for moderate pain.   levothyroxine 75 MCG tablet Commonly known as:  SYNTHROID, LEVOTHROID Take 37.5 mcg by mouth daily before breakfast.   LUMIGAN 0.01 % Soln Generic drug:  bimatoprost Place 1 drop into the right eye at bedtime as needed (glaucoma).   magnesium oxide 400 MG tablet Commonly known as:  MAG-OX Take 400 mg by mouth every morning.   MIRALAX PO Take 17 g by mouth daily as needed (constipation).   PERIGUARD Oint Apply 1 application topically daily as needed. TO AFFECTED SITE (FOR RASH)   predniSONE 50 MG tablet Commonly known as:  DELTASONE Take 50 mg by mouth once. Pt to take 1 tab 13 hours, 7 hours and 1 hour before right percutaneous nephrostolithotomy as directed   PROMOD Liqd Take 30 mLs by mouth 3 (three) times daily with meals.   ranitidine 150 MG tablet Commonly known as:  ZANTAC Take 150 mg by mouth at bedtime.   tamsulosin 0.4 MG Caps capsule Commonly known as:  FLOMAX Take 0.4 mg by mouth every evening. FOR URINARY SYMPTOMS      Follow-up Information    Dorna Leitz, MD. Schedule an appointment as soon as possible for a visit in 2 week(s).   Specialty:  Orthopedic Surgery Contact information: [place] [address] [phone]        Janifer Adie, MD. Schedule an appointment as soon as possible for a visit in 1 week(s).   Specialty:  Family Medicine Contact information: [employer] [address] [phone]          Allergies  Allergen Reactions  . Duloxetine Nausea And Vomiting and Other (See Comments)    depression  . Iodine Swelling  .  Lyrica [Pregabalin] Other (See Comments)    Depression, suicidal   . Shellfish-Derived Products Swelling  . Procaine Other (See Comments)    unspecified  . Ciprofloxacin Rash  . Penicillins Rash    CHILDHOOD ALLERGY Has patient had a PCN reaction causing immediate rash, facial/tongue/throat  swelling, SOB or lightheadedness with hypotension: YES Has patient had a PCN reaction causing severe rash involving mucus membranes or skin necrosis: No Has patient had a PCN reaction that required hospitalization NO Has patient had a PCN reaction occurring within the last 10 years: NO  If all of the above answers are "NO", then may proceed with Cephalosporin use.   . Tape Rash    NOT paper tape    Consultations:  Orthopedic surgery    Procedures/Studies: Dg Foot Complete Left  Result Date: 07/18/2018 CLINICAL DATA:  Pt sent form Wound Center for increased wound infection, weakness and low BP. Wife states that pt reports waking up today feeling like he has the flu and felt bad all over. Pt is currently on doxycycline for possible cellulitis. Paperwork from wound center states that pt having purulent drainage from left foot wound EXAM: LEFT FOOT - COMPLETE 3+ VIEW COMPARISON:  04/17/2018 FINDINGS: No fracture. Small area of loss of the normal white cortical line along the plantar margin of the calcaneal tuberosity consistent with osteomyelitis. No other evidence of osteomyelitis. Moderate loss joint space with subchondral sclerosis and small marginal osteophytes at the first metatarsophalangeal joint. Remaining joints normally spaced and aligned. Soft tissue wound is seen along the posterior margin of the he will adjacent to the calcaneal tuberosity. IMPRESSION: 1. Findings consistent with a small focus of osteomyelitis along the plantar margin of the calcaneal tuberosity. No other evidence of osteomyelitis. No fracture or dislocation. Electronically Signed   By: Lajean Manes M.D.   On: 07/18/2018 15:14       Discharge Exam: Vitals:   07/25/18 0544 07/25/18 0913  BP: 109/61 (!) 97/57  Pulse: 69 65  Resp: 14 20  Temp: 98.3 F (36.8 C)   SpO2: 96% 98%    General: Pt is alert, awake, not in acute distress Cardiovascular: RRR, S1/S2 +, no rubs, no gallops Respiratory: CTA bilaterally, no wheezing, no rhonchi Abdominal: Soft, NT, ND, bowel sounds + Extremities: no edema, no cyanosis    The results of significant diagnostics from this hospitalization (including imaging, microbiology, ancillary and laboratory) are listed below for reference.     Microbiology: Recent Results (from the past 240 hour(s))  Culture, blood (Routine x 2)     Status: None   Collection Time: 07/18/18  1:10 PM  Result Value Ref Range Status   Specimen Description   Final    BLOOD RIGHT ANTECUBITAL Performed at [facility] [address] [place], [place] [address]    Special Requests   Final    BOTTLES DRAWN AEROBIC AND ANAEROBIC Blood Culture adequate volume Performed at [facility] [address] [place], [place] [address]    Culture   Final    NO GROWTH 5 DAYS Performed at [facility], [place] [address], [place], [place] [address]    Report Status 07/23/2018 FINAL  Final  Culture, blood (Routine x 2)     Status: None   Collection Time: 07/18/18  4:16 PM  Result Value Ref Range Status   Specimen Description   Final    BLOOD LEFT ANTECUBITAL Performed at [facility] [address], [place], [place] [address]    Special Requests   Final    BOTTLES DRAWN AEROBIC AND ANAEROBIC Blood Culture adequate volume Performed at [facility] [address] [place],  [address]    Culture   Final    NO GROWTH 5 DAYS  Performed at [facility], [place] [address], [place], [place] [address]    Report Status 07/23/2018 FINAL  Final  Surgical pcr screen     Status: None   Collection Time: 07/19/18  8:44 PM   Result Value Ref Range Status   MRSA, PCR NEGATIVE NEGATIVE Final   Staphylococcus aureus NEGATIVE NEGATIVE Final    Comment: (NOTE) The Xpert SA Assay (FDA approved for NASAL specimens in patients 13 years of age and older), is one component of a comprehensive surveillance program. It is not intended to diagnose infection nor to guide or monitor treatment. Performed at [facility], [place] [address], [place], [place] [address]      Labs: BNP (last 3 results) No results for input(s): BNP in the last 8760 hours. Basic Metabolic Panel: Recent Labs  Lab 07/18/18 1310 07/19/18 0528 07/20/18 0448 07/22/18 0518 07/25/18 0408  NA 141 141 142 141 143  K 3.3* 3.7 3.6 3.2* 3.0*  CL 111 116* 117* 117* 115*  CO2 22 20* 21* 18* 22  GLUCOSE 156* 100* 125* 134* 139*  BUN 28* 23 24* 16 13  CREATININE 1.58* 1.33* 1.33* 1.15 1.04  CALCIUM 8.7* 8.0* 7.8* 7.4* 7.6*   Liver Function Tests: Recent Labs  Lab 07/18/18 1310  AST 19  ALT 14  ALKPHOS 113  BILITOT 0.4  PROT 7.1  ALBUMIN 3.2*   No results for input(s): LIPASE, AMYLASE in the last 168 hours. No results for input(s): AMMONIA in the last 168 hours. CBC: Recent Labs  Lab 07/18/18 1310 07/19/18 0528 07/20/18 0448 07/21/18 0523 07/25/18 0408  WBC 9.4 7.4 7.4 6.7 6.2  NEUTROABS 7.1  --  5.4 5.2  --   HGB 13.7 11.7* 10.6* 9.4* 9.6*  HCT 44.5 37.3* 34.5* 30.9* 30.2*  MCV 93.5 93.3 93.0 94.2 89.6  PLT 273 235 240 216 271   Cardiac Enzymes: No results for input(s): CKTOTAL, CKMB, CKMBINDEX, TROPONINI in the last 168 hours. BNP: Invalid input(s): POCBNP CBG: Recent Labs  Lab 07/24/18 0712 07/24/18 1157 07/24/18 1624 07/24/18 2135 07/25/18 0730  GLUCAP 119* 212* 150* 117* 115*   D-Dimer No results for input(s): DDIMER in the last 72 hours. Hgb A1c No results for input(s): HGBA1C in the last 72 hours. Lipid Profile No results for input(s): CHOL, HDL, LDLCALC, TRIG, CHOLHDL, LDLDIRECT in the  last 72 hours. Thyroid function studies No results for input(s): TSH, T4TOTAL, T3FREE, THYROIDAB in the last 72 hours.  Invalid input(s): FREET3 Anemia work up No results for input(s): VITAMINB12, FOLATE, FERRITIN, TIBC, IRON, RETICCTPCT in the last 72 hours. Urinalysis    Component Value Date/Time   COLORURINE YELLOW 07/21/2018 0543   APPEARANCEUR CLEAR 07/21/2018 0543   LABSPEC 1.006 07/21/2018 0543   PHURINE 5.0 07/21/2018 0543   GLUCOSEU NEGATIVE 07/21/2018 0543   HGBUR NEGATIVE 07/21/2018 0543   BILIRUBINUR NEGATIVE 07/21/2018 0543   KETONESUR NEGATIVE 07/21/2018 0543   PROTEINUR NEGATIVE 07/21/2018 0543   UROBILINOGEN 2.0 (H) 02/24/2011 1719   NITRITE NEGATIVE 07/21/2018 0543   LEUKOCYTESUR NEGATIVE 07/21/2018 0543   Sepsis Labs Invalid input(s): PROCALCITONIN,  WBC,  LACTICIDVEN Microbiology Recent Results (from the past 240 hour(s))  Culture, blood (Routine x 2)     Status: None   Collection Time: 07/18/18  1:10 PM  Result Value Ref Range Status   Specimen Description   Final    BLOOD RIGHT ANTECUBITAL Performed at [facility], [place] [address], [place], [place] [address]  Special Requests   Final    BOTTLES DRAWN AEROBIC AND ANAEROBIC Blood Culture adequate volume Performed at [facility] [address] [place], [place] [address]    Culture   Final    NO GROWTH 5 DAYS Performed at [facility], [place] [address] [place], [place] [address]    Report Status 07/23/2018 FINAL  Final  Culture, blood (Routine x 2)     Status: None   Collection Time: 07/18/18  4:16 PM  Result Value Ref Range Status   Specimen Description   Final    BLOOD LEFT ANTECUBITAL Performed at [facility] [address] [place], [place] [address]    Special Requests   Final    BOTTLES DRAWN AEROBIC AND ANAEROBIC Blood Culture adequate volume Performed at [facility] [address] [place], [place]  [address]    Culture   Final    NO GROWTH 5 DAYS Performed at [facility], [place] [address], [place], [place] [address]    Report Status 07/23/2018 FINAL  Final  Surgical pcr screen     Status: None   Collection Time: 07/19/18  8:44 PM  Result Value Ref Range Status   MRSA, PCR NEGATIVE NEGATIVE Final   Staphylococcus aureus NEGATIVE NEGATIVE Final    Comment: (NOTE) The Xpert SA Assay (FDA approved for NASAL specimens in patients 40 years of age and older), is one component of a comprehensive surveillance program. It is not intended to diagnose infection nor to guide or monitor treatment. Performed at [facility], [place] [address], [place],  [address]      Patient was seen and examined on the day of discharge and was found to be in stable condition. Time coordinating discharge: 35 minutes including assessment and coordination of care, as well as examination of the patient.   SIGNED:  Dessa Phi, DO [facility] Pager [phone]  If 7PM-7AM, please contact night-coverage [email or web address] Password [email or web address] 07/25/2018, 10:10 AM

## 2018-07-25 ENCOUNTER — Inpatient Hospital Stay (HOSPITAL_COMMUNITY): Payer: Medicare (Managed Care)

## 2018-07-25 DIAGNOSIS — M86172 Other acute osteomyelitis, left ankle and foot: Secondary | ICD-10-CM

## 2018-07-25 LAB — BASIC METABOLIC PANEL
ANION GAP: 6 (ref 5–15)
BUN: 13 mg/dL (ref 8–23)
CALCIUM: 7.6 mg/dL — AB (ref 8.9–10.3)
CO2: 22 mmol/L (ref 22–32)
Chloride: 115 mmol/L — ABNORMAL HIGH (ref 98–111)
Creatinine, Ser: 1.04 mg/dL (ref 0.61–1.24)
GFR calc Af Amer: >60 mL/min (ref >60)
GLUCOSE: 139 mg/dL — AB (ref 70–99)
Potassium: 3 mmol/L — ABNORMAL LOW (ref 3.5–5.1)
Sodium: 143 mmol/L (ref 135–145)

## 2018-07-25 LAB — CBC
HEMATOCRIT: 30.2 % — AB (ref 39.0–52.0)
Hemoglobin: 9.6 g/dL — ABNORMAL LOW (ref 13.0–17.0)
MCH: 28.5 pg (ref 26.0–34.0)
MCHC: 31.8 g/dL (ref 30.0–36.0)
MCV: 89.6 fL (ref 78.0–100.0)
PLATELETS: 271 10*3/uL (ref 150–400)
RBC: 3.37 MIL/uL — ABNORMAL LOW (ref 4.22–5.81)
RDW: 15 % (ref 11.5–15.5)
WBC: 6.2 10*3/uL (ref 4.0–10.5)

## 2018-07-25 LAB — GLUCOSE, CAPILLARY: Glucose-Capillary: 115 mg/dL — ABNORMAL HIGH (ref 70–99)

## 2018-07-25 MED ORDER — POTASSIUM CHLORIDE CRYS ER 20 MEQ PO TBCR
40.0000 meq | EXTENDED_RELEASE_TABLET | ORAL | Status: AC
  Administered 2018-07-25 (×2): 40 meq via ORAL
  Filled 2018-07-25 (×2): qty 2

## 2018-07-25 NOTE — Progress Notes (Signed)
Received approved pasrr #[id number] F expiration date 08/24/18. Pt admitting to [facility] report [phone]. DC information provided via the [facility]. Working to arrange transportation ([facility]). Updated pt's wife.  Sharren Bridge, MSW, LCSW Clinical Social Work 07/25/2018 [phone]

## 2018-07-25 NOTE — Care Management Important Message (Signed)
Important Message  Patient Details  Name: Steven Duffy MRN: [id number] Date of Birth: [birth year]   Medicare Important Message Given:  Yes    Kerin Salen 07/25/2018, 10:37 AMImportant Message  Patient Details  Name: Steven Duffy MRN: [id number] Date of Birth: 01/04/1941   Medicare Important Message Given:  Yes    Kerin Salen 07/25/2018, 10:37 AM

## 2018-07-25 NOTE — Progress Notes (Signed)
07/25/18  1007  Notified MD that pt c/o feeling SOB. VS stable.

## 2018-07-25 NOTE — Progress Notes (Addendum)
07/25/18  1237  Called report to [facility]. Report given to [facility].

## 2018-07-25 NOTE — Plan of Care (Signed)
  Problem: Elimination: Goal: Will not experience complications related to bowel motility Outcome: Progressing   Problem: Safety: Goal: Ability to remain free from injury will improve Outcome: Progressing   Problem: Pain Managment: Goal: General experience of comfort will improve Outcome: Progressing

## 2018-07-28 ENCOUNTER — Encounter: Payer: Self-pay | Admitting: Internal Medicine

## 2018-07-28 NOTE — Progress Notes (Signed)
Opened in error

## 2018-08-18 ENCOUNTER — Encounter (HOSPITAL_BASED_OUTPATIENT_CLINIC_OR_DEPARTMENT_OTHER): Payer: Medicare (Managed Care) | Attending: Internal Medicine

## 2018-08-18 DIAGNOSIS — I2510 Atherosclerotic heart disease of native coronary artery without angina pectoris: Secondary | ICD-10-CM | POA: Insufficient documentation

## 2018-08-18 DIAGNOSIS — L89620 Pressure ulcer of left heel, unstageable: Secondary | ICD-10-CM | POA: Insufficient documentation

## 2018-08-18 DIAGNOSIS — G4730 Sleep apnea, unspecified: Secondary | ICD-10-CM | POA: Diagnosis not present

## 2018-08-18 DIAGNOSIS — T8131XA Disruption of external operation (surgical) wound, not elsewhere classified, initial encounter: Secondary | ICD-10-CM | POA: Diagnosis present

## 2018-08-18 DIAGNOSIS — Y838 Other surgical procedures as the cause of abnormal reaction of the patient, or of later complication, without mention of misadventure at the time of the procedure: Secondary | ICD-10-CM | POA: Insufficient documentation

## 2018-08-18 DIAGNOSIS — N186 End stage renal disease: Secondary | ICD-10-CM | POA: Insufficient documentation

## 2018-08-18 DIAGNOSIS — E1122 Type 2 diabetes mellitus with diabetic chronic kidney disease: Secondary | ICD-10-CM | POA: Insufficient documentation

## 2018-08-18 DIAGNOSIS — I120 Hypertensive chronic kidney disease with stage 5 chronic kidney disease or end stage renal disease: Secondary | ICD-10-CM | POA: Diagnosis not present

## 2018-08-25 ENCOUNTER — Ambulatory Visit (HOSPITAL_COMMUNITY)
Admission: RE | Admit: 2018-08-25 | Discharge: 2018-08-25 | Disposition: A | Discharge status: Home / Self Care | Payer: Medicare (Managed Care) | Source: Ambulatory Visit | Attending: Family Medicine | Admitting: Family Medicine

## 2018-08-25 DIAGNOSIS — I2510 Atherosclerotic heart disease of native coronary artery without angina pectoris: Secondary | ICD-10-CM | POA: Diagnosis not present

## 2018-08-25 DIAGNOSIS — N200 Calculus of kidney: Secondary | ICD-10-CM | POA: Insufficient documentation

## 2018-08-25 DIAGNOSIS — R918 Other nonspecific abnormal finding of lung field: Secondary | ICD-10-CM | POA: Diagnosis not present

## 2018-08-25 DIAGNOSIS — I700 Atherosclerosis of aorta: Secondary | ICD-10-CM | POA: Diagnosis not present

## 2018-08-25 DIAGNOSIS — R911 Solitary pulmonary nodule: Secondary | ICD-10-CM

## 2018-08-28 ENCOUNTER — Other Ambulatory Visit (HOSPITAL_COMMUNITY): Payer: Self-pay | Admitting: Family Medicine

## 2018-08-28 DIAGNOSIS — R918 Other nonspecific abnormal finding of lung field: Secondary | ICD-10-CM

## 2018-09-01 DIAGNOSIS — T8131XA Disruption of external operation (surgical) wound, not elsewhere classified, initial encounter: Secondary | ICD-10-CM | POA: Diagnosis not present

## 2018-09-15 ENCOUNTER — Ambulatory Visit (HOSPITAL_COMMUNITY)
Admission: RE | Admit: 2018-09-15 | Discharge: 2018-09-15 | Disposition: A | Discharge status: Home / Self Care | Payer: Medicare (Managed Care) | Source: Ambulatory Visit | Attending: Family Medicine | Admitting: Family Medicine

## 2018-09-15 DIAGNOSIS — R918 Other nonspecific abnormal finding of lung field: Secondary | ICD-10-CM | POA: Diagnosis not present

## 2018-09-15 LAB — GLUCOSE, CAPILLARY: Glucose-Capillary: 127 mg/dL — ABNORMAL HIGH (ref 70–99)

## 2018-09-15 MED ORDER — MORPHINE SULFATE (PF) 4 MG/ML IV SOLN: 3.9500 mg | Freq: Once | INTRAVENOUS | Status: DC

## 2018-09-15 MED ORDER — FLUDEOXYGLUCOSE F - 18 (FDG) INJECTION
8.5800 | Freq: Once | INTRAVENOUS | Status: AC | PRN
  Administered 2018-09-15: 8.58 via INTRAVENOUS

## 2018-09-17 ENCOUNTER — Other Ambulatory Visit (HOSPITAL_COMMUNITY): Payer: Self-pay | Admitting: Respiratory Therapy

## 2018-09-17 DIAGNOSIS — C3412 Malignant neoplasm of upper lobe, left bronchus or lung: Secondary | ICD-10-CM

## 2018-09-18 ENCOUNTER — Encounter: Payer: Self-pay | Admitting: *Deleted

## 2018-09-18 NOTE — Progress Notes (Signed)
Oncology Nurse Navigator Documentation  Oncology Nurse Navigator Flowsheets 09/18/2018  Navigator Location [facility]  Referral date to RadOnc/MedOnc 09/16/2018  Navigator Encounter Type Other/Steven Duffy has been referred to thoracic clinic and needed PFT's before being seen.  I check today and it has been scheduled for 09/26/18.  I called [facility] today to arrange transportation.  I left vm message with their transportation.  I then received a call back with the transportation service and they are aware of patient's appt time and place.   Treatment Phase Abnormal Scans  Barriers/Navigation Needs Coordination of Care  Interventions Coordination of Care  Coordination of Care Other  Acuity Level 2  Time Spent with Patient 30

## 2018-09-26 ENCOUNTER — Ambulatory Visit (HOSPITAL_COMMUNITY)
Admission: RE | Admit: 2018-09-26 | Discharge: 2018-09-26 | Disposition: A | Discharge status: Home / Self Care | Payer: Medicare (Managed Care) | Source: Ambulatory Visit | Attending: Family Medicine | Admitting: Family Medicine

## 2018-09-26 DIAGNOSIS — C3412 Malignant neoplasm of upper lobe, left bronchus or lung: Secondary | ICD-10-CM | POA: Diagnosis not present

## 2018-09-26 LAB — PULMONARY FUNCTION TEST
FEF 25-75 Pre: 1.07 L/sec
FEF2575-%Pred-Pre: 57 %
FEV1-%Pred-Pre: 80 %
FEV1-Pre: 2.1 L
FEV1FVC-%Pred-Pre: 93 %
FEV6-%Pred-Pre: 88 %
FEV6-Pre: 3.02 L
FEV6FVC-%Pred-Pre: 104 %
FVC-%Pred-Pre: 84 %
FVC-Pre: 3.1 L
Pre FEV1/FVC ratio: 68 %
Pre FEV6/FVC Ratio: 97 %

## 2018-09-26 NOTE — Progress Notes (Signed)
Patient here for PFT.  Simple PFT performed due to patient's inability to move into machine for testing.  No DLCO  Or pleth performed.  Patient could not hold mouthpiece in mouth; second RT assisted with the test.

## 2018-09-29 ENCOUNTER — Encounter: Payer: Medicare (Managed Care) | Admitting: Thoracic Surgery (Cardiothoracic Vascular Surgery)

## 2018-09-29 ENCOUNTER — Encounter (HOSPITAL_BASED_OUTPATIENT_CLINIC_OR_DEPARTMENT_OTHER): Payer: Medicare (Managed Care) | Attending: Internal Medicine

## 2018-09-29 DIAGNOSIS — E1122 Type 2 diabetes mellitus with diabetic chronic kidney disease: Secondary | ICD-10-CM | POA: Insufficient documentation

## 2018-09-29 DIAGNOSIS — I2510 Atherosclerotic heart disease of native coronary artery without angina pectoris: Secondary | ICD-10-CM | POA: Insufficient documentation

## 2018-09-29 DIAGNOSIS — G4730 Sleep apnea, unspecified: Secondary | ICD-10-CM | POA: Diagnosis not present

## 2018-09-29 DIAGNOSIS — N186 End stage renal disease: Secondary | ICD-10-CM | POA: Insufficient documentation

## 2018-09-29 DIAGNOSIS — E11621 Type 2 diabetes mellitus with foot ulcer: Secondary | ICD-10-CM | POA: Diagnosis present

## 2018-09-29 DIAGNOSIS — T8131XA Disruption of external operation (surgical) wound, not elsewhere classified, initial encounter: Secondary | ICD-10-CM | POA: Insufficient documentation

## 2018-09-29 DIAGNOSIS — I120 Hypertensive chronic kidney disease with stage 5 chronic kidney disease or end stage renal disease: Secondary | ICD-10-CM | POA: Insufficient documentation

## 2018-09-29 DIAGNOSIS — L97422 Non-pressure chronic ulcer of left heel and midfoot with fat layer exposed: Secondary | ICD-10-CM | POA: Diagnosis not present

## 2018-09-29 DIAGNOSIS — Y838 Other surgical procedures as the cause of abnormal reaction of the patient, or of later complication, without mention of misadventure at the time of the procedure: Secondary | ICD-10-CM | POA: Insufficient documentation

## 2018-09-30 ENCOUNTER — Other Ambulatory Visit: Payer: Self-pay | Admitting: Cardiothoracic Surgery

## 2018-09-30 ENCOUNTER — Encounter: Payer: Self-pay | Admitting: Internal Medicine

## 2018-09-30 DIAGNOSIS — I25119 Atherosclerotic heart disease of native coronary artery with unspecified angina pectoris: Secondary | ICD-10-CM

## 2018-10-02 ENCOUNTER — Ambulatory Visit
Admission: RE | Admit: 2018-10-02 | Discharge: 2018-10-02 | Disposition: A | Discharge status: Home / Self Care | Payer: Medicare (Managed Care) | Source: Ambulatory Visit | Attending: Radiation Oncology | Admitting: Radiation Oncology

## 2018-10-02 ENCOUNTER — Encounter: Payer: Self-pay | Admitting: Thoracic Surgery (Cardiothoracic Vascular Surgery)

## 2018-10-02 ENCOUNTER — Institutional Professional Consult (permissible substitution) (INDEPENDENT_AMBULATORY_CARE_PROVIDER_SITE_OTHER): Payer: Medicare (Managed Care) | Admitting: Thoracic Surgery (Cardiothoracic Vascular Surgery)

## 2018-10-02 VITALS — BP 133/75 | HR 70 | Temp 97.7°F | Resp 18

## 2018-10-02 DIAGNOSIS — R911 Solitary pulmonary nodule: Secondary | ICD-10-CM | POA: Diagnosis not present

## 2018-10-02 DIAGNOSIS — Z45018 Encounter for adjustment and management of other part of cardiac pacemaker: Secondary | ICD-10-CM

## 2018-10-02 DIAGNOSIS — D381 Neoplasm of uncertain behavior of trachea, bronchus and lung: Secondary | ICD-10-CM

## 2018-10-02 NOTE — H&P (View-Only) (Signed)
PCP is Janifer Adie, MD Referring Provider is Janifer Adie, MD  No chief complaint on file.   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  In March 2018 he was being evaluated for chest pain and shortness of breath.  He had a CT of the chest which showed a groundglass opacity in the left upper lobe.  He had another CT in September 2019 which showed the left upper lobe nodule was enlarged to 28 mm and now had a 14 mm solid component.  On PET/CT the nodule was hypermetabolic with an SUV of 6.38.  There was no hypermetabolic adenopathy or evidence of distant metastases.  Mr. Vandewater is wheelchair-bound.  He can stand on one leg with assistance.  He has left hemiparesis.  He is blind.  He denies any chest pain or shortness of breath but obviously is very limited in his activities. Zubrod Score: At the time of surgery this patient's most appropriate activity status/level should be described as: []     0    Normal activity, no symptoms []     1    Restricted in physical strenuous activity but ambulatory, able to do out light work []     2    Ambulatory and capable of self care, unable to do work activities, up and about >50 % of waking hours                              []     3    Only limited self care, in bed greater than 50% of waking hours [x]     4    Completely disabled, no self care, confined to bed or chair []     5    Moribund  Past Medical History:  Diagnosis Date  . Alcohol dependence in remission ([place])   . Allergic  rhinitis   . Anemia   . Anxiety    chronic  . Arthritis    osteoarthritis   . Atrial fibrillation ([place])   . Blind   . BPH (benign prostatic hypertrophy) with urinary obstruction   . Brachial neuritis or radiculitis   . Carotid bruit   . Colon polyps    hx of   . Coronary artery disease    non obstructive per cath 2005   . Depression   . Dermatitis   . Diabetes mellitus without complication ([place])    type II  . GERD (gastroesophageal reflux disease)   . Glaucoma   . Headache    hx of migraines   . Hematuria    hx of   . Hemorrhoids   . History of kidney stones   . Hyperlipidemia   . Hypertension   . Kidney stones    stage 3  . Lumbago   . Lumbar radiculopathy   . Neuropathy   . Neuropathy due to medical condition ([place])   . Nontraumatic intracranial hemorrhage ([place])   . Occasional tremors    right arm   . Pharyngitis    hx of   . Pneumonia    hx of legionnaires in  2003  . Presence of permanent cardiac pacemaker    [employer]   . Prostatitis   . Rotator cuff tear   . Sinus bradycardia    required pacer   . Sleep apnea    bipap  . Stroke [facility])    left side weakness   . Ventricular tachycardia ([facility])    hx of   . Vitreous hemorrhage ([facility])     Past Surgical History:  Procedure Laterality Date  . AMPUTATION Left 07/20/2018   Procedure: Debridement of left achilles tendon and calcaneous and left heel ulceration;  Surgeon: Dorna Leitz, MD;  Location: [facility];  Service: Orthopedics;  Laterality: Left;  . cataract surgery      left   . CIRCUMCISION    . COLONOSCOPY    . CYSTOSCOPY     multiple   . CYSTOSCOPY WITH RETROGRADE PYELOGRAM, URETEROSCOPY AND STENT PLACEMENT Right 07/04/2015   Procedure: RIGHT RETROGRADE PYELOGRAM,  RIGHT URETEROSCOPY ;  Surgeon: Kathie Rhodes, MD;  Location: [facility];  Service: Urology;  Laterality: Right;  . CYSTOSCOPY/RETROGRADE/URETEROSCOPY/STONE EXTRACTION WITH BASKET Right 01/04/2017   Procedure:  CYSTOSCOPY/RETROGRADE/URETEROSCOPY/HOLMIUM LASER/STONE EXTRACTION WITH BASKET/ DOUBLE J STENT;  Surgeon: Kathie Rhodes, MD;  Location: [facility];  Service: Urology;  Laterality: Right;  . hemilaminectomy    . HOLMIUM LASER APPLICATION Right 7/61/9509   Procedure: HOLMIUM LASER LITHOTRIPSY AND STENT PLACEMENT ;  Surgeon: Kathie Rhodes, MD;  Location: [facility];  Service: Urology;  Laterality: Right;  . INTRAOCULAR LENS INSERTION    . IR URETERAL STENT RIGHT NEW ACCESS W/O SEP NEPHROSTOMY CATH  10/07/2017  . IR URETERAL STENT RIGHT NEW ACCESS W/O SEP NEPHROSTOMY CATH  10/07/2017  . neck surgery in 1995 (neck broken)    . NEPHROLITHOTOMY Right 10/07/2017   Procedure: NEPHROLITHOTOMY PERCUTANEOUS;  Surgeon: Kathie Rhodes, MD;  Location: [facility];  Service: Urology;  Laterality: Right;  . placement of gastrostomy tube      hx of   . PP vitrectomy     . rhinologic surgery     . SHOULDER SURGERY    . TRACHEOSTOMY     hx of   . URETHROPLASTY    . VASECTOMY      No family history on file.  Social History Social History   Tobacco Use  . Smoking status: Former Smoker    Last attempt to quit: 12/29/1980    Years since quitting: 37.7  . Smokeless tobacco: Never Used  Substance Use Topics  . Alcohol use: No  . Drug use: No    Current Outpatient Medications  Medication Sig Dispense Refill  . acetaminophen (TYLENOL) 500 MG tablet Take 500-1,000 mg by mouth every 4 (four) hours as needed for mild pain.     Marland Kitchen acetaZOLAMIDE (DIAMOX) 250 MG tablet Take 250 mg by mouth 3 (three) times daily.    Marland Kitchen atorvastatin (LIPITOR) 10 MG tablet Take 10 mg by mouth daily.    . bimatoprost (LUMIGAN) 0.01 % SOLN Place 1 drop into the right eye at bedtime as needed (glaucoma).     Marland Kitchen buPROPion (WELLBUTRIN XL) 150 MG 24 hr tablet Take 150 mg by mouth every morning.     . calcium citrate (CALCITRATE - DOSED IN MG ELEMENTAL CALCIUM) 950 MG tablet Take 400 mg of elemental calcium by mouth 2 (two) times daily.    . collagenase  (SANTYL) ointment Apply 1 application topically daily.    . dabigatran (PRADAXA) 150 MG CAPS capsule Take 150 mg by mouth 2 (two)  times daily.    . digoxin (LANOXIN) 0.125 MG tablet Take 0.0625 mg by mouth every morning.     . diltiazem (DILACOR XR) 180 MG 24 hr capsule Take 180 mg by mouth every morning.     . docusate sodium (COLACE) 250 MG capsule Take 250 mg by mouth daily.    . dorzolamidel-timolol (COSOPT) 22.3-6.8 MG/ML SOLN ophthalmic solution Place 1 drop into the right eye 2 (two) times daily. For glaucoma    . escitalopram (LEXAPRO) 20 MG tablet Take 20 mg by mouth every morning.    . finasteride (PROSCAR) 5 MG tablet Take 5 mg by mouth every morning.     . furosemide (LASIX) 40 MG tablet Take 40 mg by mouth daily.    Marland Kitchen gabapentin (NEURONTIN) 300 MG capsule Take 300 mg by mouth 2 (two) times daily.     Marland Kitchen levothyroxine (SYNTHROID, LEVOTHROID) 75 MCG tablet Take 37.5 mcg by mouth daily before breakfast.    . magnesium oxide (MAG-OX) 400 MG tablet Take 400 mg by mouth every morning.     . Nutritional Supplements (PROMOD) LIQD Take 30 mLs by mouth 3 (three) times daily with meals.    . Polyethylene Glycol 3350 (MIRALAX PO) Take 17 g by mouth daily as needed (constipation).    . ranitidine (ZANTAC) 150 MG tablet Take 150 mg by mouth at bedtime.    . Rivaroxaban (XARELTO) 15 MG TABS tablet Take 15 mg by mouth 2 (two) times daily with a meal.    . Skin Protectants, Misc. (PERIGUARD) OINT Apply 1 application topically daily as needed. TO AFFECTED SITE (FOR RASH)    . Sunscreens (AVEENO DAILY MOISTURIZER) LOTN Apply 1 application topically 2 (two) times daily. To lower extremities.    . tamsulosin (FLOMAX) 0.4 MG CAPS capsule Take 0.4 mg by mouth every evening. FOR URINARY SYMPTOMS     No current facility-administered medications for this visit.     Allergies  Allergen Reactions  . Duloxetine Nausea And Vomiting and Other (See Comments)    depression  . Iodine Swelling  . Lyrica  [Pregabalin] Other (See Comments)    Depression, suicidal   . Shellfish-Derived Products Swelling  . Procaine Other (See Comments)    unspecified  . Ciprofloxacin Rash  . Penicillins Rash    CHILDHOOD ALLERGY Has patient had a PCN reaction causing immediate rash, facial/tongue/throat swelling, SOB or lightheadedness with hypotension: YES Has patient had a PCN reaction causing severe rash involving mucus membranes or skin necrosis: No Has patient had a PCN reaction that required hospitalization NO Has patient had a PCN reaction occurring within the last 10 years: NO  If all of the above answers are "NO", then may proceed with Cephalosporin use.   . Tape Rash    NOT paper tape    Review of Systems  Constitutional: Negative for activity change (Limited for many years) and unexpected weight change.  HENT: Negative for trouble swallowing and voice change.   Eyes: Positive for visual disturbance.  Respiratory: Negative for shortness of breath.   Cardiovascular: Negative for chest pain.  Gastrointestinal: Negative for abdominal pain.  Musculoskeletal: Positive for arthralgias.  Skin: Positive for wound (Pressure ulcer left heel).  Neurological: Positive for speech difficulty and weakness.  All other systems reviewed and are negative.   BP 133/75   Pulse 70   Temp 97.7 F (36.5 C)   Resp 18   SpO2 100%  Physical Exam  Constitutional: He is oriented to person, place, and  time. No distress.  Elderly debilitated gentleman in wheelchair  HENT:  Head: Normocephalic.  Mouth/Throat: No oropharyngeal exudate.  Eyes: No scleral icterus.  Eyes deviated  Neck: No thyromegaly present.  Cardiovascular: Normal rate and regular rhythm.  Pulmonary/Chest: Effort normal and breath sounds normal.  Abdominal: Soft. He exhibits no distension.  Lymphadenopathy:    He has no cervical adenopathy.  Neurological: He is alert and oriented to person, place, and time. He exhibits abnormal muscle tone  (Left hemiparesis).  Skin: Skin is warm and dry.  Vitals reviewed.    Diagnostic Tests: CT CHEST WITHOUT CONTRAST  TECHNIQUE: Multidetector CT imaging of the chest was performed following the standard protocol without IV contrast.  COMPARISON:  Chest x-ray of 07/25/2018, and CT chest of 02/21/2017  FINDINGS: Cardiovascular: Significant thoracic aortic atherosclerosis is noted. There are diffuse coronary artery calcifications and mild cardiomegaly is stable. No pericardial effusion is seen scratch only a tiny pericardial effusion is noted. Pacer wires again are noted. The mid ascending thoracic aorta measures 36 mm in diameter.  Mediastinum/Nodes: No mediastinal or hilar adenopathy is seen on this unenhanced study. The thyroid gland is unremarkable. No abnormality of the thoracic aorta is evident.  Lungs/Pleura: The ground-glass opacity described previously within the left upper lobe has now enlarged with a much more prominent soft tissue component measuring 14 mm on image 40 series 4 with the ground-glass component measuring 28 mm. There has been significant interval growth in this is worrisome for low-grade adenocarcinoma. PET-CT is recommended to evaluate metabolic activity. No additional lung nodule is seen and no mass is evident. There is no evidence of pleural effusion. The central airway is patent. Mild emphysematous changes are present in the upper lobes.  Upper Abdomen: The liver is unremarkable in the unenhanced state. Small renal calculi are noted bilaterally which are nonobstructing.  Musculoskeletal: There are degenerative changes diffusely throughout the thoracic spine.  IMPRESSION: 1. Increase in soft tissue component and overall size of the previously described ground-glass opacity in the left upper lobe posteriorly worrisome for low-grade adenocarcinoma. Recommend PET-CT to assess for metabolic activity. 2. No mediastinal or hilar adenopathy is  seen. 3. Significant thoracic aortic atherosclerosis and diffuse coronary artery calcifications. 4. Small renal calculi bilaterally which are nonobstructing.   Electronically Signed   By: Ivar Drape M.D.   On: 08/26/2018 08:13 NUCLEAR MEDICINE PET SKULL BASE TO THIGH  TECHNIQUE: 8.58 mCi F-18 FDG was injected intravenously. Full-ring PET imaging was performed from the skull base to thigh after the radiotracer. CT data was obtained and used for attenuation correction and anatomic localization.  Fasting blood glucose: 127 mg/dl  COMPARISON:  CT scan 08/25/2018  FINDINGS: Mediastinal blood pool activity: SUV max 1.88  NECK: No hypermetabolic lymph nodes in the neck.  Incidental CT findings: none  CHEST: The 16 mm sub solid lesion in the left upper lobe is hypermetabolic with SUV max of 4.94 and most consistent with primary lung neoplasm.  No enlarged or hypermetabolic mediastinal or hilar lymph nodes to suggest metastatic disease.  Underlying emphysematous changes but no other worrisome pulmonary nodules or areas of hypermetabolism.  Incidental CT findings: Advanced atherosclerotic calcifications involving the aorta and coronary arteries. Pacer wires are stable.  ABDOMEN/PELVIS: No abnormal hypermetabolic activity within the liver, pancreas, adrenal glands, or spleen. No hypermetabolic lymph nodes in the abdomen or pelvis.  Incidental CT findings: Numerous bilateral renal calculi.  Advanced atherosclerotic calcifications involving the aorta and branch vessels.  SKELETON: No focal hypermetabolic activity  to suggest skeletal metastasis.  Incidental CT findings: None  IMPRESSION: 1. 16 mm left upper lobe lung lesion is hypermetabolic and most consistent with primary lung neoplasm. 2. No mediastinal or hilar lymphadenopathy or distant metastatic disease.   Electronically Signed   By: Marijo Sanes M.D.   On: 09/15/2018 14:14  I  personally reviewed the CT and PET/CT images and concur with the findings noted above  Impression: Mr. Rode is a 77 year old gentleman with numerous medical problems who has a 28 mm mixed density nodule in the left upper lobe that has a 14 mm solid component.  It was present on a CT about a year and a half ago and was pure groundglass at that time.  It is hypermetabolic on PET CT.  It is highly suspicious for a new primary bronchogenic carcinoma.  Mr. Arrazola is not a surgical candidate for resection due to his extensive comorbidities and poor functional status.  He has a previous stroke with left hemiparesis.  He is wheelchair-bound, blind, has coronary artery disease and a pacemaker.  His performance status is ECOG 4.  His best option for treatment is stereotactic radiation.  He saw Dr.Moody.  Dr. Lisbeth Renshaw does wish to have a biopsy performed prior to initiation of treatment.  I recommended to Mr. Hedeen and his wife that we do a navigational bronchoscopy for biopsy and fiducial placement.  I described the general nature of the procedure to them.  They do understand this would be done in the operating room under general anesthesia.  We typically do this on an outpatient basis.  I informed him of the indications, risk, benefits, and alternatives.  They understand the risks include death, MI, stroke, hypoxia, bleeding, pneumothorax, and failure to make a diagnosis.  There is also the possibility of other peri- procedural complications particularly given his multiple comorbidities.  We will see if we can schedule him for navigational bronchoscopy next week.  Hopefully we can have his CT from September converted to super D protocol and avoid the need to have another CT done.  Plan: Navigational bronchoscopy for biopsy and fiducial placement.  My office will call to schedule.  Melrose Nakayama, MD [facility] [phone]

## 2018-10-02 NOTE — Progress Notes (Signed)
PCP is Janifer Adie, MD Referring Provider is Janifer Adie, MD  No chief complaint on file.   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  In March 2018 he was being evaluated for chest pain and shortness of breath.  He had a CT of the chest which showed a groundglass opacity in the left upper lobe.  He had another CT in September 2019 which showed the left upper lobe nodule was enlarged to 28 mm and now had a 14 mm solid component.  On PET/CT the nodule was hypermetabolic with an SUV of 0.16.  There was no hypermetabolic adenopathy or evidence of distant metastases.  Steven Duffy is wheelchair-bound.  He can stand on one leg with assistance.  He has left hemiparesis.  He is blind.  He denies any chest pain or shortness of breath but obviously is very limited in his activities. Zubrod Score: At the time of surgery this patient's most appropriate activity status/level should be described as: []     0    Normal activity, no symptoms []     1    Restricted in physical strenuous activity but ambulatory, able to do out light work []     2    Ambulatory and capable of self care, unable to do work activities, up and about >50 % of waking hours                              []     3    Only limited self care, in bed greater than 50% of waking hours [x]     4    Completely disabled, no self care, confined to bed or chair []     5    Moribund  Past Medical History:  Diagnosis Date  . Alcohol dependence in remission ([place])   . Allergic  rhinitis   . Anemia   . Anxiety    chronic  . Arthritis    osteoarthritis   . Atrial fibrillation ([place])   . Blind   . BPH (benign prostatic hypertrophy) with urinary obstruction   . Brachial neuritis or radiculitis   . Carotid bruit   . Colon polyps    hx of   . Coronary artery disease    non obstructive per cath 2005   . Depression   . Dermatitis   . 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 GERD (gastroesophageal reflux disease)   . Glaucoma   . Headache    hx of migraines   . Hematuria    hx of   . Hemorrhoids   . History of kidney stones   . Hyperlipidemia   . Hypertension   . Kidney stones    stage 3  . Lumbago   . Lumbar radiculopathy   . Neuropathy   . Neuropathy due to medical condition ([place])   . Nontraumatic intracranial hemorrhage ([place])   . Occasional tremors    right arm   . Pharyngitis    hx of   . Pneumonia    hx of legionnaires in  2003  . Presence of permanent cardiac pacemaker    [employer]   . Prostatitis   . Rotator cuff tear   . Sinus bradycardia    required pacer   . Sleep apnea    bipap  . Stroke [facility])    left side weakness   . Ventricular tachycardia ([facility])    hx of   . Vitreous hemorrhage ([facility])     Past Surgical History:  Procedure Laterality Date  . AMPUTATION Left 07/20/2018   Procedure: Debridement of left achilles tendon and calcaneous and left heel ulceration;  Surgeon: Dorna Leitz, MD;  Location: [facility];  Service: Orthopedics;  Laterality: Left;  . cataract surgery      left   . CIRCUMCISION    . COLONOSCOPY    . CYSTOSCOPY     multiple   . CYSTOSCOPY WITH RETROGRADE PYELOGRAM, URETEROSCOPY AND STENT PLACEMENT Right 07/04/2015   Procedure: RIGHT RETROGRADE PYELOGRAM,  RIGHT URETEROSCOPY ;  Surgeon: Kathie Rhodes, MD;  Location: [facility];  Service: Urology;  Laterality: Right;  . CYSTOSCOPY/RETROGRADE/URETEROSCOPY/STONE EXTRACTION WITH BASKET Right 01/04/2017   Procedure:  CYSTOSCOPY/RETROGRADE/URETEROSCOPY/HOLMIUM LASER/STONE EXTRACTION WITH BASKET/ DOUBLE J STENT;  Surgeon: Kathie Rhodes, MD;  Location: [facility];  Service: Urology;  Laterality: Right;  . hemilaminectomy    . HOLMIUM LASER APPLICATION Right 5/88/5027   Procedure: HOLMIUM LASER LITHOTRIPSY AND STENT PLACEMENT ;  Surgeon: Kathie Rhodes, MD;  Location: [facility];  Service: Urology;  Laterality: Right;  . INTRAOCULAR LENS INSERTION    . IR URETERAL STENT RIGHT NEW ACCESS W/O SEP NEPHROSTOMY CATH  10/07/2017  . IR URETERAL STENT RIGHT NEW ACCESS W/O SEP NEPHROSTOMY CATH  10/07/2017  . neck surgery in 1995 (neck broken)    . NEPHROLITHOTOMY Right 10/07/2017   Procedure: NEPHROLITHOTOMY PERCUTANEOUS;  Surgeon: Kathie Rhodes, MD;  Location: [facility];  Service: Urology;  Laterality: Right;  . placement of gastrostomy tube      hx of   . PP vitrectomy     . rhinologic surgery     . SHOULDER SURGERY    . TRACHEOSTOMY     hx of   . URETHROPLASTY    . VASECTOMY      No family history on file.  Social History Social History   Tobacco Use  . Smoking status: Former Smoker    Last attempt to quit: 12/29/1980    Years since quitting: 37.7  . Smokeless tobacco: Never Used  Substance Use Topics  . Alcohol use: No  . Drug use: No    Current Outpatient Medications  Medication Sig Dispense Refill  . acetaminophen (TYLENOL) 500 MG tablet Take 500-1,000 mg by mouth every 4 (four) hours as needed for mild pain.     Marland Kitchen acetaZOLAMIDE (DIAMOX) 250 MG tablet Take 250 mg by mouth 3 (three) times daily.    Marland Kitchen atorvastatin (LIPITOR) 10 MG tablet Take 10 mg by mouth daily.    . bimatoprost (LUMIGAN) 0.01 % SOLN Place 1 drop into the right eye at bedtime as needed (glaucoma).     Marland Kitchen buPROPion (WELLBUTRIN XL) 150 MG 24 hr tablet Take 150 mg by mouth every morning.     . calcium citrate (CALCITRATE - DOSED IN MG ELEMENTAL CALCIUM) 950 MG tablet Take 400 mg of elemental calcium by mouth 2 (two) times daily.    . collagenase  (SANTYL) ointment Apply 1 application topically daily.    . dabigatran (PRADAXA) 150 MG CAPS capsule Take 150 mg by mouth 2 (two)  times daily.    . digoxin (LANOXIN) 0.125 MG tablet Take 0.0625 mg by mouth every morning.     . diltiazem (DILACOR XR) 180 MG 24 hr capsule Take 180 mg by mouth every morning.     . docusate sodium (COLACE) 250 MG capsule Take 250 mg by mouth daily.    . dorzolamidel-timolol (COSOPT) 22.3-6.8 MG/ML SOLN ophthalmic solution Place 1 drop into the right eye 2 (two) times daily. For glaucoma    . escitalopram (LEXAPRO) 20 MG tablet Take 20 mg by mouth every morning.    . finasteride (PROSCAR) 5 MG tablet Take 5 mg by mouth every morning.     . furosemide (LASIX) 40 MG tablet Take 40 mg by mouth daily.    Marland Kitchen gabapentin (NEURONTIN) 300 MG capsule Take 300 mg by mouth 2 (two) times daily.     Marland Kitchen levothyroxine (SYNTHROID, LEVOTHROID) 75 MCG tablet Take 37.5 mcg by mouth daily before breakfast.    . magnesium oxide (MAG-OX) 400 MG tablet Take 400 mg by mouth every morning.     . Nutritional Supplements (PROMOD) LIQD Take 30 mLs by mouth 3 (three) times daily with meals.    . Polyethylene Glycol 3350 (MIRALAX PO) Take 17 g by mouth daily as needed (constipation).    . ranitidine (ZANTAC) 150 MG tablet Take 150 mg by mouth at bedtime.    . Rivaroxaban (XARELTO) 15 MG TABS tablet Take 15 mg by mouth 2 (two) times daily with a meal.    . Skin Protectants, Misc. (PERIGUARD) OINT Apply 1 application topically daily as needed. TO AFFECTED SITE (FOR RASH)    . Sunscreens (AVEENO DAILY MOISTURIZER) LOTN Apply 1 application topically 2 (two) times daily. To lower extremities.    . tamsulosin (FLOMAX) 0.4 MG CAPS capsule Take 0.4 mg by mouth every evening. FOR URINARY SYMPTOMS     No current facility-administered medications for this visit.     Allergies  Allergen Reactions  . Duloxetine Nausea And Vomiting and Other (See Comments)    depression  . Iodine Swelling  . Lyrica  [Pregabalin] Other (See Comments)    Depression, suicidal   . Shellfish-Derived Products Swelling  . Procaine Other (See Comments)    unspecified  . Ciprofloxacin Rash  . Penicillins Rash    CHILDHOOD ALLERGY Has patient had a PCN reaction causing immediate rash, facial/tongue/throat swelling, SOB or lightheadedness with hypotension: YES Has patient had a PCN reaction causing severe rash involving mucus membranes or skin necrosis: No Has patient had a PCN reaction that required hospitalization NO Has patient had a PCN reaction occurring within the last 10 years: NO  If all of the above answers are "NO", then may proceed with Cephalosporin use.   . Tape Rash    NOT paper tape    Review of Systems  Constitutional: Negative for activity change (Limited for many years) and unexpected weight change.  HENT: Negative for trouble swallowing and voice change.   Eyes: Positive for visual disturbance.  Respiratory: Negative for shortness of breath.   Cardiovascular: Negative for chest pain.  Gastrointestinal: Negative for abdominal pain.  Musculoskeletal: Positive for arthralgias.  Skin: Positive for wound (Pressure ulcer left heel).  Neurological: Positive for speech difficulty and weakness.  All other systems reviewed and are negative.   BP 133/75   Pulse 70   Temp 97.7 F (36.5 C)   Resp 18   SpO2 100%  Physical Exam  Constitutional: He is oriented to person, place, and  time. No distress.  Elderly debilitated gentleman in wheelchair  HENT:  Head: Normocephalic.  Mouth/Throat: No oropharyngeal exudate.  Eyes: No scleral icterus.  Eyes deviated  Neck: No thyromegaly present.  Cardiovascular: Normal rate and regular rhythm.  Pulmonary/Chest: Effort normal and breath sounds normal.  Abdominal: Soft. He exhibits no distension.  Lymphadenopathy:    He has no cervical adenopathy.  Neurological: He is alert and oriented to person, place, and time. He exhibits abnormal muscle tone  (Left hemiparesis).  Skin: Skin is warm and dry.  Vitals reviewed.    Diagnostic Tests: CT CHEST WITHOUT CONTRAST  TECHNIQUE: Multidetector CT imaging of the chest was performed following the standard protocol without IV contrast.  COMPARISON:  Chest x-ray of 07/25/2018, and CT chest of 02/21/2017  FINDINGS: Cardiovascular: Significant thoracic aortic atherosclerosis is noted. There are diffuse coronary artery calcifications and mild cardiomegaly is stable. No pericardial effusion is seen scratch only a tiny pericardial effusion is noted. Pacer wires again are noted. The mid ascending thoracic aorta measures 36 mm in diameter.  Mediastinum/Nodes: No mediastinal or hilar adenopathy is seen on this unenhanced study. The thyroid gland is unremarkable. No abnormality of the thoracic aorta is evident.  Lungs/Pleura: The ground-glass opacity described previously within the left upper lobe has now enlarged with a much more prominent soft tissue component measuring 14 mm on image 40 series 4 with the ground-glass component measuring 28 mm. There has been significant interval growth in this is worrisome for low-grade adenocarcinoma. PET-CT is recommended to evaluate metabolic activity. No additional lung nodule is seen and no mass is evident. There is no evidence of pleural effusion. The central airway is patent. Mild emphysematous changes are present in the upper lobes.  Upper Abdomen: The liver is unremarkable in the unenhanced state. Small renal calculi are noted bilaterally which are nonobstructing.  Musculoskeletal: There are degenerative changes diffusely throughout the thoracic spine.  IMPRESSION: 1. Increase in soft tissue component and overall size of the previously described ground-glass opacity in the left upper lobe posteriorly worrisome for low-grade adenocarcinoma. Recommend PET-CT to assess for metabolic activity. 2. No mediastinal or hilar adenopathy is  seen. 3. Significant thoracic aortic atherosclerosis and diffuse coronary artery calcifications. 4. Small renal calculi bilaterally which are nonobstructing.   Electronically Signed   By: Ivar Drape M.D.   On: 08/26/2018 08:13 NUCLEAR MEDICINE PET SKULL BASE TO THIGH  TECHNIQUE: 8.58 mCi F-18 FDG was injected intravenously. Full-ring PET imaging was performed from the skull base to thigh after the radiotracer. CT data was obtained and used for attenuation correction and anatomic localization.  Fasting blood glucose: 127 mg/dl  COMPARISON:  CT scan 08/25/2018  FINDINGS: Mediastinal blood pool activity: SUV max 1.88  NECK: No hypermetabolic lymph nodes in the neck.  Incidental CT findings: none  CHEST: The 16 mm sub solid lesion in the left upper lobe is hypermetabolic with SUV max of 6.19 and most consistent with primary lung neoplasm.  No enlarged or hypermetabolic mediastinal or hilar lymph nodes to suggest metastatic disease.  Underlying emphysematous changes but no other worrisome pulmonary nodules or areas of hypermetabolism.  Incidental CT findings: Advanced atherosclerotic calcifications involving the aorta and coronary arteries. Pacer wires are stable.  ABDOMEN/PELVIS: No abnormal hypermetabolic activity within the liver, pancreas, adrenal glands, or spleen. No hypermetabolic lymph nodes in the abdomen or pelvis.  Incidental CT findings: Numerous bilateral renal calculi.  Advanced atherosclerotic calcifications involving the aorta and branch vessels.  SKELETON: No focal hypermetabolic activity  to suggest skeletal metastasis.  Incidental CT findings: None  IMPRESSION: 1. 16 mm left upper lobe lung lesion is hypermetabolic and most consistent with primary lung neoplasm. 2. No mediastinal or hilar lymphadenopathy or distant metastatic disease.   Electronically Signed   By: Marijo Sanes M.D.   On: 09/15/2018 14:14  I  personally reviewed the CT and PET/CT images and concur with the findings noted above  Impression: Steven Duffy is a 77 year old gentleman with numerous medical problems who has a 28 mm mixed density nodule in the left upper lobe that has a 14 mm solid component.  It was present on a CT about a year and a half ago and was pure groundglass at that time.  It is hypermetabolic on PET CT.  It is highly suspicious for a new primary bronchogenic carcinoma.  Steven Duffy is not a surgical candidate for resection due to his extensive comorbidities and poor functional status.  He has a previous stroke with left hemiparesis.  He is wheelchair-bound, blind, has coronary artery disease and a pacemaker.  His performance status is ECOG 4.  His best option for treatment is stereotactic radiation.  He saw Dr.Moody.  Dr. Lisbeth Renshaw does wish to have a biopsy performed prior to initiation of treatment.  I recommended to Steven Duffy and his wife that we do a navigational bronchoscopy for biopsy and fiducial placement.  I described the general nature of the procedure to them.  They do understand this would be done in the operating room under general anesthesia.  We typically do this on an outpatient basis.  I informed him of the indications, risk, benefits, and alternatives.  They understand the risks include death, MI, stroke, hypoxia, bleeding, pneumothorax, and failure to make a diagnosis.  There is also the possibility of other peri- procedural complications particularly given his multiple comorbidities.  We will see if we can schedule him for navigational bronchoscopy next week.  Hopefully we can have his CT from September converted to super D protocol and avoid the need to have another CT done.  Plan: Navigational bronchoscopy for biopsy and fiducial placement.  My office will call to schedule.  Melrose Nakayama, MD [facility] [phone]

## 2018-10-03 ENCOUNTER — Encounter: Payer: Self-pay | Admitting: *Deleted

## 2018-10-03 ENCOUNTER — Encounter: Payer: Medicare (Managed Care) | Admitting: Thoracic Surgery (Cardiothoracic Vascular Surgery)

## 2018-10-03 ENCOUNTER — Other Ambulatory Visit: Payer: Self-pay | Admitting: *Deleted

## 2018-10-03 DIAGNOSIS — R911 Solitary pulmonary nodule: Secondary | ICD-10-CM

## 2018-10-03 NOTE — Progress Notes (Signed)
Oncology Nurse Navigator Documentation  Oncology Nurse Navigator Flowsheets 10/03/2018  Navigator Location [facility]  Navigator Encounter Type Clinic/MDC/I spoke with patient, wife and 2 caregivers at the cancer center yesterday.  Steven Duffy treatment plan is tissue DX and then radiation therapy.  His biopsy is scheduled for 10/09/18.  I will update Rad Onc PA of this appt.   Abnormal Finding Date 08/25/2018  Multidisiplinary Clinic Date 10/02/2018  Patient Visit Type MedOnc  Treatment Phase Abnormal Scans  Barriers/Navigation Needs Education;Coordination of Care  Education Other  Interventions Coordination of Care;Education  Coordination of Care Other  Education Method Verbal  Acuity Level 2  Time Spent with Patient 30

## 2018-10-04 DIAGNOSIS — C3412 Malignant neoplasm of upper lobe, left bronchus or lung: Secondary | ICD-10-CM | POA: Insufficient documentation

## 2018-10-04 NOTE — Progress Notes (Signed)
Radiation Oncology         [phone] ________________________________  Name: Steven Duffy        MRN: [id number]  Date of Service: 10/02/2018 DOB: [birth year]  BZ:JIRCVEL, [name], MD  Janifer Adie, MD     REFERRING PHYSICIAN: Janifer Adie, MD   DIAGNOSIS: The primary encounter diagnosis was Pacemaker reprogramming/check. 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   HISTORY OF PRESENT ILLNESS: Steven Duffy is a 77 y.o. male seen at the request of Dr. Bradd Burner for a newly noted LUL lesion.  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  He has sick sinus syndrome requiring a pacemaker, as well as other medical comorbidities including diabetes, coronary disease, atrial fibrillation, and was in the process of being worked up for chest pain or shortness of breath concerning for possible CHF and onset chest x-ray was found to have what appeared to be a mass in the left upper lobe.  On 08/25/2018 he underwent CT scan of the chest which revealed a 14 x 27 mm lesion in the left upper lobe.  A PET scan on 09/15/2018 revealed activity with an SUV of 5, suspicious for disease.  No evidence of adenopathy elsewhere in the chest was appreciated or elsewhere in the remainder of his body.  While he did undergo PFTs and these were acceptable to consider surgery, the patient is seen today to discuss options of surgical resection with Dr. Roxan Hockey versus proceeding with a biopsy in lieu of consideration of stereotactic body radiotherapy   PREVIOUS RADIATION THERAPY: No   PAST MEDICAL HISTORY:  Past Medical History:  Diagnosis Date  . Alcohol dependence in remission ([place])   . Allergic rhinitis   . Anemia   . Anxiety    chronic  . Arthritis    osteoarthritis   . Atrial fibrillation ([place])   . Blind   . BPH (benign prostatic hypertrophy) with urinary obstruction   .  Brachial neuritis or radiculitis   . Carotid bruit   . Colon polyps    hx of   . Coronary artery disease    non obstructive per cath 2005   . Depression   . Dermatitis   . Diabetes mellitus without complication ([place])    type II  . GERD (gastroesophageal reflux disease)   . Glaucoma   . Headache    hx of migraines   . Hematuria    hx of   . Hemorrhoids   . History of kidney stones   . Hyperlipidemia   . Hypertension   . Kidney stones    stage 3  . Lumbago   . Lumbar radiculopathy   . Neuropathy   . Neuropathy due to medical condition ([place])   . Nontraumatic intracranial hemorrhage ([place])   . Occasional tremors    right arm   . Pharyngitis    hx of   . Pneumonia    hx of legionnaires in 2003  . Presence of permanent cardiac pacemaker    [employer]   . Prostatitis   . Rotator cuff tear   . Sinus bradycardia    required pacer   . Sleep apnea    bipap  . Stroke [facility])    left side weakness   . Ventricular tachycardia (H[facility])    hx of   . Vitreous hemorrhage ([place])  PAST SURGICAL HISTORY: Past Surgical History:  Procedure Laterality Date  . AMPUTATION Left 07/20/2018   Procedure: Debridement of left achilles tendon and calcaneous and left heel ulceration;  Surgeon: Dorna Leitz, MD;  Location: [facility];  Service: Orthopedics;  Laterality: Left;  . cataract surgery      left   . CIRCUMCISION    . COLONOSCOPY    . CYSTOSCOPY     multiple   . CYSTOSCOPY WITH RETROGRADE PYELOGRAM, URETEROSCOPY AND STENT PLACEMENT Right 07/04/2015   Procedure: RIGHT RETROGRADE PYELOGRAM,  RIGHT URETEROSCOPY ;  Surgeon: Kathie Rhodes, MD;  Location: [facility];  Service: Urology;  Laterality: Right;  . CYSTOSCOPY/RETROGRADE/URETEROSCOPY/STONE EXTRACTION WITH BASKET Right 01/04/2017   Procedure: CYSTOSCOPY/RETROGRADE/URETEROSCOPY/HOLMIUM LASER/STONE EXTRACTION WITH BASKET/ DOUBLE J STENT;  Surgeon: Kathie Rhodes, MD;  Location: [facility];  Service: Urology;  Laterality: Right;  . hemilaminectomy     . HOLMIUM LASER APPLICATION Right 3/53/6144   Procedure: HOLMIUM LASER LITHOTRIPSY AND STENT PLACEMENT ;  Surgeon: Kathie Rhodes, MD;  Location: [facility];  Service: Urology;  Laterality: Right;  . INTRAOCULAR LENS INSERTION    . IR URETERAL STENT RIGHT NEW ACCESS W/O SEP NEPHROSTOMY CATH  10/07/2017  . IR URETERAL STENT RIGHT NEW ACCESS W/O SEP NEPHROSTOMY CATH  10/07/2017  . neck surgery in 1995 (neck broken)    . NEPHROLITHOTOMY Right 10/07/2017   Procedure: NEPHROLITHOTOMY PERCUTANEOUS;  Surgeon: Kathie Rhodes, MD;  Location: [facility];  Service: Urology;  Laterality: Right;  . placement of gastrostomy tube      hx of   . PP vitrectomy     . rhinologic surgery     . SHOULDER SURGERY    . TRACHEOSTOMY     hx of   . URETHROPLASTY    . VASECTOMY       FAMILY HISTORY: No family history on file.   SOCIAL HISTORY:  reports that he quit smoking about 37 years ago. He has never used smokeless tobacco. He reports that he does not drink alcohol or use drugs.  The patient is married and accompanied by his wife.  He has the support as well of his minister today, and his home health caregiver.  ALLERGIES: Duloxetine; Iodine; Lyrica [pregabalin]; Shellfish-derived products; Procaine; Ciprofloxacin; Penicillins; and Tape   MEDICATIONS:  Current Outpatient Medications  Medication Sig Dispense Refill  . acetaminophen (TYLENOL) 500 MG tablet Take 500-1,000 mg by mouth every 4 (four) hours as needed for mild pain.     Marland Kitchen acetaZOLAMIDE (DIAMOX) 250 MG tablet Take 250 mg by mouth 3 (three) times daily.    Marland Kitchen atorvastatin (LIPITOR) 10 MG tablet Take 10 mg by mouth daily.    . bimatoprost (LUMIGAN) 0.01 % SOLN Place 1 drop into the right eye at bedtime as needed (glaucoma).     Marland Kitchen buPROPion (WELLBUTRIN XL) 150 MG 24 hr tablet Take 150 mg by mouth every morning.     . calcium citrate (CALCITRATE - DOSED IN MG ELEMENTAL CALCIUM) 950 MG tablet Take 400 mg of elemental calcium by mouth 2 (two) times daily.      . collagenase (SANTYL) ointment Apply 1 application topically daily.    . dabigatran (PRADAXA) 150 MG CAPS capsule Take 150 mg by mouth 2 (two) times daily.    . digoxin (LANOXIN) 0.125 MG tablet Take 0.0625 mg by mouth every morning.     . diltiazem (DILACOR XR) 180 MG 24 hr capsule Take 180 mg by mouth every morning.     . docusate  sodium (COLACE) 250 MG capsule Take 250 mg by mouth daily.    . dorzolamidel-timolol (COSOPT) 22.3-6.8 MG/ML SOLN ophthalmic solution Place 1 drop into the right eye 2 (two) times daily. For glaucoma    . escitalopram (LEXAPRO) 20 MG tablet Take 20 mg by mouth every morning.    . finasteride (PROSCAR) 5 MG tablet Take 5 mg by mouth every morning.     . furosemide (LASIX) 40 MG tablet Take 40 mg by mouth daily.    Marland Kitchen gabapentin (NEURONTIN) 300 MG capsule Take 300 mg by mouth 2 (two) times daily.     Marland Kitchen levothyroxine (SYNTHROID, LEVOTHROID) 75 MCG tablet Take 37.5 mcg by mouth daily before breakfast.    . magnesium oxide (MAG-OX) 400 MG tablet Take 400 mg by mouth every morning.     . Nutritional Supplements (PROMOD) LIQD Take 30 mLs by mouth 3 (three) times daily with meals.    . Polyethylene Glycol 3350 (MIRALAX PO) Take 17 g by mouth daily as needed (constipation).    . ranitidine (ZANTAC) 150 MG tablet Take 150 mg by mouth at bedtime.    . Rivaroxaban (XARELTO) 15 MG TABS tablet Take 15 mg by mouth 2 (two) times daily with a meal.    . Skin Protectants, Misc. (PERIGUARD) OINT Apply 1 application topically daily as needed. TO AFFECTED SITE (FOR RASH)    . Sunscreens (AVEENO DAILY MOISTURIZER) LOTN Apply 1 application topically 2 (two) times daily. To lower extremities.    . tamsulosin (FLOMAX) 0.4 MG CAPS capsule Take 0.4 mg by mouth every evening. FOR URINARY SYMPTOMS     No current facility-administered medications for this encounter.      REVIEW OF SYSTEMS: On review of systems, the patient reports that he is doing well overall. He denies any chest pain,  shortness of breath, cough, fevers, chills, night sweats, unintended weight changes.  He  denies any bowel or bladder disturbances, and denies abdominal pain, nausea or vomiting. He denies any new musculoskeletal or joint aches or pains. A complete review of systems is obtained and is otherwise negative.     PHYSICAL EXAM:  Wt Readings from Last 3 Encounters:  07/28/18 171 lb (77.6 kg)  07/25/18 171 lb 15.3 oz (78 kg)  10/07/17 146 lb (66.2 kg)   Temp Readings from Last 3 Encounters:  10/02/18 97.7 F (36.5 C)  10/02/18 97.7 F (36.5 C)  07/28/18 (!) 97 F (36.1 C)   BP Readings from Last 3 Encounters:  10/02/18 133/75  10/02/18 133/75  07/28/18 135/79   Pulse Readings from Last 3 Encounters:  10/02/18 70  10/02/18 70  07/28/18 73    In general this is a chronically ill appearing Caucasian male in no acute distress.  He is alert and oriented x4 and appropriate throughout the examination. Cardiopulmonary assessment is negative for acute distress and he exhibits normal effort.  He is wheelchair-bound, and has left hemiparesis.   ECOG = 4 0 - Asymptomatic (Fully active, able to carry on all predisease activities without restriction)  1 - Symptomatic but completely ambulatory (Restricted in physically strenuous activity but ambulatory and able to carry out work of a light or sedentary nature. For example, light housework, office work)  2 - Symptomatic, <50% in bed during the day (Ambulatory and capable of all self care but unable to carry out any work activities. Up and about more than 50% of waking hours)  3 - Symptomatic, >50% in bed, but not bedbound (Capable of  only limited self-care, confined to bed or chair 50% or more of waking hours)  4 - Bedbound (Completely disabled. Cannot carry on any self-care. Totally confined to bed or chair)  5 - Death   Eustace Pen MM, Creech RH, Tormey DC, et al. [phone]). "Toxicity and response criteria of the [facility]".  [place] Oncol. 5 (6): 649-55    LABORATORY DATA:  Lab Results  Component Value Date   WBC 6.2 07/25/2018   HGB 9.6 (L) 07/25/2018   HCT 30.2 (L) 07/25/2018   MCV 89.6 07/25/2018   PLT 271 07/25/2018   Lab Results  Component Value Date   NA 143 07/25/2018   K 3.0 (L) 07/25/2018   CL 115 (H) 07/25/2018   CO2 22 07/25/2018   Lab Results  Component Value Date   ALT 14 07/18/2018   AST 19 07/18/2018   ALKPHOS 113 07/18/2018   BILITOT 0.4 07/18/2018      RADIOGRAPHY: Nm Pet Image Initial (pi) Skull Base To Thigh  Result Date: 09/15/2018 CLINICAL DATA:  Initial treatment strategy for left upper lobe pulmonary lesion. EXAM: NUCLEAR MEDICINE PET SKULL BASE TO THIGH TECHNIQUE: 8.58 mCi F-18 FDG was injected intravenously. Full-ring PET imaging was performed from the skull base to thigh after the radiotracer. CT data was obtained and used for attenuation correction and anatomic localization. Fasting blood glucose: 127 mg/dl COMPARISON:  CT scan 08/25/2018 FINDINGS: Mediastinal blood pool activity: SUV max 1.88 NECK: No hypermetabolic lymph nodes in the neck. Incidental CT findings: none CHEST: The 16 mm sub solid lesion in the left upper lobe is hypermetabolic with SUV max of 5.57 and most consistent with primary lung neoplasm. No enlarged or hypermetabolic mediastinal or hilar lymph nodes to suggest metastatic disease. Underlying emphysematous changes but no other worrisome pulmonary nodules or areas of hypermetabolism. Incidental CT findings: Advanced atherosclerotic calcifications involving the aorta and coronary arteries. Pacer wires are stable. ABDOMEN/PELVIS: No abnormal hypermetabolic activity within the liver, pancreas, adrenal glands, or spleen. No hypermetabolic lymph nodes in the abdomen or pelvis. Incidental CT findings: Numerous bilateral renal calculi. Advanced atherosclerotic calcifications involving the aorta and branch vessels. SKELETON: No focal hypermetabolic activity to  suggest skeletal metastasis. Incidental CT findings: None IMPRESSION: 1. 16 mm left upper lobe lung lesion is hypermetabolic and most consistent with primary lung neoplasm. 2. No mediastinal or hilar lymphadenopathy or distant metastatic disease. Electronically Signed   By: Marijo Sanes M.D.   On: 09/15/2018 14:14       IMPRESSION/PLAN: 1. 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  Dr. Lisbeth Renshaw discusses the findings from his imaging including his CT and PET scan which were reviewed in multidisciplinary thoracic oncology conference this morning.  We discussed that in patients who are not surgical candidates, but an acceptable alternative to try and cure his presumed malignancy would be to consider stereotactic body radiation (SBRT).  The patient has been seen by Dr. Roxan Hockey today and appears to be a better candidate for radiotherapy given his other comorbidities and physical limitations.  Dr. Roxan Hockey has discussed with the family that these features of his medical care could compromise his recovery and potentially lead him to have delayed recovery and possibly negative impact on his cardiovascular health.  We did review the risks, benefits, short and long-term effects of radiotherapy as well as the delivery and logistics.  We discussed these would likely be treatments given over 3-5 fractions.  We will await the results of his biopsy and follow-up  with plans for treatment thereafter.  The patient and his family are in agreement. 2.   In situ pacemaker.  We will communicate with Dr. Bradd Burner regarding safety to proceed with treatment given his pacemaker.   In a visit lasting 45 minutes, greater than 50% of the time was spent face to face discussing his case, and coordinating the patient's care.   The above documentation reflects my direct findings during this shared patient visit. Please see the separate note by Dr. Lisbeth Renshaw on this date for the remainder of the patient's plan of care.    Carola Rhine, PAC

## 2018-10-08 ENCOUNTER — Encounter (HOSPITAL_COMMUNITY): Payer: Self-pay | Admitting: Physician Assistant

## 2018-10-08 NOTE — Anesthesia Preprocedure Evaluation (Deleted)
Anesthesia Evaluation    Airway        Dental   Pulmonary former smoker,           Cardiovascular hypertension,      Neuro/Psych    GI/Hepatic   Endo/Other  diabetes  Renal/GU      Musculoskeletal   Abdominal   Peds  Hematology   Anesthesia Other Findings   Reproductive/Obstetrics                             Anesthesia Physical Anesthesia Plan  ASA:   Anesthesia Plan:    Post-op Pain Management:    Induction:   PONV Risk Score and Plan:   Airway Management Planned:   Additional Equipment:   Intra-op Plan:   Post-operative Plan:   Informed Consent:   Plan Discussed with:   Anesthesia Plan Comments: (Follows with cardiology at [facility], Dr. Wyline Copas. Last seen 10/06/18 and upcoming bronchoscopy was discussed. Note in care everywhere.)        Anesthesia Quick Evaluation

## 2018-10-08 NOTE — Progress Notes (Signed)
Pre-op instructions faxed to [place], Care Coordinator. Sonya confirmed receipt of fax and will f/u with Thurmond Butts, RN, as instructed, if pt took Xarelto last night ( instructed to stop Xarelto 48 hours prior to surgery per Thurmond Butts, Therapist, sports). Peri-op prescription for ICD initiated, [employer] Representatives notified via email. Requested EKG tracing from [facility].  Anesthesia asked to review pt history.

## 2018-10-08 NOTE — Pre-Procedure Instructions (Signed)
Steven Duffy  10/08/2018      [facility] [place] [address] [place] [address] Phone: [phone] Fax: [phone]  Steven Duffy, [name] Phone: [phone] Fax: [phone]    Your procedure is scheduled on Thursday, October 09, 2018  Report to [facility] Admitting at 10:45 A.M.  Call this number if you have problems the morning of surgery:  [phone]   Remember: Follow surgeon's instructions regarding Xarelto  Do not eat or drink after midnight.  Take these medicines the morning of surgery with A SIP OF WATER: buPROPion (WELLBUTRIN XL), digoxin (LANOXIN), levothyroxine (SYNTHROID), gabapentin (NEURONTIN),  escitalopram (LEXAPRO),  diltiazem (DILACOR XR),  finasteride (PROSCAR) dorzolamidel-timolol (COSOPT) eye drops,  If needed: Tylenol  Stop taking vitamins, fish oil, and herbal medications. Do not take any NSAIDs ie: Ibuprofen, Advil, Naproxen (Aleve), Motrin, BC and Goody Powder; stop now.    How to Manage Your Diabetes Before and After Surgery  Why is it important to control my blood sugar before and after surgery? . Improving blood sugar levels before and after surgery helps healing and can limit problems. . A way of improving blood sugar control is eating a healthy diet by: o  Eating less sugar and carbohydrates o  Increasing activity/exercise o  Talking with your doctor about reaching your blood sugar goals . High blood sugars (greater than 180 mg/dL) can raise your risk of infections and slow your recovery, so you will need to focus on controlling your diabetes during the weeks before surgery. . Make sure that the doctor who takes care of your diabetes knows about your planned surgery including the date and location.  How do I manage my blood sugar before surgery? . Check your blood sugar at  least 4 times a day, starting 2 days before surgery, to make sure that the level is not too high or low. o Check your blood sugar the morning of your surgery when you wake up and every 2 hours until you get to the Short Stay unit. . If your blood sugar is less than 70 mg/dL, you will need to treat for low blood sugar: o Do not take insulin. o Treat a low blood sugar (less than 70 mg/dL) with  cup of clear juice (cranberry or apple), 4 glucose tablets, OR glucose gel. Recheck blood sugar in 15 minutes after treatment (to make sure it is greater than 70 mg/dL). If your blood sugar is not greater than 70 mg/dL on recheck, call [phone] o  for further instructions. . Report your blood sugar to the short stay nurse when you get to Short Stay.  . If you are admitted to the hospital after surgery: o Your blood sugar will be checked by the staff and you will probably be given insulin after surgery (instead of oral diabetes medicines) to make sure you have good blood sugar levels. o The goal for blood sugar control after surgery is 80-180 mg/dL.  WHAT DO I DO ABOUT MY DIABETES MEDICATION? N/A     Reviewed and Endorsed by [facility] Patient Education Committee, August 2015  Do not wear jewelry, make-up or nail polish.  Do not wear lotions, powders, or perfumes, or deodorant.  Do not shave 48 hours prior to surgery.  Men may shave face and neck.  Do not bring valuables to  the hospital.  [facility] is not responsible for any belongings or valuables.  Contacts, dentures or bridgework may not be worn into surgery.  Leave your suitcase in the car.  After surgery it may be brought to your room. Patients discharged the day of surgery will not be allowed to drive home. Please read over the following fact sheets that you were given.

## 2018-10-09 ENCOUNTER — Encounter (HOSPITAL_COMMUNITY)
Admission: RE | Disposition: A | Discharge status: Home / Self Care | Payer: Self-pay | Source: Ambulatory Visit | Attending: Thoracic Surgery (Cardiothoracic Vascular Surgery)

## 2018-10-09 ENCOUNTER — Encounter: Payer: Self-pay | Admitting: *Deleted

## 2018-10-09 ENCOUNTER — Ambulatory Visit (HOSPITAL_COMMUNITY)
Admission: RE | Admit: 2018-10-09 | Discharge: 2018-10-09 | Disposition: A | Discharge status: Home / Self Care | Payer: Medicare (Managed Care) | Source: Ambulatory Visit | Attending: Thoracic Surgery (Cardiothoracic Vascular Surgery) | Admitting: Thoracic Surgery (Cardiothoracic Vascular Surgery)

## 2018-10-09 ENCOUNTER — Other Ambulatory Visit: Payer: Self-pay | Admitting: *Deleted

## 2018-10-09 DIAGNOSIS — R911 Solitary pulmonary nodule: Secondary | ICD-10-CM | POA: Diagnosis present

## 2018-10-09 DIAGNOSIS — Z538 Procedure and treatment not carried out for other reasons: Secondary | ICD-10-CM | POA: Diagnosis not present

## 2018-10-09 SURGERY — VIDEO BRONCHOSCOPY WITH ENDOBRONCHIAL NAVIGATION
Anesthesia: General

## 2018-10-09 NOTE — Progress Notes (Signed)
Procedure cancelled due to patient eating breakfast at 0900.   Dr. Roxan Hockey informed patient and [facility] staff member that his procedure would be rescheduled.

## 2018-10-09 NOTE — Progress Notes (Signed)
Upon arrival patient stated that he ate 2 boiled eggs and 2 slices of toast at 9509TO this morning.  He also had applesauce at 0730 to take his medications.  Dr. Fransisco Beau notified and stated that's an 8 hour delay.

## 2018-10-09 NOTE — Pre-Procedure Instructions (Signed)
Steven Duffy  10/09/2018       Your procedure is scheduled on to be determined.  Office will call with date and time.  Report to [facility] Admitting   Call this number if you have problems the morning of surgery:  [phone]   Remember:  Do not eat or drink after midnight.     Take these medicines the morning of surgery with A SIP OF WATER: Acetaminophen (Tylenol) - if needed Acetazolamide (Diamox) Atropine eye drop Bupropion (Wellbutrin XL) Digoxin (Lanoxin) dorzolamidel-timolol (Cosopt) eye drops escitalopram (Lexapro) Finasteride (Proscar) Gabapentin (Neurontin) Levothyroxine (Synthroid) Pilocarpine (Pilocar) eye drop tamsulosin (Flomax)  7 days prior to surgery STOP taking any Aspirin (unless otherwise instructed by your surgeon), Aleve, Naproxen, Ibuprofen, Motrin, Advil, Goody's, BC's, all herbal medications, fish oil, and all vitamins  Follow your surgeon's instructions on when to stop Xarelto.  If no instructions were given by your surgeon then you will need to call the office to get those instructions.        Do not wear jewelry,  Do not wear lotions, powders, or colognes, or deodorant.  Do not shave 48 hours prior to surgery.  Men may shave face and neck.  Do not bring valuables to the hospital.  [facility] is not responsible for any belongings or valuables.  Contacts, dentures or bridgework may not be worn into surgery.  Leave your suitcase in the car.  After surgery it may be brought to your room.  For patients admitted to the hospital, discharge time will be determined by your treatment team.  Patients discharged the day of surgery will not be allowed to drive home.   Name and phone number of your driver:    Special instructions:   [facility]- Preparing For Surgery  Before surgery, you can play an important role. Because skin is not sterile, your skin needs to be as free of germs as possible. You can reduce the number of germs on  your skin by washing with CHG (chlorahexidine gluconate) Soap before surgery.  CHG is an antiseptic cleaner which kills germs and bonds with the skin to continue killing germs even after washing.    Oral Hygiene is also important to reduce your risk of infection.  Remember - BRUSH YOUR TEETH THE MORNING OF SURGERY WITH YOUR REGULAR TOOTHPASTE  Please do not use if you have an allergy to CHG or antibacterial soaps. If your skin becomes reddened/irritated stop using the CHG.  Do not shave (including legs and underarms) for at least 48 hours prior to first CHG shower. It is OK to shave your face.  Please follow these instructions carefully.   1. Shower the NIGHT BEFORE SURGERY and the MORNING OF SURGERY with CHG.   2. If you chose to wash your hair, wash your hair first as usual with your normal shampoo.  3. After you shampoo, rinse your hair and body thoroughly to remove the shampoo.  4. Use CHG as you would any other liquid soap. You can apply CHG directly to the skin and wash gently with a scrungie or a clean washcloth.   5. Apply the CHG Soap to your body ONLY FROM THE NECK DOWN.  Do not use on open wounds or open sores. Avoid contact with your eyes, ears, mouth and genitals (private parts). Wash Face and genitals (private parts)  with your normal soap.  6. Wash thoroughly, paying special attention to the area where your surgery will be performed.  7. Thoroughly rinse your body with warm water from the neck down.  8. DO NOT shower/wash with your normal soap after using and rinsing off the CHG Soap.  9. Pat yourself dry with a CLEAN TOWEL.  10. Wear CLEAN PAJAMAS to bed the night before surgery, wear comfortable clothes the morning of surgery  11. Place CLEAN SHEETS on your bed the night of your first shower and DO NOT SLEEP WITH PETS.    Day of Surgery: Shower as stated above Do not apply any deodorants/lotions.  Please wear clean clothes to the hospital/surgery center.    Remember to brush your teeth WITH YOUR REGULAR TOOTHPASTE.    Please read over the following fact sheets that you were given.

## 2018-10-10 NOTE — Progress Notes (Signed)
Pre-op instructions faxed to [place], Care Coordinator. Sonya confirmed receipt of faxed pre-op instructions. Becky Sax stated that last dose of Xarelto will be today. Peri-op prescription for ICD initiated, [employer] Representatives notified via email. Requested EKG tracing from [facility].

## 2018-10-10 NOTE — Pre-Procedure Instructions (Addendum)
Steven Duffy  10/10/2018      [id number] [place], [address] [place] [address] Phone: [phone] Fax: [phone]  [place], [place] [address] Phone: [phone] Fax: [phone]    Your procedure is scheduled on Monday, October 13, 2018  Report to [facility] Admitting at 9:00 A.M.  Call this number if you have problems the morning of surgery:  [phone]   Remember: Follow surgeon's instructions regarding Xarelto   Do not eat or drink after midnight Sunday, October 12, 2018   Take these medicines the morning of surgery with A SIP OF WATER: buPROPion (WELLBUTRIN XL), digoxin (LANOXIN), levothyroxine (SYNTHROID), gabapentin (NEURONTIN),  escitalopram (LEXAPRO),  diltiazem (DILACOR XR),  finasteride (PROSCAR), tamsulosin (FLOMAX), EYE DROPS If needed: Tylenol for pain Stop taking vitamins, fish oil, and herbal medications. Do not take any NSAIDs ie: Ibuprofen, Advil, Naproxen (Aleve), Motrin, BC and Goody Powder; stop now.    How to Manage Your Diabetes Before and After Surgery  Why is it important to control my blood sugar before and after surgery? . Improving blood sugar levels before and after surgery helps healing and can limit problems. . A way of improving blood sugar control is eating a healthy diet by: o  Eating less sugar and carbohydrates o  Increasing activity/exercise o  Talking with your doctor about reaching your blood sugar goals . High blood sugars (greater than 180 mg/dL) can raise your risk of infections and slow your recovery, so you will need to focus on controlling your diabetes during the weeks before surgery. . Make sure that the doctor who takes care of your diabetes knows about your planned surgery including the date and location.  How do I manage my blood sugar before surgery? . Check  your blood sugar at least 4 times a day, starting 2 days before surgery, to make sure that the level is not too high or low. o Check your blood sugar the morning of your surgery when you wake up and every 2 hours until you get to the Short Stay unit. . If your blood sugar is less than 70 mg/dL, you will need to treat for low blood sugar: o Do not take insulin. o Treat a low blood sugar (less than 70 mg/dL) with  cup of clear juice (cranberry or apple), 4 glucose tablets, OR glucose gel. Recheck blood sugar in 15 minutes after treatment (to make sure it is greater than 70 mg/dL). If your blood sugar is not greater than 70 mg/dL on recheck, call [phone] o  for further instructions. . Report your blood sugar to the short stay nurse when you get to Short Stay.  . If you are admitted to the hospital after surgery: o Your blood sugar will be checked by the staff and you will probably be given insulin after surgery (instead of oral diabetes medicines) to make sure you have good blood sugar levels. o The goal for blood sugar control after surgery is 80-180 mg/dL.  WHAT DO I DO ABOUT MY DIABETES MEDICATION? N/A     Reviewed and Endorsed by [facility] Patient Education Committee, August 2015  Do not wear jewelry, make-up or nail polish.  Do not wear lotions, powders, or perfumes, or deodorant.  Do not shave 48 hours prior to surgery.  Men may shave face and neck.  Do not bring valuables to the hospital.  [facility] is not responsible for any belongings or valuables.  Contacts, dentures or bridgework may not be worn into surgery.  Leave your suitcase in the car.  After surgery it may be brought to your room. Patients discharged the day of surgery will not be allowed to drive home. Please read over the following fact sheets that you were given.

## 2018-10-13 ENCOUNTER — Ambulatory Visit (HOSPITAL_COMMUNITY): Payer: Medicare (Managed Care) | Admitting: Anesthesiology

## 2018-10-13 ENCOUNTER — Ambulatory Visit (HOSPITAL_COMMUNITY)
Admission: RE | Admit: 2018-10-13 | Discharge: 2018-10-13 | Disposition: A | Discharge status: Home / Self Care | Payer: Medicare (Managed Care) | Source: Ambulatory Visit | Attending: Thoracic Surgery (Cardiothoracic Vascular Surgery) | Admitting: Thoracic Surgery (Cardiothoracic Vascular Surgery)

## 2018-10-13 ENCOUNTER — Encounter (HOSPITAL_COMMUNITY): Payer: Self-pay | Admitting: Anesthesiology

## 2018-10-13 ENCOUNTER — Other Ambulatory Visit: Payer: Self-pay

## 2018-10-13 ENCOUNTER — Encounter (HOSPITAL_COMMUNITY)
Admission: RE | Disposition: A | Discharge status: Home / Self Care | Payer: Self-pay | Source: Ambulatory Visit | Attending: Thoracic Surgery (Cardiothoracic Vascular Surgery)

## 2018-10-13 ENCOUNTER — Ambulatory Visit (HOSPITAL_COMMUNITY): Payer: Medicare (Managed Care)

## 2018-10-13 DIAGNOSIS — I4891 Unspecified atrial fibrillation: Secondary | ICD-10-CM | POA: Diagnosis not present

## 2018-10-13 DIAGNOSIS — Z888 Allergy status to other drugs, medicaments and biological substances status: Secondary | ICD-10-CM | POA: Insufficient documentation

## 2018-10-13 DIAGNOSIS — H547 Unspecified visual loss: Secondary | ICD-10-CM | POA: Diagnosis not present

## 2018-10-13 DIAGNOSIS — Z87891 Personal history of nicotine dependence: Secondary | ICD-10-CM | POA: Insufficient documentation

## 2018-10-13 DIAGNOSIS — Z79899 Other long term (current) drug therapy: Secondary | ICD-10-CM | POA: Insufficient documentation

## 2018-10-13 DIAGNOSIS — R911 Solitary pulmonary nodule: Secondary | ICD-10-CM

## 2018-10-13 DIAGNOSIS — Z950 Presence of cardiac pacemaker: Secondary | ICD-10-CM | POA: Diagnosis not present

## 2018-10-13 DIAGNOSIS — C3412 Malignant neoplasm of upper lobe, left bronchus or lung: Secondary | ICD-10-CM

## 2018-10-13 DIAGNOSIS — G4730 Sleep apnea, unspecified: Secondary | ICD-10-CM | POA: Insufficient documentation

## 2018-10-13 DIAGNOSIS — F329 Major depressive disorder, single episode, unspecified: Secondary | ICD-10-CM | POA: Diagnosis not present

## 2018-10-13 DIAGNOSIS — I2510 Atherosclerotic heart disease of native coronary artery without angina pectoris: Secondary | ICD-10-CM | POA: Insufficient documentation

## 2018-10-13 DIAGNOSIS — M1990 Unspecified osteoarthritis, unspecified site: Secondary | ICD-10-CM | POA: Diagnosis not present

## 2018-10-13 DIAGNOSIS — K219 Gastro-esophageal reflux disease without esophagitis: Secondary | ICD-10-CM | POA: Insufficient documentation

## 2018-10-13 DIAGNOSIS — I69354 Hemiplegia and hemiparesis following cerebral infarction affecting left non-dominant side: Secondary | ICD-10-CM | POA: Insufficient documentation

## 2018-10-13 DIAGNOSIS — I10 Essential (primary) hypertension: Secondary | ICD-10-CM | POA: Diagnosis not present

## 2018-10-13 DIAGNOSIS — Z881 Allergy status to other antibiotic agents status: Secondary | ICD-10-CM | POA: Insufficient documentation

## 2018-10-13 DIAGNOSIS — E785 Hyperlipidemia, unspecified: Secondary | ICD-10-CM | POA: Insufficient documentation

## 2018-10-13 DIAGNOSIS — I700 Atherosclerosis of aorta: Secondary | ICD-10-CM | POA: Insufficient documentation

## 2018-10-13 DIAGNOSIS — Z7901 Long term (current) use of anticoagulants: Secondary | ICD-10-CM | POA: Diagnosis not present

## 2018-10-13 DIAGNOSIS — Z79890 Hormone replacement therapy (postmenopausal): Secondary | ICD-10-CM | POA: Insufficient documentation

## 2018-10-13 DIAGNOSIS — F419 Anxiety disorder, unspecified: Secondary | ICD-10-CM | POA: Diagnosis not present

## 2018-10-13 DIAGNOSIS — Z419 Encounter for procedure for purposes other than remedying health state, unspecified: Secondary | ICD-10-CM

## 2018-10-13 DIAGNOSIS — Z880 Allergy status to penicillin: Secondary | ICD-10-CM | POA: Insufficient documentation

## 2018-10-13 DIAGNOSIS — Z87442 Personal history of urinary calculi: Secondary | ICD-10-CM | POA: Diagnosis not present

## 2018-10-13 DIAGNOSIS — Z993 Dependence on wheelchair: Secondary | ICD-10-CM | POA: Diagnosis not present

## 2018-10-13 DIAGNOSIS — H409 Unspecified glaucoma: Secondary | ICD-10-CM | POA: Diagnosis not present

## 2018-10-13 DIAGNOSIS — E1140 Type 2 diabetes mellitus with diabetic neuropathy, unspecified: Secondary | ICD-10-CM | POA: Insufficient documentation

## 2018-10-13 DIAGNOSIS — Z91048 Other nonmedicinal substance allergy status: Secondary | ICD-10-CM | POA: Insufficient documentation

## 2018-10-13 DIAGNOSIS — F1021 Alcohol dependence, in remission: Secondary | ICD-10-CM | POA: Diagnosis not present

## 2018-10-13 DIAGNOSIS — N200 Calculus of kidney: Secondary | ICD-10-CM | POA: Diagnosis not present

## 2018-10-13 HISTORY — PX: VIDEO BRONCHOSCOPY WITH ENDOBRONCHIAL NAVIGATION: SHX6175

## 2018-10-13 HISTORY — PX: FUDUCIAL PLACEMENT: SHX5083

## 2018-10-13 LAB — COMPREHENSIVE METABOLIC PANEL
ALK PHOS: 97 U/L (ref 38–126)
ALT: 18 U/L (ref 0–44)
ANION GAP: 8 (ref 5–15)
AST: 22 U/L (ref 15–41)
Albumin: 3.5 g/dL (ref 3.5–5.0)
BUN: 18 mg/dL (ref 8–23)
CALCIUM: 9.5 mg/dL (ref 8.9–10.3)
CO2: 22 mmol/L (ref 22–32)
Chloride: 114 mmol/L — ABNORMAL HIGH (ref 98–111)
Creatinine, Ser: 1.43 mg/dL — ABNORMAL HIGH (ref 0.61–1.24)
GFR calc non Af Amer: 46 mL/min — ABNORMAL LOW (ref >60)
GFR, EST AFRICAN AMERICAN: 53 mL/min — AB (ref >60)
GLUCOSE: 132 mg/dL — AB (ref 70–99)
Potassium: 4.1 mmol/L (ref 3.5–5.1)
Sodium: 144 mmol/L (ref 135–145)
TOTAL PROTEIN: 7.5 g/dL (ref 6.5–8.1)
Total Bilirubin: 0.6 mg/dL (ref 0.3–1.2)

## 2018-10-13 LAB — GLUCOSE, CAPILLARY
GLUCOSE-CAPILLARY: 126 mg/dL — AB (ref 70–99)
Glucose-Capillary: 113 mg/dL — ABNORMAL HIGH (ref 70–99)

## 2018-10-13 LAB — CBC
HCT: 40.2 % (ref 39.0–52.0)
HEMOGLOBIN: 11.7 g/dL — AB (ref 13.0–17.0)
MCH: 27 pg (ref 26.0–34.0)
MCHC: 29.1 g/dL — AB (ref 30.0–36.0)
MCV: 92.6 fL (ref 80.0–100.0)
PLATELETS: 240 10*3/uL (ref 150–400)
RBC: 4.34 MIL/uL (ref 4.22–5.81)
RDW: 16.8 % — AB (ref 11.5–15.5)
WBC: 7.2 10*3/uL (ref 4.0–10.5)
nRBC: 0 % (ref 0.0–0.2)

## 2018-10-13 LAB — HEMOGLOBIN A1C
HEMOGLOBIN A1C: 6.5 % — AB (ref 4.8–5.6)
MEAN PLASMA GLUCOSE: 139.85 mg/dL

## 2018-10-13 LAB — APTT: aPTT: 38 seconds — ABNORMAL HIGH (ref 24–36)

## 2018-10-13 LAB — PROTIME-INR
INR: 1.21
PROTHROMBIN TIME: 15.2 s (ref 11.4–15.2)

## 2018-10-13 SURGERY — VIDEO BRONCHOSCOPY WITH ENDOBRONCHIAL NAVIGATION
Anesthesia: General | Laterality: Left

## 2018-10-13 MED ORDER — 0.9 % SODIUM CHLORIDE (POUR BTL) OPTIME
TOPICAL | Status: DC | PRN
  Administered 2018-10-13: 1000 mL

## 2018-10-13 MED ORDER — FENTANYL CITRATE (PF) 100 MCG/2ML IJ SOLN: 25.0000 ug | INTRAMUSCULAR | Status: DC | PRN

## 2018-10-13 MED ORDER — DEXAMETHASONE SODIUM PHOSPHATE 10 MG/ML IJ SOLN
INTRAMUSCULAR | Status: DC | PRN
  Administered 2018-10-13: 10 mg via INTRAVENOUS

## 2018-10-13 MED ORDER — FENTANYL CITRATE (PF) 250 MCG/5ML IJ SOLN
INTRAMUSCULAR | Status: DC | PRN
  Administered 2018-10-13: 50 ug via INTRAVENOUS

## 2018-10-13 MED ORDER — LIDOCAINE 2% (20 MG/ML) 5 ML SYRINGE
INTRAMUSCULAR | Status: DC | PRN
  Administered 2018-10-13: 80 mg via INTRAVENOUS

## 2018-10-13 MED ORDER — PROPOFOL 10 MG/ML IV BOLUS
INTRAVENOUS | Status: DC | PRN
  Administered 2018-10-13: 100 mg via INTRAVENOUS
  Administered 2018-10-13: 50 mg via INTRAVENOUS

## 2018-10-13 MED ORDER — SUGAMMADEX SODIUM 200 MG/2ML IV SOLN
INTRAVENOUS | Status: DC | PRN
  Administered 2018-10-13: 150.6 mg via INTRAVENOUS

## 2018-10-13 MED ORDER — FENTANYL CITRATE (PF) 250 MCG/5ML IJ SOLN
INTRAMUSCULAR | Status: AC
  Filled 2018-10-13: qty 5

## 2018-10-13 MED ORDER — ROCURONIUM BROMIDE 10 MG/ML (PF) SYRINGE
PREFILLED_SYRINGE | INTRAVENOUS | Status: DC | PRN
  Administered 2018-10-13: 50 mg via INTRAVENOUS

## 2018-10-13 MED ORDER — PHENYLEPHRINE 40 MCG/ML (10ML) SYRINGE FOR IV PUSH (FOR BLOOD PRESSURE SUPPORT)
PREFILLED_SYRINGE | INTRAVENOUS | Status: DC | PRN
  Administered 2018-10-13 (×2): 120 ug via INTRAVENOUS
  Administered 2018-10-13: 160 ug via INTRAVENOUS

## 2018-10-13 MED ORDER — LACTATED RINGERS IV SOLN
INTRAVENOUS | Status: DC
  Administered 2018-10-13: 11:00:00 via INTRAVENOUS

## 2018-10-13 MED ORDER — ONDANSETRON HCL 4 MG/2ML IJ SOLN
INTRAMUSCULAR | Status: DC | PRN
  Administered 2018-10-13: 4 mg via INTRAVENOUS

## 2018-10-13 MED ORDER — SODIUM CHLORIDE 0.9 % IV SOLN
INTRAVENOUS | Status: DC | PRN
  Administered 2018-10-13: 20 ug/min via INTRAVENOUS

## 2018-10-13 SURGICAL SUPPLY — 47 items
ADAPTER BRONCHOSCOPE OLYMPUS (ADAPTER) ×3 IMPLANT
ADAPTER VALVE BIOPSY EBUS (MISCELLANEOUS) IMPLANT
ADPTR VALVE BIOPSY EBUS (MISCELLANEOUS)
BRUSH BIOPSY BRONCH 10 SDTNB (MISCELLANEOUS) IMPLANT
BRUSH BIOPSY BRONCH 10MM SDTNB (MISCELLANEOUS)
BRUSH SUPERTRAX BIOPSY (INSTRUMENTS) IMPLANT
BRUSH SUPERTRAX NDL-TIP CYTO (INSTRUMENTS) ×3 IMPLANT
CANISTER SUCT 3000ML PPV (MISCELLANEOUS) ×3 IMPLANT
CHANNEL WORK EXTEND EDGE 180 (KITS) IMPLANT
CHANNEL WORK EXTEND EDGE 90 (KITS) IMPLANT
CONT SPEC 4OZ CLIKSEAL STRL BL (MISCELLANEOUS) ×6 IMPLANT
COVER BACK TABLE 60X90IN (DRAPES) ×3 IMPLANT
COVER WAND RF STERILE (DRAPES) IMPLANT
FILTER STRAW FLUID ASPIR (MISCELLANEOUS) IMPLANT
FORCEPS BIOP SUPERTRX PREMAR (INSTRUMENTS) ×3 IMPLANT
GAUZE SPONGE 4X4 12PLY STRL (GAUZE/BANDAGES/DRESSINGS) ×3 IMPLANT
GLOVE SURG SIGNA 7.5 PF LTX (GLOVE) ×3 IMPLANT
GOWN STRL REUS W/ TWL LRG LVL3 (GOWN DISPOSABLE) ×1 IMPLANT
GOWN STRL REUS W/ TWL XL LVL3 (GOWN DISPOSABLE) ×1 IMPLANT
GOWN STRL REUS W/TWL LRG LVL3 (GOWN DISPOSABLE) ×2
GOWN STRL REUS W/TWL XL LVL3 (GOWN DISPOSABLE) ×2
KIT CLEAN ENDO COMPLIANCE (KITS) ×3 IMPLANT
KIT MARKER FIDUCIAL DELIVERY (KITS) ×3 IMPLANT
KIT PROCEDURE EDGE 180 (KITS) ×3 IMPLANT
KIT PROCEDURE EDGE 90 (KITS) IMPLANT
KIT TURNOVER KIT B (KITS) ×3 IMPLANT
MARKER FIDUCIAL SL NIT COIL (Implant Marker) ×9 IMPLANT
MARKER SKIN DUAL TIP RULER LAB (MISCELLANEOUS) ×3 IMPLANT
NEEDLE SUPERTRX PREMARK BIOPSY (NEEDLE) ×3 IMPLANT
NS IRRIG 1000ML POUR BTL (IV SOLUTION) ×3 IMPLANT
OIL SILICONE PENTAX (PARTS (SERVICE/REPAIRS)) ×3 IMPLANT
PAD ARMBOARD 7.5X6 YLW CONV (MISCELLANEOUS) ×6 IMPLANT
PATCHES PATIENT (LABEL) ×9 IMPLANT
SYR 20CC LL (SYRINGE) ×3 IMPLANT
SYR 20ML ECCENTRIC (SYRINGE) ×3 IMPLANT
SYR 30ML LL (SYRINGE) IMPLANT
SYR 5ML LL (SYRINGE) IMPLANT
TOWEL GREEN STERILE (TOWEL DISPOSABLE) IMPLANT
TOWEL GREEN STERILE FF (TOWEL DISPOSABLE) ×3 IMPLANT
TRAP SPECIMEN MUCOUS 40CC (MISCELLANEOUS) ×3 IMPLANT
TUBE CONNECTING 20'X1/4 (TUBING) ×1
TUBE CONNECTING 20X1/4 (TUBING) ×2 IMPLANT
UNDERPAD 30X30 (UNDERPADS AND DIAPERS) ×3 IMPLANT
VALVE BIOPSY  SINGLE USE (MISCELLANEOUS) ×2
VALVE BIOPSY SINGLE USE (MISCELLANEOUS) ×1 IMPLANT
VALVE SUCTION BRONCHIO DISP (MISCELLANEOUS) ×3 IMPLANT
WATER STERILE IRR 1000ML POUR (IV SOLUTION) ×3 IMPLANT

## 2018-10-13 NOTE — Op Note (Signed)
NAME: Steven Duffy, Steven Duffy MEDICAL RECORD [id number] ACCOUNT [id number] DATE OF BIRTH:08-28-1941 FACILITY: [facility] LOCATION: [facility] PHYSICIAN:Quindon Denker Chaya Jan, MD  OPERATIVE REPORT  DATE OF PROCEDURE:  10/13/2018  PREOPERATIVE DIAGNOSIS:  Left upper lobe nodule.  POSTOPERATIVE DIAGNOSIS:  Nonsmall cell carcinoma, left upper lobe, clinical stage IA (T1, N0).  PROCEDURES:  Electromagnetic navigational bronchoscopy with needle aspirations, brushings and transbronchial biopsies and fiducial placement.  SURGEON:  Modesto Charon, MD  ASSISTANT:  None.  ANESTHESIA:  General.  FINDINGS:  Navigated within 8 mm of the nodule.  Aspirations and brushings showed nonsmall-cell carcinoma, probable adenocarcinoma.  CLINICAL NOTE:  The patient is a 77 year old gentleman with multiple severe medical problems who has been followed for a ground-glass opacity in the left upper lobe.  Over time, this nodule has enlarged and now has a solid component.  It was  hypermetabolic on PET CT.  There was no evidence of regional or distant metastasis.  He was advised to undergo navigational bronchoscopy for biopsy and fiducial placement prior to radiation therapy.  The indications, risks, benefits, and alternatives  were discussed in detail with the patient.  He understood and accepted the risks and agreed to proceed.  DESCRIPTION OF PROCEDURE:  The patient was brought to the operating room on 10/13/2018.  He had induction of general anesthesia and was intubated.  Flexible fiberoptic bronchoscopy was performed via the endotracheal tube.  It revealed normal  endobronchial anatomy with no endobronchial lesions to the level of the subsegmental bronchi.  The locatable guide for navigation was placed and registration was performed.  There was good correlation of the video and virtual bronchoscopy.  The bronchoscope  then was directed to the left upper lobe bronchus and the appropriate subsegmental bronchus was  cannulated with the locatable guide, which was advanced within 8 mm of the nodule with good alignment.  Needle aspirations were performed followed by needle  brushings.  After every 2 to 3  Samplings, the locatable guide was reinserted to ensure continued good proximity and alignment with the nodule.  All sampling was done under fluoroscopy.  Quick preps were done on the needle aspirations and brushings.  While  awaiting those results, multiple biopsies were obtained.  These were all sent for permanent pathology.  The quick preps showed nonsmall-cell carcinoma, favor adenocarcinoma.    Mapping then was done for fiducial placements and the computer selected sites for fiducial placement.  Three fiducials were placed at the sites as indicated by the computer.  The sheath then was removed.  Final inspection was made with the bronchoscope.   There was no ongoing bleeding.  Fluoroscopy showed no evidence of pneumothorax.    The total fluoroscopy time was 3.4 minutes with a dose of 15.44 milligrays.    The patient then was extubated in the operating room and taken to the [facility] in good condition.  AN/NUANCE  D:10/13/2018 T:10/13/2018 JOB:[id number]

## 2018-10-13 NOTE — Interval H&P Note (Signed)
History and Physical Interval Note:  10/13/2018 12:55 PM  Steven Duffy  has presented today for surgery, with the diagnosis of LUL NODULE  The various methods of treatment have been discussed with the patient and family. After consideration of risks, benefits and other options for treatment, the patient has consented to  Procedure(s): [place] (N/A) PLACEMENT OF FUDUCIAL (N/A) as a surgical intervention .  The patient's history has been reviewed, patient examined, no change in status, stable for surgery.  I have reviewed the patient's chart and labs.  Questions were answered to the patient's satisfaction.     Melrose Nakayama

## 2018-10-13 NOTE — Anesthesia Preprocedure Evaluation (Addendum)
Anesthesia Evaluation  Patient identified by MRN, date of birth, ID band Patient awake    Reviewed: Allergy & Precautions, NPO status , Patient's Chart, lab work & pertinent test results  Airway Mallampati: III  TM Distance: >3 FB Neck ROM: Limited  Mouth opening: Limited Mouth Opening  Dental no notable dental hx. (+) Teeth Intact, Dental Advisory Given   Pulmonary sleep apnea (BiPAP) , former smoker,    Pulmonary exam normal breath sounds clear to auscultation       Cardiovascular hypertension, Pt. on medications + CAD  Normal cardiovascular exam+ dysrhythmias Atrial Fibrillation + pacemaker (for bradycardia)  Rhythm:Irregular Rate:Normal  TTE 06/2018 - Left ventricle: The cavity size was normal. Wall thickness was increased in a pattern of moderate LVH. Systolic function was normal. The estimated ejection fraction was in the range of 50% to 55%. Wall motion was normal; there were no regional wall motion abnormalities. The study is not technically sufficient to allow evaluation of LV diastolic function. - Right ventricle: The cavity size was mildly dilated. Impressions: - Technically difficult; no apical views; low normal LV systolic function; moderate LVH; mild RVE.   Neuro/Psych  Headaches, PSYCHIATRIC DISORDERS Anxiety Depression CVA (left sided hemiparesis), Residual Symptoms    GI/Hepatic Neg liver ROS, GERD  ,  Endo/Other  negative endocrine ROSdiabetes, Type 2  Renal/GU Renal disease (CKD Stage 3)  negative genitourinary   Musculoskeletal negative musculoskeletal ROS (+)   Abdominal   Peds  Hematology  (+) Blood dyscrasia, anemia ,   Anesthesia Other Findings Non small cell lung cancer   On xarelto  Reproductive/Obstetrics                          Anesthesia Physical Anesthesia Plan  ASA: III  Anesthesia Plan: General   Post-op Pain Management:    Induction: Intravenous  PONV  Risk Score and Plan: 2 and Treatment may vary due to age or medical condition and Ondansetron  Airway Management Planned: Oral ETT and Video Laryngoscope Planned  Additional Equipment: Arterial line  Intra-op Plan:   Post-operative Plan: Extubation in OR  Informed Consent: I have reviewed the patients History and Physical, chart, labs and discussed the procedure including the risks, benefits and alternatives for the proposed anesthesia with the patient or authorized representative who has indicated his/her understanding and acceptance.   Dental advisory given  Plan Discussed with: CRNA  Anesthesia Plan Comments:        Anesthesia Quick Evaluation

## 2018-10-13 NOTE — Transfer of Care (Signed)
Immediate Anesthesia Transfer of Care Note  Patient: Steven Duffy  Procedure(s) Performed: VIDEO BRONCHOSCOPY WITH ENDOBRONCHIAL NAVIGATION (Left ) PLACEMENT OF FUDUCIAL TIMES THREE LEFT UPPER LOBE LUNG NODULE (Left )  Patient Location: PACU  Anesthesia Type:General  Level of Consciousness: awake, drowsy and patient cooperative  Airway & Oxygen Therapy: Patient Spontanous Breathing and Patient connected to face mask oxygen  Post-op Assessment: Report given to RN and Post -op Vital signs reviewed and stable  Post vital signs: Reviewed and stable  Last Vitals:  Vitals Value Taken Time  BP 131/67 10/13/2018  2:28 PM  Temp    Pulse 70 10/13/2018  2:29 PM  Resp 18 10/13/2018  2:29 PM  SpO2 100 % 10/13/2018  2:29 PM  Vitals shown include unvalidated device data.  Last Pain:  Vitals:   10/13/18 1008  TempSrc:   PainSc: 3          Complications: No apparent anesthesia complications

## 2018-10-13 NOTE — Discharge Instructions (Addendum)
You may cough up small amounts of blood over the next few days  Resume Xarelto tomorrow evening  Follow up as scheduled with Dr. Lisbeth Renshaw  Call [phone] if you develop chest pain, shortness of breath, fever > 101 F or cough up more than 2 tablespoons of blood.   Post Anesthesia Home Care Instructions  Activity: Get plenty of rest for the remainder of the day. A responsible individual must stay with you for 24 hours following the procedure.  For the next 24 hours, DO NOT: -Drive a car -Paediatric nurse -Drink alcoholic beverages -Take any medication unless instructed by your physician -Make any legal decisions or sign important papers.  Meals: Start with liquid foods such as gelatin or soup. Progress to regular foods as tolerated. Avoid greasy, spicy, heavy foods. If nausea and/or vomiting occur, drink only clear liquids until the nausea and/or vomiting subsides. Call your physician if vomiting continues.  Special Instructions/Symptoms: Your throat may feel dry or sore from the anesthesia or the breathing tube placed in your throat during surgery. If this causes discomfort, gargle with warm salt water. The discomfort should disappear within 24 hours.  If you had a scopolamine patch placed behind your ear for the management of post- operative nausea and/or vomiting:  1. The medication in the patch is effective for 72 hours, after which it should be removed.  Wrap patch in a tissue and discard in the trash. Wash hands thoroughly with soap and water. 2. You may remove the patch earlier than 72 hours if you experience unpleasant side effects which may include dry mouth, dizziness or visual disturbances. 3. Avoid touching the patch. Wash your hands with soap and water after contact with the patch.

## 2018-10-13 NOTE — Progress Notes (Signed)
Report given to [facility] RN as caregiver

## 2018-10-13 NOTE — Anesthesia Procedure Notes (Signed)
Procedure Name: Intubation Date/Time: 10/13/2018 1:25 PM Performed by: Renato Shin, CRNA Pre-anesthesia Checklist: Patient identified, Emergency Drugs available, Suction available and Patient being monitored Patient Re-evaluated:Patient Re-evaluated prior to induction Oxygen Delivery Method: Circle system utilized Preoxygenation: Pre-oxygenation with 100% oxygen Induction Type: IV induction Ventilation: Mask ventilation without difficulty Laryngoscope Size: Glidescope and 3 Grade View: Grade I Tube type: Oral Tube size: 8.5 mm Number of attempts: 1 Airway Equipment and Method: Rigid stylet Placement Confirmation: ETT inserted through vocal cords under direct vision,  positive ETCO2,  CO2 detector and breath sounds checked- equal and bilateral Secured at: 21 cm Tube secured with: Tape Dental Injury: Teeth and Oropharynx as per pre-operative assessment

## 2018-10-13 NOTE — Anesthesia Procedure Notes (Signed)
Arterial Line Insertion Start/End11/03/2018 10:45 AM, 10/13/2018 10:55 AM Performed by: Moshe Salisbury, CRNA, CRNA  Patient location: Pre-op. Preanesthetic checklist: patient identified, IV checked, site marked, risks and benefits discussed, surgical consent, monitors and equipment checked, pre-op evaluation, timeout performed and anesthesia consent Lidocaine 1% used for infiltration Left, radial was placed Catheter size: 20 G Hand hygiene performed , maximum sterile barriers used  and Seldinger technique used Allen's test indicative of satisfactory collateral circulation Attempts: 2 Procedure performed without using ultrasound guided technique. Following insertion, dressing applied and Biopatch. Post procedure assessment: normal  Patient tolerated the procedure well with no immediate complications.

## 2018-10-14 ENCOUNTER — Encounter (HOSPITAL_COMMUNITY): Payer: Self-pay | Admitting: Thoracic Surgery (Cardiothoracic Vascular Surgery)

## 2018-10-14 ENCOUNTER — Encounter (HOSPITAL_BASED_OUTPATIENT_CLINIC_OR_DEPARTMENT_OTHER): Payer: Medicare (Managed Care) | Attending: Internal Medicine

## 2018-10-14 DIAGNOSIS — N186 End stage renal disease: Secondary | ICD-10-CM | POA: Insufficient documentation

## 2018-10-14 DIAGNOSIS — G4730 Sleep apnea, unspecified: Secondary | ICD-10-CM | POA: Insufficient documentation

## 2018-10-14 DIAGNOSIS — Z881 Allergy status to other antibiotic agents status: Secondary | ICD-10-CM | POA: Insufficient documentation

## 2018-10-14 DIAGNOSIS — T8131XA Disruption of external operation (surgical) wound, not elsewhere classified, initial encounter: Secondary | ICD-10-CM | POA: Insufficient documentation

## 2018-10-14 DIAGNOSIS — Y838 Other surgical procedures as the cause of abnormal reaction of the patient, or of later complication, without mention of misadventure at the time of the procedure: Secondary | ICD-10-CM | POA: Insufficient documentation

## 2018-10-14 DIAGNOSIS — E1122 Type 2 diabetes mellitus with diabetic chronic kidney disease: Secondary | ICD-10-CM | POA: Insufficient documentation

## 2018-10-14 DIAGNOSIS — I2510 Atherosclerotic heart disease of native coronary artery without angina pectoris: Secondary | ICD-10-CM | POA: Insufficient documentation

## 2018-10-14 DIAGNOSIS — Z880 Allergy status to penicillin: Secondary | ICD-10-CM | POA: Insufficient documentation

## 2018-10-14 DIAGNOSIS — B9562 Methicillin resistant Staphylococcus aureus infection as the cause of diseases classified elsewhere: Secondary | ICD-10-CM | POA: Insufficient documentation

## 2018-10-14 DIAGNOSIS — C3490 Malignant neoplasm of unspecified part of unspecified bronchus or lung: Secondary | ICD-10-CM | POA: Insufficient documentation

## 2018-10-14 DIAGNOSIS — B965 Pseudomonas (aeruginosa) (mallei) (pseudomallei) as the cause of diseases classified elsewhere: Secondary | ICD-10-CM | POA: Insufficient documentation

## 2018-10-14 NOTE — Anesthesia Postprocedure Evaluation (Signed)
Anesthesia Post Note  Patient: Steven Duffy  Procedure(s) Performed: VIDEO BRONCHOSCOPY WITH ENDOBRONCHIAL NAVIGATION (Left ) PLACEMENT OF FUDUCIAL TIMES THREE LEFT UPPER LOBE LUNG NODULE (Left )     Patient location during evaluation: PACU Anesthesia Type: General Level of consciousness: awake and alert Pain management: pain level controlled Vital Signs Assessment: post-procedure vital signs reviewed and stable Respiratory status: spontaneous breathing, nonlabored ventilation, respiratory function stable and patient connected to nasal cannula oxygen Cardiovascular status: blood pressure returned to baseline and stable Postop Assessment: no apparent nausea or vomiting Anesthetic complications: no    Last Vitals:  Vitals:   10/13/18 1528 10/13/18 1545  BP: 121/65 133/69  Pulse: 70 70  Resp: 14 12  Temp:    SpO2: 97% 100%    Last Pain:  Vitals:   10/13/18 1545  TempSrc:   PainSc: 0-No pain   Pain Goal:                 Sholanda Croson L Adiva Boettner

## 2018-10-16 ENCOUNTER — Telehealth: Payer: Self-pay | Admitting: Radiation Oncology

## 2018-10-16 NOTE — Telephone Encounter (Signed)
LM for the patient's wife to call me back regarding biopsy results and discussion of upcoming plan.

## 2018-10-16 NOTE — Telephone Encounter (Signed)
I spoke with Steven Duffy and we discussed the biopsy results. She is not sure if [name] would want radiation and he is out of the house for the afternoon. I offered her a follow up appointment next week so they can come in and discuss this further.

## 2018-10-17 NOTE — Progress Notes (Addendum)
Thoracic Location of Tumor / Histology:Neoplasm of uncertain behavior of left upper lobe of lung   Patient presented {numbers  months ago with symptoms of:  In March 2018 he was being evaluated for chest pain and shortness of breath. He had a CT of the chest which showed a groundglass opacity in the left upper lobe.  He had another CT in September 2019 which showed the left upper lobe nodule was enlarged to 28 mm and now had a 14 mm solid component.  On PET/CT the nodule was hypermetabolic with an SUV of 3.81.  There was no hypermetabolic adenopathy or evidence of distant metastases.  Biopsies of  (if applicable) revealed: INAL DIAGNOSIS Diagnosis 10-13-18 Lung, biopsy, left upper lobe - FOCI OF POORLY DIFFERENTIATED LUNG ADENOCARCINOMA. SEE NOTE. Diagnosis Note The amount of tumor is scant in the submitted specimens. Immunohistochemical stains show that the tumor cells are positive for TTF-1 (diffuse), p63 (focal, nonspecific) and negative for CK5/6, consistent with the above diagnosis. Dr. Jeannie Done has reviewed this case and concurs with the above interpretation. Dr. Roxan Hockey was paged on 10/15/2018. (NK:kh 10/15/18) Corliss Blacker   Tobacco/Marijuana/Snuff/ETOH use: Former smoker  No alcohol, drug or smokeless tobacco usage  Past/Anticipated interventions by cardiothoracic surgery, if any: Dr. 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  Completely disabled, no self care, confined to bed or chair  Prior radiation? :No  Pacemaker/ICD?: Yes  [employer] 10-17-18 Faxed pacemaker form to [facility]  Dr. Adrian Prows fax [phone] office [phone] 10-24-18 Information scanned into the computer  Possible current pregnancy?:No  Is the patient on methotrexate? :No  Current Complaints / other details:    Dr. Barney Drain PCP  PET scan on 09/15/2018 revealed activity with an SUV of 5, suspicious for disease.  No evidence of adenopathy elsewhere in the chest was appreciated or elsewhere in the remainder of his body  08-26-18 CT Chest WO Contrast  Dr. Barney Drain  scan of the chest which revealed a 14 x 27 mm lesion in the left upper lobe. BP (!) 151/94 (BP Location: Right Wrist, Patient Position: Sitting)   Pulse 68   Temp 97.7 F (36.5 C) (Oral)   Resp 20   Ht 5\' 7"  (1.702 m)   Wt 157 lb (71.2 kg)   SpO2 100%   BMI 24.59 kg/m

## 2018-10-20 ENCOUNTER — Other Ambulatory Visit (HOSPITAL_COMMUNITY)
Admission: RE | Admit: 2018-10-20 | Discharge: 2018-10-20 | Disposition: A | Discharge status: Home / Self Care | Payer: Medicare (Managed Care) | Source: Other Acute Inpatient Hospital | Attending: Internal Medicine | Admitting: Internal Medicine

## 2018-10-20 DIAGNOSIS — Y838 Other surgical procedures as the cause of abnormal reaction of the patient, or of later complication, without mention of misadventure at the time of the procedure: Secondary | ICD-10-CM | POA: Diagnosis not present

## 2018-10-20 DIAGNOSIS — B965 Pseudomonas (aeruginosa) (mallei) (pseudomallei) as the cause of diseases classified elsewhere: Secondary | ICD-10-CM | POA: Diagnosis not present

## 2018-10-20 DIAGNOSIS — G4730 Sleep apnea, unspecified: Secondary | ICD-10-CM | POA: Diagnosis not present

## 2018-10-20 DIAGNOSIS — Z881 Allergy status to other antibiotic agents status: Secondary | ICD-10-CM | POA: Diagnosis not present

## 2018-10-20 DIAGNOSIS — T8131XA Disruption of external operation (surgical) wound, not elsewhere classified, initial encounter: Secondary | ICD-10-CM | POA: Diagnosis not present

## 2018-10-20 DIAGNOSIS — E1122 Type 2 diabetes mellitus with diabetic chronic kidney disease: Secondary | ICD-10-CM | POA: Diagnosis not present

## 2018-10-20 DIAGNOSIS — C3490 Malignant neoplasm of unspecified part of unspecified bronchus or lung: Secondary | ICD-10-CM | POA: Diagnosis not present

## 2018-10-20 DIAGNOSIS — I2510 Atherosclerotic heart disease of native coronary artery without angina pectoris: Secondary | ICD-10-CM | POA: Diagnosis not present

## 2018-10-20 DIAGNOSIS — N186 End stage renal disease: Secondary | ICD-10-CM | POA: Diagnosis not present

## 2018-10-20 DIAGNOSIS — E11621 Type 2 diabetes mellitus with foot ulcer: Secondary | ICD-10-CM | POA: Insufficient documentation

## 2018-10-20 DIAGNOSIS — B9562 Methicillin resistant Staphylococcus aureus infection as the cause of diseases classified elsewhere: Secondary | ICD-10-CM | POA: Diagnosis not present

## 2018-10-20 DIAGNOSIS — Z880 Allergy status to penicillin: Secondary | ICD-10-CM | POA: Diagnosis not present

## 2018-10-22 ENCOUNTER — Ambulatory Visit
Admission: RE | Admit: 2018-10-22 | Discharge: 2018-10-22 | Disposition: A | Discharge status: Home / Self Care | Payer: Medicare (Managed Care) | Source: Ambulatory Visit | Attending: Radiation Oncology | Admitting: Radiation Oncology

## 2018-10-22 ENCOUNTER — Other Ambulatory Visit: Payer: Self-pay

## 2018-10-22 ENCOUNTER — Encounter: Payer: Self-pay | Admitting: Radiation Oncology

## 2018-10-22 ENCOUNTER — Encounter: Payer: Self-pay | Admitting: *Deleted

## 2018-10-22 VITALS — BP 151/94 | HR 68 | Temp 97.7°F | Resp 20 | Ht 67.0 in | Wt 157.0 lb

## 2018-10-22 DIAGNOSIS — C3412 Malignant neoplasm of upper lobe, left bronchus or lung: Secondary | ICD-10-CM

## 2018-10-22 DIAGNOSIS — Z87891 Personal history of nicotine dependence: Secondary | ICD-10-CM | POA: Diagnosis not present

## 2018-10-22 DIAGNOSIS — Z950 Presence of cardiac pacemaker: Secondary | ICD-10-CM | POA: Insufficient documentation

## 2018-10-22 DIAGNOSIS — Z7901 Long term (current) use of anticoagulants: Secondary | ICD-10-CM | POA: Diagnosis not present

## 2018-10-22 DIAGNOSIS — Z510 Encounter for antineoplastic radiation therapy: Secondary | ICD-10-CM | POA: Insufficient documentation

## 2018-10-22 DIAGNOSIS — Z993 Dependence on wheelchair: Secondary | ICD-10-CM | POA: Insufficient documentation

## 2018-10-22 DIAGNOSIS — Z79899 Other long term (current) drug therapy: Secondary | ICD-10-CM | POA: Insufficient documentation

## 2018-10-22 NOTE — Progress Notes (Signed)
Radiation Oncology         [phone] ________________________________  Name: Steven Duffy        MRN: [id number]  Date of Service: 10/22/2018 DOB: 07-26-1941  TD:VVOHYWV, [name], MD  Janifer Adie, MD     REFERRING PHYSICIAN: Janifer Adie, MD   DIAGNOSIS: The encounter diagnosis was Malignant neoplasm of upper lobe of left lung ([place]).   HISTORY OF PRESENT ILLNESS: Steven Duffy is a 77 y.o. male originally seen at the request of Dr. Bradd Burner for a newly noted LUL lesion in the multidisciplinary thoracic oncology clinic.  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  He has sick sinus syndrome requiring a pacemaker, as well as other medical comorbidities including diabetes, coronary disease, atrial fibrillation, and was in the process of being worked up for chest pain or shortness of breath concerning for possible CHF and onset chest x-ray was found to have what appeared to be a mass in the left upper lobe.  On 08/25/2018 he underwent CT scan of the chest which revealed a 14 x 27 mm lesion in the left upper lobe.  A PET scan on 09/15/2018 revealed activity with an SUV of 5, suspicious for disease.  No evidence of adenopathy elsewhere in the chest was appreciated or elsewhere in the remainder of his body.  While he did undergo PFTs and these were acceptable to consider surgery, however after meeting Dr. Roxan Hockey, he was not felt to be a good candidate for lobectomy. Rather he was counseled on the role of stereotactic body radiotherapy and underwent a biopsy of the LUL lesion via bronchoscopic approach on 10/13/18 and this revealed an adenocarcinoma. The patient comes today to discuss treatment recommendations with stereotactic body radiotherapy.   PREVIOUS RADIATION THERAPY: No   PAST MEDICAL HISTORY:  Past Medical History:  Diagnosis Date  . Alcohol dependence in remission ([place])   . Allergic rhinitis   . Anemia   .  Anxiety    chronic  . Arthritis    osteoarthritis   . Atrial fibrillation ([place])   . Blind   . BPH (benign prostatic hypertrophy) with urinary obstruction   . Brachial neuritis or radiculitis   . Carotid bruit   . Colon polyps    hx of   . Coronary artery disease    non obstructive per cath 2005   . Depression   . Dermatitis   . Diabetes mellitus without complication ([place])    type II  . GERD (gastroesophageal reflux disease)   . Glaucoma   . Headache    hx of migraines   . Hematuria    hx of   . Hemorrhoids   . History of kidney stones   . Hyperlipidemia   . Hypertension   . Kidney stones    stage 3  . Lumbago   . Lumbar radiculopathy   . Neuropathy   . Neuropathy due to medical condition ([place])   . Nontraumatic intracranial hemorrhage ([place])   . Occasional tremors    right arm   . Pharyngitis    hx of   . Pneumonia    hx of legionnaires in 2003  . Presence of permanent cardiac pacemaker    [employer]   . Prostatitis   . Rotator cuff tear   . Sinus bradycardia    required pacer   . Sleep apnea    bipap  . Stroke [facility])    left side weakness   .  Ventricular tachycardia ([facility])    hx of   . Vitreous hemorrhage ([place])        PAST SURGICAL HISTORY: Past Surgical History:  Procedure Laterality Date  . AMPUTATION Left 07/20/2018   Procedure: Debridement of left achilles tendon and calcaneous and left heel ulceration;  Surgeon: Dorna Leitz, MD;  Location: [facility];  Service: Orthopedics;  Laterality: Left;  . cataract surgery      left   . CIRCUMCISION    . COLONOSCOPY    . CYSTOSCOPY     multiple   . CYSTOSCOPY WITH RETROGRADE PYELOGRAM, URETEROSCOPY AND STENT PLACEMENT Right 07/04/2015   Procedure: RIGHT RETROGRADE PYELOGRAM,  RIGHT URETEROSCOPY ;  Surgeon: Kathie Rhodes, MD;  [facility];  Service: Urology;  Laterality: Right;  . CYSTOSCOPY/RETROGRADE/URETEROSCOPY/STONE EXTRACTION WITH BASKET Right 01/04/2017   Procedure:  CYSTOSCOPY/RETROGRADE/URETEROSCOPY/HOLMIUM LASER/STONE EXTRACTION WITH BASKET/ DOUBLE J STENT;  Surgeon: Kathie Rhodes, MD;  [facility];  Service: Urology;  Laterality: Right;  . FUDUCIAL PLACEMENT Left 10/13/2018   Procedure: PLACEMENT OF FUDUCIAL TIMES THREE LEFT UPPER LOBE LUNG NODULE;  Surgeon: Melrose Nakayama, MD;  Location: [place];  Service: Thoracic;  Laterality: Left;  . hemilaminectomy    . HOLMIUM LASER APPLICATION Right 2/77/8242   Procedure: HOLMIUM LASER LITHOTRIPSY AND STENT PLACEMENT ;  Surgeon: Kathie Rhodes, MD;  [facility];  Service: Urology;  Laterality: Right;  . INTRAOCULAR LENS INSERTION    . IR URETERAL STENT RIGHT NEW ACCESS W/O SEP NEPHROSTOMY CATH  10/07/2017  . IR URETERAL STENT RIGHT NEW ACCESS W/O SEP NEPHROSTOMY CATH  10/07/2017  . neck surgery in 1995 (neck broken)    . NEPHROLITHOTOMY Right 10/07/2017   Procedure: NEPHROLITHOTOMY PERCUTANEOUS;  Surgeon: Kathie Rhodes, MD;  [facility];  Service: Urology;  Laterality: Right;  . placement of gastrostomy tube      hx of   . PP vitrectomy     . rhinologic surgery     . SHOULDER SURGERY    . TRACHEOSTOMY     hx of   . URETHROPLASTY    . VASECTOMY    . VIDEO BRONCHOSCOPY WITH ENDOBRONCHIAL NAVIGATION Left 10/13/2018   Procedure: VIDEO BRONCHOSCOPY WITH ENDOBRONCHIAL NAVIGATION;  Surgeon: Melrose Nakayama, MD;  Location: [place];  Service: Thoracic;  Laterality: Left;     FAMILY HISTORY: History reviewed. No pertinent family history.   SOCIAL HISTORY:  reports that he quit smoking about 37 years ago. He has never used smokeless tobacco. He reports that he does not drink alcohol or use drugs.  The patient is married and accompanied by his wife, and his [facility] caregiver.  ALLERGIES: Iodine; Lyrica [pregabalin]; Shellfish-derived products; Penicillins; Procaine; Ciprofloxacin; Duloxetine; and Tape   MEDICATIONS:  Current Outpatient Medications  Medication Sig Dispense Refill  .  acetaminophen (TYLENOL) 500 MG tablet Take 1,000 mg by mouth every 8 (eight) hours as needed for mild pain.     Marland Kitchen acetaZOLAMIDE (DIAMOX) 250 MG tablet Take 250 mg by mouth 3 (three) times daily.    Marland Kitchen atorvastatin (LIPITOR) 10 MG tablet Take 10 mg by mouth daily.    Marland Kitchen atropine 1 % ophthalmic solution Place 1 drop into the right eye daily.    . bimatoprost (LUMIGAN) 0.01 % SOLN Place 1 drop into the right eye at bedtime.     Marland Kitchen buPROPion (WELLBUTRIN XL) 150 MG 24 hr tablet Take 150 mg by mouth every morning.     . calcium citrate (CALCITRATE - DOSED  IN MG ELEMENTAL CALCIUM) 950 MG tablet Take 400 mg of elemental calcium by mouth 3 (three) times daily.     . digoxin (LANOXIN) 0.125 MG tablet Take 0.0625 mg by mouth every morning.     . docusate sodium (COLACE) 250 MG capsule Take 250 mg by mouth daily.    . dorzolamidel-timolol (COSOPT) 22.3-6.8 MG/ML SOLN ophthalmic solution Place 1 drop into the right eye 2 (two) times daily. For glaucoma    . escitalopram (LEXAPRO) 20 MG tablet Take 20 mg by mouth every morning.    . finasteride (PROSCAR) 5 MG tablet Take 5 mg by mouth every morning.     . furosemide (LASIX) 40 MG tablet Take 40 mg by mouth daily.    Marland Kitchen gabapentin (NEURONTIN) 300 MG capsule Take 300 mg by mouth 2 (two) times daily.     Marland Kitchen levothyroxine (SYNTHROID, LEVOTHROID) 75 MCG tablet Take 37.5 mcg by mouth daily before breakfast.    . magnesium oxide (MAG-OX) 400 MG tablet Take 400 mg by mouth every morning.     . Nutritional Supplements (PROMOD) LIQD Take 30 mLs by mouth 3 (three) times daily with meals.    . pilocarpine (PILOCAR) 1 % ophthalmic solution Place 1 drop into the right eye 3 (three) times daily.    . polyethylene glycol (MIRALAX / GLYCOLAX) packet Take 17 g by mouth daily as needed for moderate constipation.    . potassium chloride SA (K-DUR,KLOR-CON) 20 MEQ tablet Take 20 mEq by mouth daily.    . ranitidine (ZANTAC) 150 MG tablet Take 150 mg by mouth at bedtime.    .  Rivaroxaban (XARELTO) 15 MG TABS tablet Take 15 mg by mouth every evening.     . Sunscreens (AVEENO DAILY MOISTURIZER) LOTN Apply 1 application topically daily. To lower extremities.     . tamsulosin (FLOMAX) 0.4 MG CAPS capsule Take 0.4 mg by mouth daily. FOR URINARY SYMPTOMS    . Vitamins A & D (VITAMIN A & D) ointment Apply 1 application topically as needed (rash).     No current facility-administered medications for this encounter.      REVIEW OF SYSTEMS: On review of systems, the patient reports that he is doing well overall. He had one episode of hemoptysis following his bronchoscopy. He continues to have pain in his left lower extremity. He continues as well to have medical and other social care through [facility]. He denies any chest pain, shortness of breath, cough, fevers, chills, night sweats, unintended weight changes.  He  denies any bowel or bladder disturbances, and denies abdominal pain, nausea or vomiting. He denies any new musculoskeletal or joint aches or pains. A complete review of systems is obtained and is otherwise negative.     PHYSICAL EXAM:  Wt Readings from Last 3 Encounters:  10/22/18 157 lb (71.2 kg)  10/13/18 166 lb (75.3 kg)  07/28/18 171 lb (77.6 kg)   Temp Readings from Last 3 Encounters:  10/22/18 97.7 F (36.5 C) (Oral)  10/13/18 (!) 97.5 F (36.4 C)  10/02/18 97.7 F (36.5 C)   BP Readings from Last 3 Encounters:  10/22/18 (!) 151/94  10/13/18 133/69  10/02/18 133/75   Pulse Readings from Last 3 Encounters:  10/22/18 68  10/13/18 70  10/02/18 70    In general this is a chronically ill appearing Caucasian male in no acute distress.  He is alert and oriented x4 and appropriate throughout the examination. Cardiopulmonary assessment is negative for acute distress  and he exhibits normal effort.  He is wheelchair-bound, and has left hemiparesis.   ECOG = 4 0 - Asymptomatic (Fully active, able to carry on all predisease activities without  restriction)  1 - Symptomatic but completely ambulatory (Restricted in physically strenuous activity but ambulatory and able to carry out work of a light or sedentary nature. For example, light housework, office work)  2 - Symptomatic, <50% in bed during the day (Ambulatory and capable of all self care but unable to carry out any work activities. Up and about more than 50% of waking hours)  3 - Symptomatic, >50% in bed, but not bedbound (Capable of only limited self-care, confined to bed or chair 50% or more of waking hours)  4 - Bedbound (Completely disabled. Cannot carry on any self-care. Totally confined to bed or chair)  5 - Death   Eustace Pen MM, Creech RH, Tormey DC, et al. [phone]). "Toxicity and response criteria of the [facility]". [place] Oncol. 5 (6): 649-55    LABORATORY DATA:  Lab Results  Component Value Date   WBC 7.2 10/13/2018   HGB 11.7 (L) 10/13/2018   HCT 40.2 10/13/2018   MCV 92.6 10/13/2018   PLT 240 10/13/2018   Lab Results  Component Value Date   NA 144 10/13/2018   K 4.1 10/13/2018   CL 114 (H) 10/13/2018   CO2 22 10/13/2018   Lab Results  Component Value Date   ALT 18 10/13/2018   AST 22 10/13/2018   ALKPHOS 97 10/13/2018   BILITOT 0.6 10/13/2018      RADIOGRAPHY: Dg Chest 2 View  Result Date: 10/13/2018 CLINICAL DATA:  Left upper lobe nodule. Patient states scheduled for video bronchoscopic intervention of left upper lobe nodule. EXAM: CHEST - 2 VIEW COMPARISON:  Chest x-ray 07/25/2018 and PET-CT 09/15/2018 FINDINGS: Left-sided pacemaker unchanged. Lungs are adequately inflated without consolidation or effusion. Stable known left upper lobe nodular opacity previously evaluated by CT and PET-CT. Cardiomediastinal silhouette and remainder of the exam is unchanged. IMPRESSION: No acute cardiopulmonary disease. Stable nodular opacity over the left upper lobe. Electronically Signed   By: Marin Olp M.D.   On: 10/13/2018 10:42    Dg C-arm Bronchoscopy  Result Date: 10/13/2018 C-ARM BRONCHOSCOPY: Fluoroscopy was utilized by the requesting physician.  No radiographic interpretation.       IMPRESSION/PLAN: 1. Stage IA3, cT1cN0M0 NSCLC, adenocarcinoma of the left upper lobe.  Dr. Lisbeth Renshaw discusses the findings from his imaging including his CT and PET scan as well as the recent biopsy results of his LUL cancer. He discusses the alternative to surgery as he is not felt to be a surgical candidate would include stereotactic body radiotherapy (SBRT).  We did review the risks, benefits, short and long-term effects of radiotherapy as well as the delivery and logistics.  We discussed these would likely be treatments given over 3-5 fractions. Written consent is obtained and placed in the chart, a copy was provided to the patient. He will simulate this afternoon. 2.   In situ pacemaker.  We will communicate with Dr. Evon Slack regarding safety to proceed with treatment given his pacemaker.   In a visit lasting 25 minutes, greater than 50% of the time was spent face to face discussing his case, and coordinating the patient's care.   The above documentation reflects my direct findings during this shared patient visit. Please see the separate note by Dr. Lisbeth Renshaw on this date for the remainder of the patient's plan  of care.    Carola Rhine, PAC

## 2018-10-23 ENCOUNTER — Other Ambulatory Visit: Payer: Self-pay | Admitting: *Deleted

## 2018-10-23 ENCOUNTER — Encounter: Payer: Self-pay | Admitting: *Deleted

## 2018-10-23 LAB — AEROBIC CULTURE W GRAM STAIN (SUPERFICIAL SPECIMEN)

## 2018-10-23 LAB — AEROBIC CULTURE  (SUPERFICIAL SPECIMEN)

## 2018-10-23 NOTE — Addendum Note (Signed)
Encounter addended by: Malena Edman, RN on: 10/23/2018 9:23 AM  Actions taken: Sign clinical note

## 2018-10-23 NOTE — Progress Notes (Signed)
Treatment plan discussed at cancer conference is SBRT.  Patient had SIM on 10/22/18

## 2018-10-28 DIAGNOSIS — T8131XA Disruption of external operation (surgical) wound, not elsewhere classified, initial encounter: Secondary | ICD-10-CM | POA: Diagnosis not present

## 2018-10-28 DIAGNOSIS — Z510 Encounter for antineoplastic radiation therapy: Secondary | ICD-10-CM | POA: Diagnosis not present

## 2018-11-03 ENCOUNTER — Ambulatory Visit
Admission: RE | Admit: 2018-11-03 | Discharge: 2018-11-03 | Disposition: A | Discharge status: Home / Self Care | Payer: Medicare (Managed Care) | Source: Ambulatory Visit | Attending: Radiation Oncology | Admitting: Radiation Oncology

## 2018-11-03 DIAGNOSIS — Z510 Encounter for antineoplastic radiation therapy: Secondary | ICD-10-CM | POA: Diagnosis not present

## 2018-11-05 ENCOUNTER — Ambulatory Visit: Payer: Medicare (Managed Care) | Admitting: Radiation Oncology

## 2018-11-05 IMAGING — CT CT CHEST W/O CM
2 of 4 series · 15 of 36 positions shown, 18 images · non-contrast
Comparison: Chest x-ray of 07/25/2018, and CT chest of 02/21/2017

CLINICAL DATA: Spiculated lesion in the left upper lung, evaluate
interval change

EXAM:
CT CHEST WITHOUT CONTRAST
TECHNIQUE: Multidetector CT imaging of the chest was performed following the
standard protocol without IV contrast.

[Series 5: chest w/o 1mm st · axial · non-contrast · 0.84mm/px · z∈[+884,+1197]mm · 12 of 439 slices shown, 15 images]
[im 24/439  mediastinal]
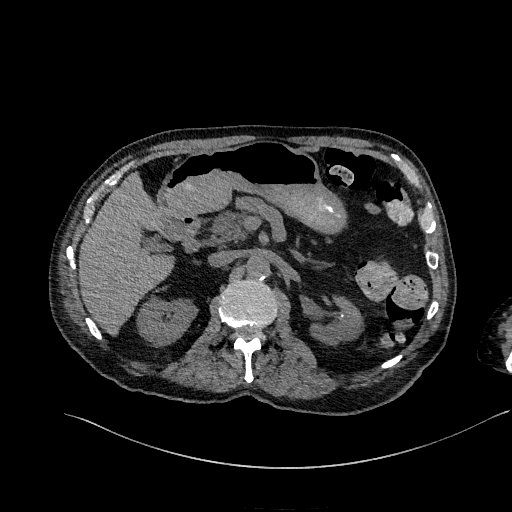
[im 24/439  lung]
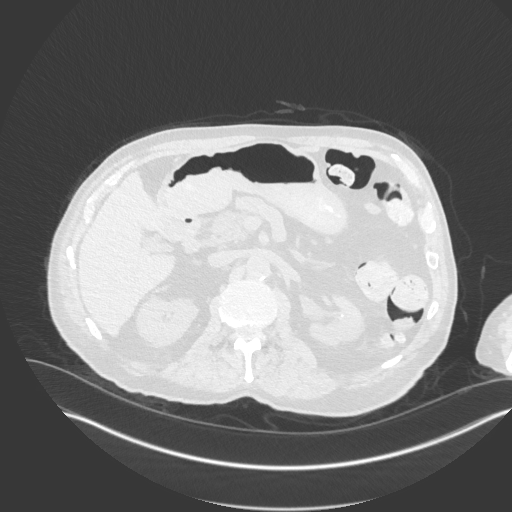
[im 70/439  lung]
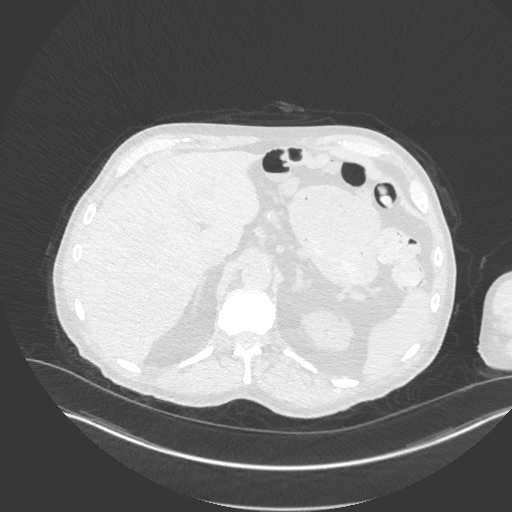
[im 93/439  lung]
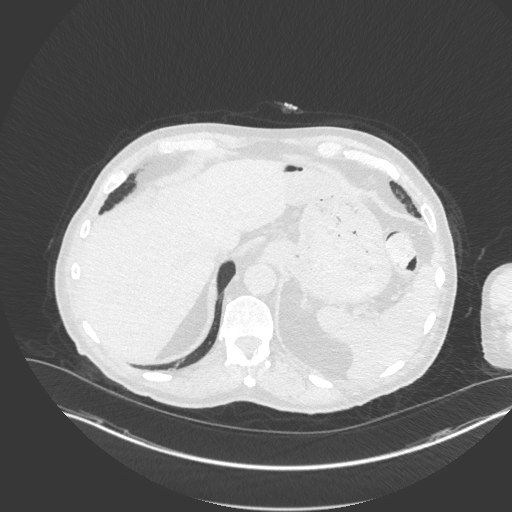
[im 139/439  lung]
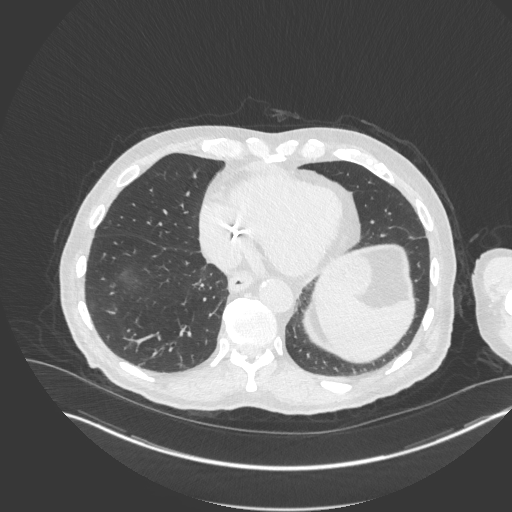
[im 162/439  mediastinal]
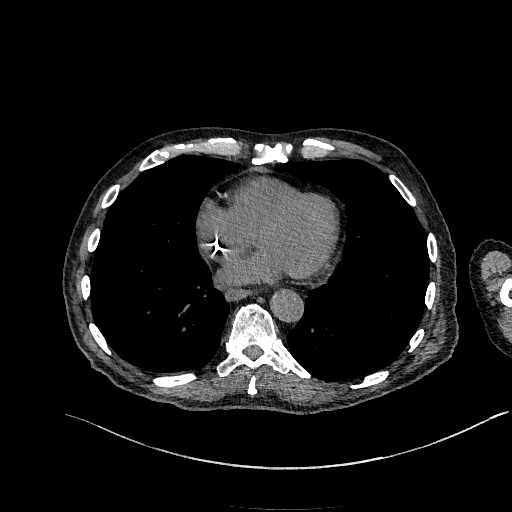
[im 162/439  lung]
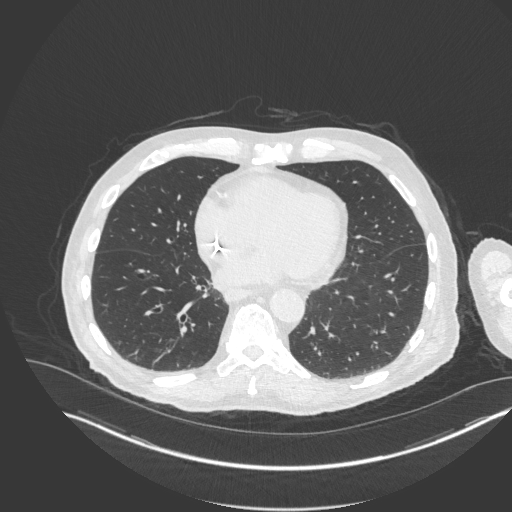
[im 208/439  lung]
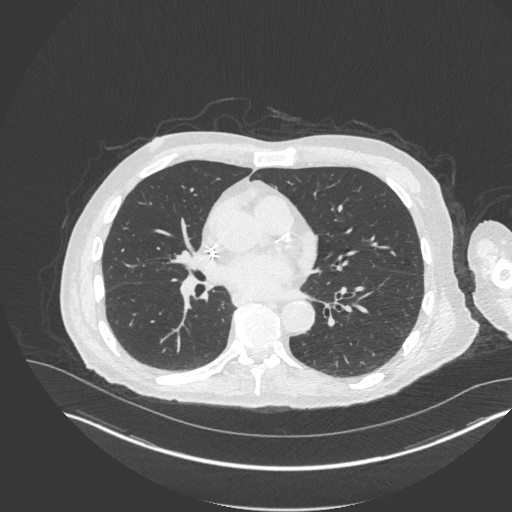
[im 231/439  lung]
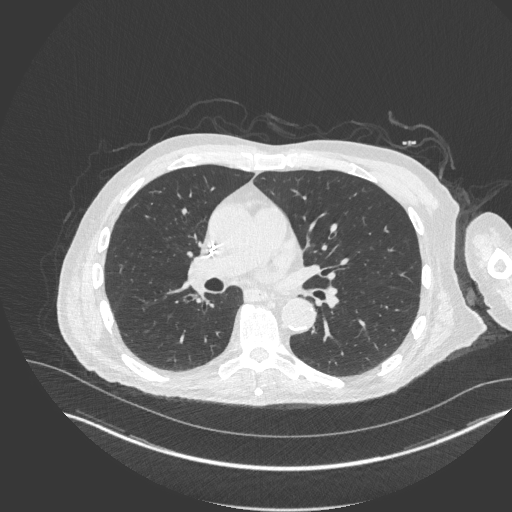
[im 277/439  lung]
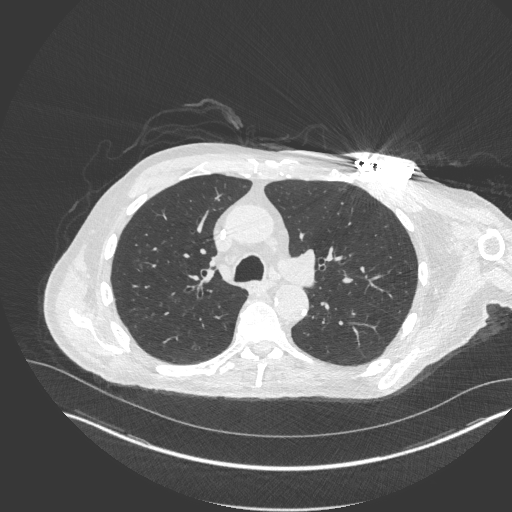
[im 300/439  mediastinal]
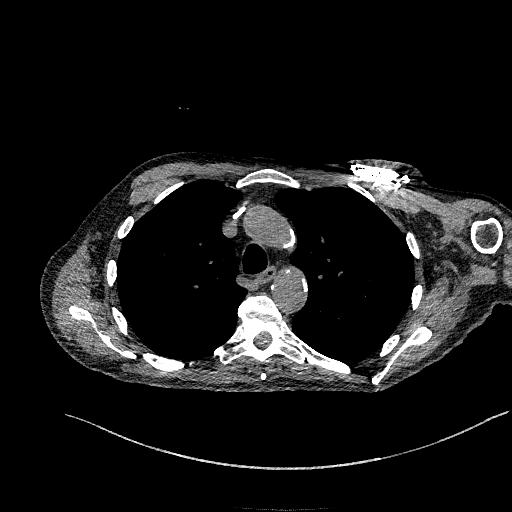
[im 300/439  lung]
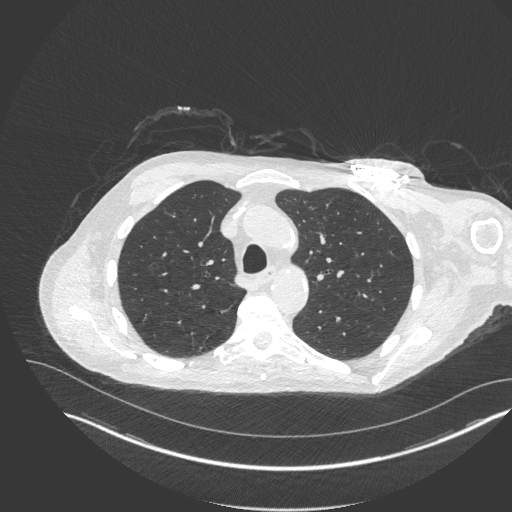
[im 346/439  lung]
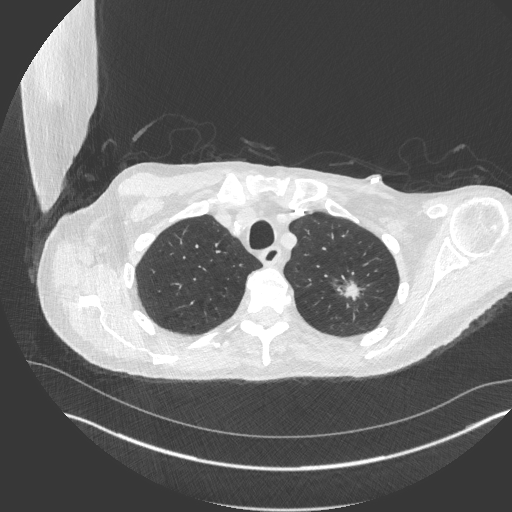
[im 369/439  lung]
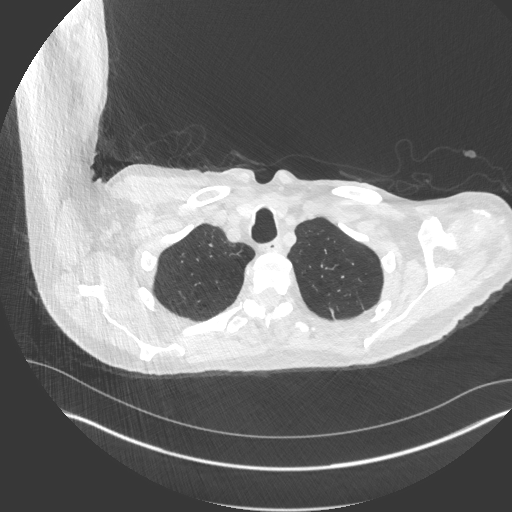
[im 415/439  lung]
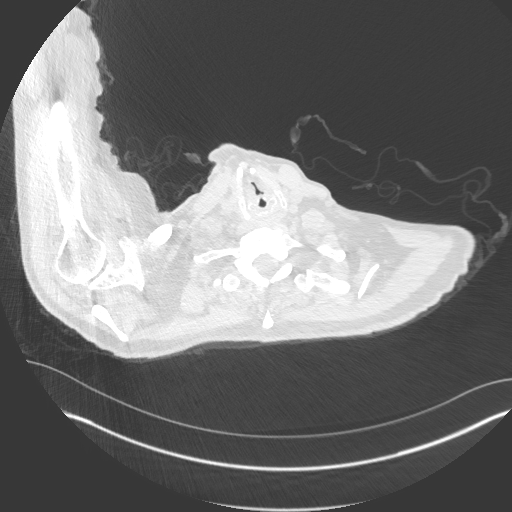

[Series 6: chest w/o 3mm st cor · coronal · non-contrast · 0.69mm/px · 3 of 92 slices shown]
[im 19/92  lung]
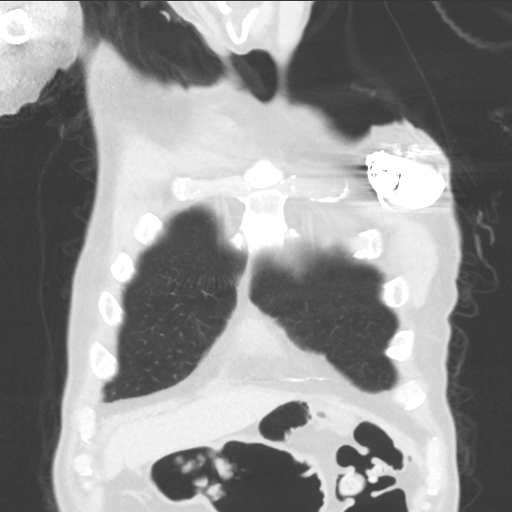
[im 37/92  lung]
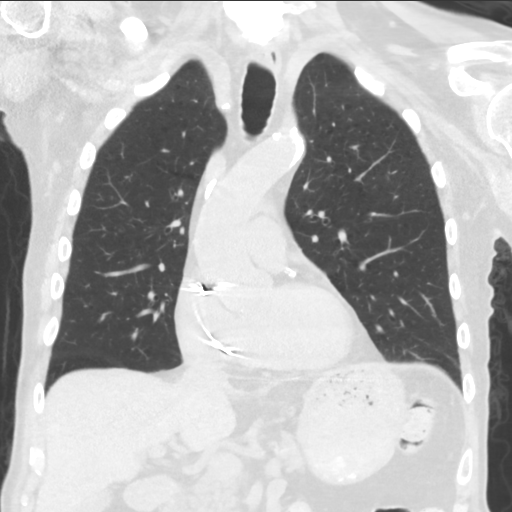
[im 55/92  lung]
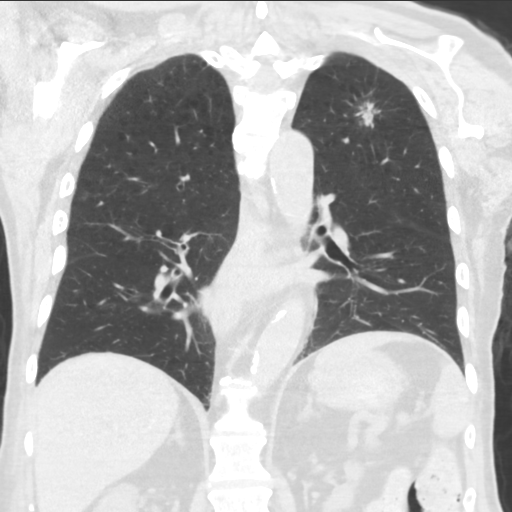

[15 of 36 positions shown; findings below may reference images not displayed]

FINDINGS: Cardiovascular: Significant thoracic aortic atherosclerosis is
noted. There are diffuse coronary artery calcifications and mild
cardiomegaly is stable. No pericardial effusion is seen scratch only
a tiny pericardial effusion is noted. Pacer wires again are noted.
The mid ascending thoracic aorta measures 36 mm in diameter.

Mediastinum/Nodes: No mediastinal or hilar adenopathy is seen on
this unenhanced study. The thyroid gland is unremarkable. No
abnormality of the thoracic aorta is evident.

Lungs/Pleura: The ground-glass opacity described previously within
the left upper lobe has now enlarged with a much more prominent soft
tissue component measuring 14 mm on image 40 series 4 with the
ground-glass component measuring 28 mm. There has been significant
interval growth in this is worrisome for low-grade adenocarcinoma.
PET-CT is recommended to evaluate metabolic activity. No additional
lung nodule is seen and no mass is evident. There is no evidence of
pleural effusion. The central airway is patent. Mild emphysematous
changes are present in the upper lobes.

Upper Abdomen: The liver is unremarkable in the unenhanced state.
Small renal calculi are noted bilaterally which are nonobstructing.

Musculoskeletal: There are degenerative changes diffusely throughout
the thoracic spine.
IMPRESSION: 1. Increase in soft tissue component and overall size of the
previously described ground-glass opacity in the left upper lobe
posteriorly worrisome for low-grade adenocarcinoma. Recommend PET-CT
to assess for metabolic activity.
2. No mediastinal or hilar adenopathy is seen.
3. Significant thoracic aortic atherosclerosis and diffuse coronary
artery calcifications.
4. Small renal calculi bilaterally which are nonobstructing.

## 2018-11-10 ENCOUNTER — Ambulatory Visit
Admission: RE | Admit: 2018-11-10 | Discharge: 2018-11-10 | Disposition: A | Discharge status: Home / Self Care | Payer: Medicare (Managed Care) | Source: Ambulatory Visit | Attending: Radiation Oncology | Admitting: Radiation Oncology

## 2018-11-10 DIAGNOSIS — Z510 Encounter for antineoplastic radiation therapy: Secondary | ICD-10-CM | POA: Insufficient documentation

## 2018-11-10 DIAGNOSIS — C3412 Malignant neoplasm of upper lobe, left bronchus or lung: Secondary | ICD-10-CM | POA: Diagnosis present

## 2018-11-11 ENCOUNTER — Encounter (HOSPITAL_BASED_OUTPATIENT_CLINIC_OR_DEPARTMENT_OTHER): Payer: Medicare (Managed Care) | Attending: Internal Medicine

## 2018-11-11 DIAGNOSIS — N186 End stage renal disease: Secondary | ICD-10-CM | POA: Diagnosis not present

## 2018-11-11 DIAGNOSIS — G4730 Sleep apnea, unspecified: Secondary | ICD-10-CM | POA: Insufficient documentation

## 2018-11-11 DIAGNOSIS — I120 Hypertensive chronic kidney disease with stage 5 chronic kidney disease or end stage renal disease: Secondary | ICD-10-CM | POA: Diagnosis not present

## 2018-11-11 DIAGNOSIS — T8131XA Disruption of external operation (surgical) wound, not elsewhere classified, initial encounter: Secondary | ICD-10-CM | POA: Insufficient documentation

## 2018-11-11 DIAGNOSIS — Y838 Other surgical procedures as the cause of abnormal reaction of the patient, or of later complication, without mention of misadventure at the time of the procedure: Secondary | ICD-10-CM | POA: Insufficient documentation

## 2018-11-11 DIAGNOSIS — I2510 Atherosclerotic heart disease of native coronary artery without angina pectoris: Secondary | ICD-10-CM | POA: Diagnosis not present

## 2018-11-11 DIAGNOSIS — E1122 Type 2 diabetes mellitus with diabetic chronic kidney disease: Secondary | ICD-10-CM | POA: Diagnosis not present

## 2018-11-12 ENCOUNTER — Ambulatory Visit: Payer: Medicare (Managed Care) | Admitting: Radiation Oncology

## 2018-11-12 ENCOUNTER — Ambulatory Visit
Admission: RE | Admit: 2018-11-12 | Discharge: 2018-11-12 | Disposition: A | Discharge status: Home / Self Care | Payer: Medicare (Managed Care) | Source: Ambulatory Visit | Attending: Radiation Oncology | Admitting: Radiation Oncology

## 2018-11-12 ENCOUNTER — Encounter: Payer: Self-pay | Admitting: Urology

## 2018-11-12 DIAGNOSIS — C3412 Malignant neoplasm of upper lobe, left bronchus or lung: Secondary | ICD-10-CM

## 2018-11-12 DIAGNOSIS — Z510 Encounter for antineoplastic radiation therapy: Secondary | ICD-10-CM | POA: Diagnosis not present

## 2018-11-12 NOTE — Progress Notes (Signed)
  Radiation Oncology         [phone] ________________________________  Name: Steven Duffy MRN: [id number]  Date: 11/12/2018  DOB: 20-Jul-1941  End of Treatment Note  Diagnosis:   Stage IA, cT1cN0M0 NSCLC, adenocarcinoma of the left upper lobe.     Indication for treatment:  Curative, Definitive SBRT       Radiation treatment dates:   11/03/18, 11/10/18, 11/12/18  Site/dose:   The target was treated to 54 Gy in 3 fractions of 18 Gy  Beams/energy:   The patient was treated using stereotactic body radiotherapy according to a 3D conformal radiotherapy plan.  Volumetric arc fields were employed to deliver 6 MV X-rays.  Image guidance was performed with per fraction cone beam CT prior to treatment under personal MD supervision.  Immobilization was achieved using BodyFix Pillow.  Narrative: The patient tolerated radiation treatment relatively well.   He denied chest pain, increased shortness of breath, productive cough, hemoptysis, fever, chills or night sweats. He experienced mild fatigue but otherwise feels well.  Plan: The patient has completed radiation treatment. The patient will return to radiation oncology clinic for routine followup in one month. I advised them to call or return sooner if they have any questions or concerns related to their recovery or treatment.  Nicholos Johns, MMS, PA-C [place] [facility] [place]: [phone]  Fax: [phone] -----------------------------------------------  Jodelle Gross, MD, PhD

## 2018-11-14 ENCOUNTER — Ambulatory Visit: Payer: Medicare (Managed Care) | Admitting: Radiation Oncology

## 2018-11-25 DIAGNOSIS — T8131XA Disruption of external operation (surgical) wound, not elsewhere classified, initial encounter: Secondary | ICD-10-CM | POA: Diagnosis not present

## 2018-12-01 NOTE — Progress Notes (Signed)
  Radiation Oncology         [phone] ________________________________  Name: Steven Duffy MRN: [id number]  Date: 10/22/2018  DOB: 08-14-1941  RESPIRATORY MOTION MANAGEMENT SIMULATION  NARRATIVE:  In order to account for effect of respiratory motion on target structures and other organs in the planning and delivery of radiotherapy, this patient underwent respiratory motion management simulation.  To accomplish this, when the patient was brought to the CT simulation planning suite, 4D respiratoy motion management CT images were obtained.  The CT images were loaded into the planning software.  Then, using a variety of tools including Cine, MIP, and standard views, the target volume and planning target volumes (PTV) were delineated.  Avoidance structures were contoured.  Treatment planning then occurred.  Dose volume histograms were generated and reviewed for each of the requested structure.  The resulting plan was carefully reviewed and approved today.   ------------------------------------------------  Jodelle Gross, MD, PhD

## 2018-12-01 NOTE — Progress Notes (Signed)
[facility] Radiation Oncology Simulation and Treatment Planning Note   Name:  Steven Duffy MRN: [id number]   Date: 12/01/2018  DOB: 12-07-1941  Status:outpatient    DIAGNOSIS:    ICD-10-CM   1. Malignant neoplasm of upper lobe of left lung ([place]) C34.12      CONSENT VERIFIED:yes   SET UP: Patient is setup supine   IMMOBILIZATION: The patient was immobilized using a customized Vac Loc bag/ blue bag and customized accuform device   NARRATIVE:The patient was brought to the [facility].  Identity was confirmed.  All relevant records and images related to the planned course of therapy were reviewed.  Then, the patient was positioned in a stable reproducible clinical set-up for radiation therapy. Abdominal compression was applied by me.  4D CT images were obtained and reproducible breathing pattern was confirmed. Free breathing CT images were obtained.  Skin markings were placed.  The CT images were loaded into the planning software where the target and avoidance structures were contoured.  The radiation prescription was entered and confirmed.    TREATMENT PLANNING NOTE:  Treatment planning then occurred. I have requested : IMRT planning.This treatment technique is medically necessary due to the high-dose of radiation delivered to the target region which is in close proximity to adjacent critical normal structures.  3 dimensional simulation is performed and dose volume histogram of the gross tumor volume, planning tumor volume and criticial normal structures including the spinal cord and lungs were analyzed and requested.  Special treatment procedure was performed due to high dose per fraction.  The patient will be monitored for increased risk of toxicity.  Daily imaging using cone beam CT will be used for target localization.  I anticipate that the patient will receive 54 Gy in 3 fractions to target volume. Further adjustments will be made based on  the planning process is necessary.  ------------------------------------------------  Jodelle Gross, MD, PhD

## 2018-12-15 ENCOUNTER — Ambulatory Visit: Payer: Medicare (Managed Care) | Admitting: Radiation Oncology

## 2018-12-15 ENCOUNTER — Other Ambulatory Visit: Payer: Self-pay | Admitting: Radiation Oncology

## 2018-12-15 DIAGNOSIS — C3412 Malignant neoplasm of upper lobe, left bronchus or lung: Secondary | ICD-10-CM

## 2018-12-17 ENCOUNTER — Encounter (HOSPITAL_BASED_OUTPATIENT_CLINIC_OR_DEPARTMENT_OTHER): Payer: Medicare (Managed Care) | Attending: Physician Assistant

## 2018-12-17 DIAGNOSIS — Z87891 Personal history of nicotine dependence: Secondary | ICD-10-CM | POA: Diagnosis not present

## 2018-12-17 DIAGNOSIS — E119 Type 2 diabetes mellitus without complications: Secondary | ICD-10-CM | POA: Diagnosis not present

## 2018-12-17 DIAGNOSIS — T8131XA Disruption of external operation (surgical) wound, not elsewhere classified, initial encounter: Secondary | ICD-10-CM | POA: Diagnosis present

## 2018-12-17 DIAGNOSIS — Y838 Other surgical procedures as the cause of abnormal reaction of the patient, or of later complication, without mention of misadventure at the time of the procedure: Secondary | ICD-10-CM | POA: Insufficient documentation

## 2018-12-23 ENCOUNTER — Other Ambulatory Visit (HOSPITAL_COMMUNITY): Payer: Self-pay | Admitting: Nurse Practitioner

## 2018-12-24 ENCOUNTER — Telehealth: Payer: Self-pay | Admitting: *Deleted

## 2018-12-24 NOTE — Telephone Encounter (Signed)
CALLED [facility] FOR LAB AND TEST FOR THIS PATIENT, LVM FOR A RETURN CALL (MESSAGE LEFT FOR SONYA)

## 2018-12-26 ENCOUNTER — Ambulatory Visit (HOSPITAL_COMMUNITY)
Admission: RE | Admit: 2018-12-26 | Discharge: 2018-12-26 | Disposition: A | Discharge status: Home / Self Care | Payer: Medicare (Managed Care) | Source: Ambulatory Visit | Attending: Radiation Oncology | Admitting: Radiation Oncology

## 2018-12-26 ENCOUNTER — Encounter (HOSPITAL_COMMUNITY): Payer: Self-pay | Admitting: Radiology

## 2018-12-26 ENCOUNTER — Ambulatory Visit
Admission: RE | Admit: 2018-12-26 | Discharge: 2018-12-26 | Disposition: A | Discharge status: Home / Self Care | Payer: Medicare (Managed Care) | Source: Ambulatory Visit | Attending: Radiation Oncology | Admitting: Radiation Oncology

## 2018-12-26 DIAGNOSIS — Z510 Encounter for antineoplastic radiation therapy: Secondary | ICD-10-CM | POA: Diagnosis present

## 2018-12-26 DIAGNOSIS — C3412 Malignant neoplasm of upper lobe, left bronchus or lung: Secondary | ICD-10-CM | POA: Diagnosis present

## 2018-12-26 LAB — BUN & CREATININE (CHCC)
BUN: 22 mg/dL (ref 8–23)
CREATININE: 1.38 mg/dL — AB (ref 0.61–1.24)
GFR, EST AFRICAN AMERICAN: 57 mL/min — AB (ref >60)
GFR, EST NON AFRICAN AMERICAN: 49 mL/min — AB (ref >60)

## 2018-12-26 MED ORDER — IOHEXOL 300 MG/ML  SOLN
100.0000 mL | Freq: Once | INTRAMUSCULAR | Status: AC | PRN
  Administered 2018-12-26: 100 mL via INTRAVENOUS

## 2018-12-26 MED ORDER — SODIUM CHLORIDE (PF) 0.9 % IJ SOLN
INTRAMUSCULAR | Status: AC
  Filled 2018-12-26: qty 50

## 2018-12-29 ENCOUNTER — Telehealth: Payer: Self-pay | Admitting: Radiation Oncology

## 2018-12-29 DIAGNOSIS — C3490 Malignant neoplasm of unspecified part of unspecified bronchus or lung: Secondary | ICD-10-CM

## 2018-12-29 NOTE — Telephone Encounter (Signed)
I called the patient and let him know his CT results. He and his wife were able to be on the call. His scan shows improvement in the treated site in the left upper lobe of the lung. We anticipate this to be improvement of the cancer, and on subsequent CTs we expect to see scar in the place of the site where treatment was given, and I explained that this is typical for this style of radiotherapy to still be able to see the formerly treated site. We will plan his next scan in 6 months per NCCN guidelines and see him back at that time to review the results. He is in agreement. I've also left a message for Ivin Booty at [facility] as his appointments are all coordinated through their office.

## 2018-12-31 DIAGNOSIS — T8131XA Disruption of external operation (surgical) wound, not elsewhere classified, initial encounter: Secondary | ICD-10-CM | POA: Diagnosis not present

## 2019-01-14 ENCOUNTER — Encounter (HOSPITAL_BASED_OUTPATIENT_CLINIC_OR_DEPARTMENT_OTHER): Payer: Medicare (Managed Care) | Attending: Internal Medicine

## 2019-01-14 ENCOUNTER — Encounter (HOSPITAL_BASED_OUTPATIENT_CLINIC_OR_DEPARTMENT_OTHER): Payer: Self-pay

## 2019-01-14 DIAGNOSIS — G4730 Sleep apnea, unspecified: Secondary | ICD-10-CM | POA: Diagnosis not present

## 2019-01-14 DIAGNOSIS — I120 Hypertensive chronic kidney disease with stage 5 chronic kidney disease or end stage renal disease: Secondary | ICD-10-CM | POA: Diagnosis not present

## 2019-01-14 DIAGNOSIS — L97422 Non-pressure chronic ulcer of left heel and midfoot with fat layer exposed: Secondary | ICD-10-CM | POA: Diagnosis not present

## 2019-01-14 DIAGNOSIS — N186 End stage renal disease: Secondary | ICD-10-CM | POA: Diagnosis not present

## 2019-01-14 DIAGNOSIS — Z87891 Personal history of nicotine dependence: Secondary | ICD-10-CM | POA: Insufficient documentation

## 2019-01-14 DIAGNOSIS — Z9989 Dependence on other enabling machines and devices: Secondary | ICD-10-CM | POA: Diagnosis not present

## 2019-01-14 DIAGNOSIS — E1122 Type 2 diabetes mellitus with diabetic chronic kidney disease: Secondary | ICD-10-CM | POA: Diagnosis not present

## 2019-01-14 DIAGNOSIS — E11621 Type 2 diabetes mellitus with foot ulcer: Secondary | ICD-10-CM | POA: Insufficient documentation

## 2019-01-14 DIAGNOSIS — I2510 Atherosclerotic heart disease of native coronary artery without angina pectoris: Secondary | ICD-10-CM | POA: Insufficient documentation

## 2019-01-28 DIAGNOSIS — E11621 Type 2 diabetes mellitus with foot ulcer: Secondary | ICD-10-CM | POA: Diagnosis not present

## 2019-02-11 ENCOUNTER — Encounter (HOSPITAL_BASED_OUTPATIENT_CLINIC_OR_DEPARTMENT_OTHER): Payer: Medicare (Managed Care) | Attending: Internal Medicine

## 2019-02-11 DIAGNOSIS — Y838 Other surgical procedures as the cause of abnormal reaction of the patient, or of later complication, without mention of misadventure at the time of the procedure: Secondary | ICD-10-CM | POA: Diagnosis not present

## 2019-02-11 DIAGNOSIS — L97526 Non-pressure chronic ulcer of other part of left foot with bone involvement without evidence of necrosis: Secondary | ICD-10-CM | POA: Insufficient documentation

## 2019-02-11 DIAGNOSIS — L89620 Pressure ulcer of left heel, unstageable: Secondary | ICD-10-CM | POA: Diagnosis present

## 2019-02-11 DIAGNOSIS — T8130XA Disruption of wound, unspecified, initial encounter: Secondary | ICD-10-CM | POA: Diagnosis not present

## 2019-02-11 DIAGNOSIS — E11621 Type 2 diabetes mellitus with foot ulcer: Secondary | ICD-10-CM | POA: Diagnosis not present

## 2019-02-13 ENCOUNTER — Other Ambulatory Visit: Payer: Self-pay | Admitting: Family Medicine

## 2019-02-13 DIAGNOSIS — M869 Osteomyelitis, unspecified: Secondary | ICD-10-CM

## 2019-02-19 ENCOUNTER — Ambulatory Visit (HOSPITAL_COMMUNITY)
Admission: RE | Admit: 2019-02-19 | Discharge: 2019-02-19 | Disposition: A | Discharge status: Home / Self Care | Payer: Medicare (Managed Care) | Source: Ambulatory Visit | Attending: Family Medicine | Admitting: Family Medicine

## 2019-02-19 ENCOUNTER — Other Ambulatory Visit: Payer: Self-pay

## 2019-02-19 DIAGNOSIS — M869 Osteomyelitis, unspecified: Secondary | ICD-10-CM | POA: Diagnosis present

## 2019-03-04 ENCOUNTER — Other Ambulatory Visit (HOSPITAL_COMMUNITY)
Admission: RE | Admit: 2019-03-04 | Discharge: 2019-03-04 | Disposition: A | Discharge status: Home / Self Care | Payer: Medicare (Managed Care) | Source: Other Acute Inpatient Hospital | Attending: Internal Medicine | Admitting: Internal Medicine

## 2019-03-04 DIAGNOSIS — L89620 Pressure ulcer of left heel, unstageable: Secondary | ICD-10-CM | POA: Diagnosis not present

## 2019-03-04 DIAGNOSIS — T8131XD Disruption of external operation (surgical) wound, not elsewhere classified, subsequent encounter: Secondary | ICD-10-CM | POA: Diagnosis present

## 2019-03-07 LAB — AEROBIC CULTURE W GRAM STAIN (SUPERFICIAL SPECIMEN)

## 2019-03-07 LAB — AEROBIC CULTURE  (SUPERFICIAL SPECIMEN)

## 2019-03-25 ENCOUNTER — Encounter (HOSPITAL_BASED_OUTPATIENT_CLINIC_OR_DEPARTMENT_OTHER): Payer: Medicare (Managed Care) | Attending: Physician Assistant

## 2019-03-25 DIAGNOSIS — N186 End stage renal disease: Secondary | ICD-10-CM | POA: Insufficient documentation

## 2019-03-25 DIAGNOSIS — G4730 Sleep apnea, unspecified: Secondary | ICD-10-CM | POA: Insufficient documentation

## 2019-03-25 DIAGNOSIS — T8131XA Disruption of external operation (surgical) wound, not elsewhere classified, initial encounter: Secondary | ICD-10-CM | POA: Insufficient documentation

## 2019-03-25 DIAGNOSIS — I120 Hypertensive chronic kidney disease with stage 5 chronic kidney disease or end stage renal disease: Secondary | ICD-10-CM | POA: Insufficient documentation

## 2019-03-25 DIAGNOSIS — L97522 Non-pressure chronic ulcer of other part of left foot with fat layer exposed: Secondary | ICD-10-CM | POA: Insufficient documentation

## 2019-03-25 DIAGNOSIS — I2510 Atherosclerotic heart disease of native coronary artery without angina pectoris: Secondary | ICD-10-CM | POA: Insufficient documentation

## 2019-03-25 DIAGNOSIS — L89620 Pressure ulcer of left heel, unstageable: Secondary | ICD-10-CM | POA: Insufficient documentation

## 2019-03-25 DIAGNOSIS — E11621 Type 2 diabetes mellitus with foot ulcer: Secondary | ICD-10-CM | POA: Insufficient documentation

## 2019-03-25 DIAGNOSIS — Y838 Other surgical procedures as the cause of abnormal reaction of the patient, or of later complication, without mention of misadventure at the time of the procedure: Secondary | ICD-10-CM | POA: Insufficient documentation

## 2019-03-25 DIAGNOSIS — E1122 Type 2 diabetes mellitus with diabetic chronic kidney disease: Secondary | ICD-10-CM | POA: Insufficient documentation

## 2019-04-01 DIAGNOSIS — Y838 Other surgical procedures as the cause of abnormal reaction of the patient, or of later complication, without mention of misadventure at the time of the procedure: Secondary | ICD-10-CM | POA: Diagnosis not present

## 2019-04-01 DIAGNOSIS — I120 Hypertensive chronic kidney disease with stage 5 chronic kidney disease or end stage renal disease: Secondary | ICD-10-CM | POA: Diagnosis not present

## 2019-04-01 DIAGNOSIS — E1122 Type 2 diabetes mellitus with diabetic chronic kidney disease: Secondary | ICD-10-CM | POA: Diagnosis not present

## 2019-04-01 DIAGNOSIS — N186 End stage renal disease: Secondary | ICD-10-CM | POA: Diagnosis not present

## 2019-04-01 DIAGNOSIS — E11621 Type 2 diabetes mellitus with foot ulcer: Secondary | ICD-10-CM | POA: Diagnosis not present

## 2019-04-01 DIAGNOSIS — L97522 Non-pressure chronic ulcer of other part of left foot with fat layer exposed: Secondary | ICD-10-CM | POA: Diagnosis not present

## 2019-04-01 DIAGNOSIS — L89620 Pressure ulcer of left heel, unstageable: Secondary | ICD-10-CM | POA: Diagnosis not present

## 2019-04-01 DIAGNOSIS — T8131XA Disruption of external operation (surgical) wound, not elsewhere classified, initial encounter: Secondary | ICD-10-CM | POA: Diagnosis not present

## 2019-04-01 DIAGNOSIS — G4730 Sleep apnea, unspecified: Secondary | ICD-10-CM | POA: Diagnosis not present

## 2019-04-01 DIAGNOSIS — I2510 Atherosclerotic heart disease of native coronary artery without angina pectoris: Secondary | ICD-10-CM | POA: Diagnosis not present

## 2019-04-15 ENCOUNTER — Other Ambulatory Visit: Payer: Self-pay

## 2019-04-15 ENCOUNTER — Encounter (HOSPITAL_BASED_OUTPATIENT_CLINIC_OR_DEPARTMENT_OTHER): Payer: Medicare (Managed Care) | Attending: Physician Assistant

## 2019-04-15 DIAGNOSIS — E1122 Type 2 diabetes mellitus with diabetic chronic kidney disease: Secondary | ICD-10-CM | POA: Diagnosis not present

## 2019-04-15 DIAGNOSIS — L97422 Non-pressure chronic ulcer of left heel and midfoot with fat layer exposed: Secondary | ICD-10-CM | POA: Insufficient documentation

## 2019-04-15 DIAGNOSIS — N186 End stage renal disease: Secondary | ICD-10-CM | POA: Diagnosis not present

## 2019-04-15 DIAGNOSIS — Y838 Other surgical procedures as the cause of abnormal reaction of the patient, or of later complication, without mention of misadventure at the time of the procedure: Secondary | ICD-10-CM | POA: Diagnosis not present

## 2019-04-15 DIAGNOSIS — L89620 Pressure ulcer of left heel, unstageable: Secondary | ICD-10-CM | POA: Diagnosis present

## 2019-04-15 DIAGNOSIS — I120 Hypertensive chronic kidney disease with stage 5 chronic kidney disease or end stage renal disease: Secondary | ICD-10-CM | POA: Insufficient documentation

## 2019-04-15 DIAGNOSIS — I2510 Atherosclerotic heart disease of native coronary artery without angina pectoris: Secondary | ICD-10-CM | POA: Insufficient documentation

## 2019-04-15 DIAGNOSIS — T8131XA Disruption of external operation (surgical) wound, not elsewhere classified, initial encounter: Secondary | ICD-10-CM | POA: Diagnosis not present

## 2019-04-15 DIAGNOSIS — G4730 Sleep apnea, unspecified: Secondary | ICD-10-CM | POA: Diagnosis not present

## 2019-04-15 DIAGNOSIS — Z87891 Personal history of nicotine dependence: Secondary | ICD-10-CM | POA: Diagnosis not present

## 2019-04-15 DIAGNOSIS — I4891 Unspecified atrial fibrillation: Secondary | ICD-10-CM | POA: Insufficient documentation

## 2019-04-24 ENCOUNTER — Other Ambulatory Visit: Payer: Self-pay | Admitting: Family Medicine

## 2019-04-27 ENCOUNTER — Other Ambulatory Visit (HOSPITAL_COMMUNITY): Payer: Self-pay | Admitting: Family Medicine

## 2019-04-27 ENCOUNTER — Other Ambulatory Visit: Payer: Self-pay | Admitting: Family Medicine

## 2019-04-27 DIAGNOSIS — L0291 Cutaneous abscess, unspecified: Secondary | ICD-10-CM

## 2019-04-27 DIAGNOSIS — M86172 Other acute osteomyelitis, left ankle and foot: Secondary | ICD-10-CM

## 2019-04-30 ENCOUNTER — Other Ambulatory Visit: Payer: Self-pay

## 2019-04-30 ENCOUNTER — Ambulatory Visit (HOSPITAL_COMMUNITY)
Admission: RE | Admit: 2019-04-30 | Discharge: 2019-04-30 | Disposition: A | Discharge status: Home / Self Care | Payer: Medicare (Managed Care) | Source: Ambulatory Visit | Attending: Family Medicine | Admitting: Family Medicine

## 2019-04-30 DIAGNOSIS — M86172 Other acute osteomyelitis, left ankle and foot: Secondary | ICD-10-CM | POA: Insufficient documentation

## 2019-04-30 DIAGNOSIS — L0291 Cutaneous abscess, unspecified: Secondary | ICD-10-CM | POA: Diagnosis present

## 2019-05-27 ENCOUNTER — Other Ambulatory Visit: Payer: Self-pay | Admitting: Orthopedic Surgery

## 2019-05-29 ENCOUNTER — Other Ambulatory Visit (HOSPITAL_COMMUNITY)
Admission: RE | Admit: 2019-05-29 | Discharge: 2019-05-29 | Disposition: A | Discharge status: Home / Self Care | Payer: Medicare (Managed Care) | Source: Ambulatory Visit | Attending: Orthopedic Surgery | Admitting: Orthopedic Surgery

## 2019-05-29 ENCOUNTER — Other Ambulatory Visit: Payer: Self-pay | Admitting: Orthopedic Surgery

## 2019-05-29 DIAGNOSIS — Z1159 Encounter for screening for other viral diseases: Secondary | ICD-10-CM | POA: Diagnosis present

## 2019-05-29 LAB — SARS CORONAVIRUS 2 (TAT 6-24 HRS): SARS Coronavirus 2: NEGATIVE

## 2019-05-29 NOTE — Progress Notes (Signed)
RN spoke with Darrelyn Hillock at [facility] to inform her that patient would need to arrive by 9:30 on Monday June 22nd and will  first be tested for covid with rapid test. Kennyth Lose states that she was contacted by Dr Berenice Primas office and was instructed that patient had to go to be tested for covid  today. RN informed Kennyth Lose that plan was for covid to be done on Monday morning but if he gets test today and results are back before time of surgery, then that would be acceptable.  If not then patient will be tested again Monday morning. Additionally RN  informed Kennyth Lose that patient MUST quarantine from time of covid test until day of surgery and that wife would need to wear mask in home around the patient . Kennyth Lose verbalized understanding and states she wil inform patient wife of this. RN then spoke with  dr graves surgery scheduler, stephanie. Forbes Cellar states that she called over to  [place] covid test center and spoke with Janett Billow and was told that patient could come today between 9 and 3 to have covid test done. Colletta Maryland states she then contacted Darrelyn Hillock at [facility] to coordinate transportation to covid testing center. RN informed Colletta Maryland that patient must quarantine after covid test until day of surgery . RN then informed Colletta Maryland that RN would attempt to provide the ordered ERAS drink to patient when he is brought for covid test today.  RN then called Darrelyn Hillock  back to inform that RN would give ERAS drink and instructions to wife when she comes with patient via [facility] for covid . Kennyth Lose states wife will not be in the [place] but RN should give drink and instructions to [facility] driver who could then give to her when patient comes back to [facility] facility and then Kennyth Lose would give it to patient wife. RN then took ERAS drink and instructions, chg soap, and quarantine instructions flyer to [place] at the [place] covid testing center . Janett Billow to give these to [facility] driver on arrival.   RN then called over  to short stay and spoke with Santiago Glad sheppard to inform of the above mentioned.

## 2019-05-29 NOTE — Progress Notes (Signed)
RN called office of Dr Ardeen Fillers , phone [phone], to get fax number to send pacemaker orders. Per receptionist, providers gone for the day , s; she asked RN to send orders STAT to [phone] and it would be given to [facility] to sign off on on Monday. RN informed that surgery starts at 1230 and to please try to have the signed orders faxed before that time

## 2019-05-29 NOTE — Progress Notes (Signed)
YOU ARE REQUIRED TO BE TESTED FOR COVID-19 PRIOR TO YOUR SURGERY . ONCE YOUR COVID TEST IS COMPLETED, PLEASE BEGIN THE QUARANTINE INSTRUCTIONS AS OUTLINED IN YOUR HANDOUT.                 Steven Duffy   Your procedure is scheduled on: 06-01-2019   Report to [facility] Main  Entrance  Report to admitting at 9:30AM      Call this number if you have problems the morning of surgery [place], NO CHEWING GUM [place].  Do not eat food After Midnight. YOU MAY HAVE CLEAR LIQUIDS FROM MIDNIGHT UNTIL 9:00AM. At 9:00AM Please finish the prescribed Pre-Surgery ENSURE drink. Nothing by mouth after you finish the ENSURE drink !      CLEAR LIQUID DIET   Foods Allowed                                                                     Foods Excluded  Coffee and tea, regular and decaf                             liquids that you cannot  Plain Jell-O in any flavor                                             see through such as: Fruit ices (not with fruit pulp)                                     milk, soups, orange juice  Iced Popsicles                                    All solid food Carbonated beverages, regular and diet                                    Cranberry, grape and apple juices Sports drinks like Gatorade Lightly seasoned clear broth or consume(fat free) Sugar, honey syrup  Sample Menu Breakfast                                Lunch                                     Supper Cranberry juice                    Beef broth                            Chicken broth Jell-O  Grape juice                           Apple juice Coffee or tea                        Jell-O                                      Popsicle                                                Coffee or tea                        Coffee or  tea  _____________________________________________________________________         Take these medicines the morning of surgery with A SIP OF WATER: BUPROPION, DIGOXIN, LEVOTHYROXINE                                   You may not have any metal on your body including hair pins and piercings  Do not wear jewelry, make-up, lotions, powders or perfumes, deodorant Men may shave face and neck.   Do not bring valuables to the hospital. Steven Duffy.  Contacts, dentures or bridgework may not be worn into surgery.  Leave suitcase in the car. After surgery it may be brought to your room.     Patients discharged the day of surgery will not be allowed to drive home. IF YOU ARE HAVING SURGERY AND GOING HOME THE SAME DAY, YOU MUST HAVE AN ADULT TO DRIVE YOU HOME AND BE WITH YOU FOR 24 HOURS. YOU MAY GO HOME BY TAXI OR UBER OR ORTHERWISE, BUT AN ADULT MUST ACCOMPANY YOU HOME AND STAY WITH YOU FOR 24 HOURS.  Name and phone number of your driver:  Special Instructions: N/A              Please read over the following fact sheets you were given: _____________________________________________________________________             [name] - Preparing for Surgery Before surgery, you can play an important role.  Because skin is not sterile, your skin needs to be as free of germs as possible.  You can reduce the number of germs on your skin by washing with CHG (chlorahexidine gluconate) soap before surgery.  CHG is an antiseptic cleaner which kills germs and bonds with the skin to continue killing germs even after washing. Please DO NOT use if you have an allergy to CHG or antibacterial soaps.  If your skin becomes reddened/irritated stop using the CHG and inform your nurse when you arrive at Short Stay. Do not shave (including legs and underarms) for at least 48 hours prior to the first CHG shower.  You may shave your face/neck. Please follow these  instructions carefully:  1.  Shower with CHG Soap the night before surgery and the  morning of Surgery.  2.  If you choose to wash your hair, wash your hair  first as usual with your  normal  shampoo.  3.  After you shampoo, rinse your hair and body thoroughly to remove the  shampoo.                           4.  Use CHG as you would any other liquid soap.  You can apply chg directly  to the skin and wash                       Gently with a scrungie or clean washcloth.  5.  Apply the CHG Soap to your body ONLY FROM THE NECK DOWN.   Do not use on face/ open                           Wound or open sores. Avoid contact with eyes, ears mouth and genitals (private parts).                       Wash face,  Genitals (private parts) with your normal soap.             6.  Wash thoroughly, paying special attention to the area where your surgery  will be performed.  7.  Thoroughly rinse your body with warm water from the neck down.  8.  DO NOT shower/wash with your normal soap after using and rinsing off  the CHG Soap.                9.  Pat yourself dry with a clean towel.            10.  Wear clean pajamas.            11.  Place clean sheets on your bed the night of your first shower and do not  sleep with pets. Day of Surgery : Do not apply any lotions/deodorants the morning of surgery.  Please wear clean clothes to the hospital/surgery center.  FAILURE TO FOLLOW THESE INSTRUCTIONS MAY RESULT IN THE CANCELLATION OF YOUR SURGERY PATIENT SIGNATURE_________________________________  NURSE SIGNATURE__________________________________  ________________________________________________________________________
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
## 2019-06-01 ENCOUNTER — Encounter (HOSPITAL_COMMUNITY)
Admission: RE | Disposition: A | Discharge status: Home / Self Care | Payer: Self-pay | Source: Home / Self Care | Attending: Orthopedic Surgery

## 2019-06-01 ENCOUNTER — Other Ambulatory Visit: Payer: Self-pay

## 2019-06-01 ENCOUNTER — Ambulatory Visit (HOSPITAL_COMMUNITY): Payer: Medicare (Managed Care) | Admitting: Certified Registered"

## 2019-06-01 ENCOUNTER — Ambulatory Visit (HOSPITAL_COMMUNITY)
Admission: RE | Admit: 2019-06-01 | Discharge: 2019-06-02 | Disposition: A | Discharge status: Home / Self Care | Payer: Medicare (Managed Care) | Attending: Orthopedic Surgery | Admitting: Orthopedic Surgery

## 2019-06-01 ENCOUNTER — Encounter (HOSPITAL_COMMUNITY): Payer: Self-pay

## 2019-06-01 DIAGNOSIS — Z87891 Personal history of nicotine dependence: Secondary | ICD-10-CM | POA: Diagnosis not present

## 2019-06-01 DIAGNOSIS — K219 Gastro-esophageal reflux disease without esophagitis: Secondary | ICD-10-CM | POA: Diagnosis not present

## 2019-06-01 DIAGNOSIS — D631 Anemia in chronic kidney disease: Secondary | ICD-10-CM | POA: Diagnosis not present

## 2019-06-01 DIAGNOSIS — E11621 Type 2 diabetes mellitus with foot ulcer: Secondary | ICD-10-CM | POA: Insufficient documentation

## 2019-06-01 DIAGNOSIS — I129 Hypertensive chronic kidney disease with stage 1 through stage 4 chronic kidney disease, or unspecified chronic kidney disease: Secondary | ICD-10-CM | POA: Diagnosis not present

## 2019-06-01 DIAGNOSIS — E785 Hyperlipidemia, unspecified: Secondary | ICD-10-CM | POA: Insufficient documentation

## 2019-06-01 DIAGNOSIS — E1139 Type 2 diabetes mellitus with other diabetic ophthalmic complication: Secondary | ICD-10-CM | POA: Insufficient documentation

## 2019-06-01 DIAGNOSIS — Z880 Allergy status to penicillin: Secondary | ICD-10-CM | POA: Insufficient documentation

## 2019-06-01 DIAGNOSIS — F1021 Alcohol dependence, in remission: Secondary | ICD-10-CM | POA: Insufficient documentation

## 2019-06-01 DIAGNOSIS — I4891 Unspecified atrial fibrillation: Secondary | ICD-10-CM | POA: Diagnosis not present

## 2019-06-01 DIAGNOSIS — M1990 Unspecified osteoarthritis, unspecified site: Secondary | ICD-10-CM | POA: Diagnosis not present

## 2019-06-01 DIAGNOSIS — Z950 Presence of cardiac pacemaker: Secondary | ICD-10-CM | POA: Insufficient documentation

## 2019-06-01 DIAGNOSIS — L97529 Non-pressure chronic ulcer of other part of left foot with unspecified severity: Secondary | ICD-10-CM | POA: Insufficient documentation

## 2019-06-01 DIAGNOSIS — Z7901 Long term (current) use of anticoagulants: Secondary | ICD-10-CM | POA: Insufficient documentation

## 2019-06-01 DIAGNOSIS — M86672 Other chronic osteomyelitis, left ankle and foot: Secondary | ICD-10-CM | POA: Diagnosis not present

## 2019-06-01 DIAGNOSIS — I2510 Atherosclerotic heart disease of native coronary artery without angina pectoris: Secondary | ICD-10-CM | POA: Diagnosis not present

## 2019-06-01 DIAGNOSIS — F329 Major depressive disorder, single episode, unspecified: Secondary | ICD-10-CM | POA: Insufficient documentation

## 2019-06-01 DIAGNOSIS — E1140 Type 2 diabetes mellitus with diabetic neuropathy, unspecified: Secondary | ICD-10-CM | POA: Diagnosis not present

## 2019-06-01 DIAGNOSIS — E1122 Type 2 diabetes mellitus with diabetic chronic kidney disease: Secondary | ICD-10-CM | POA: Diagnosis not present

## 2019-06-01 DIAGNOSIS — C3490 Malignant neoplasm of unspecified part of unspecified bronchus or lung: Secondary | ICD-10-CM | POA: Insufficient documentation

## 2019-06-01 DIAGNOSIS — Z8719 Personal history of other diseases of the digestive system: Secondary | ICD-10-CM | POA: Insufficient documentation

## 2019-06-01 DIAGNOSIS — G4730 Sleep apnea, unspecified: Secondary | ICD-10-CM | POA: Diagnosis not present

## 2019-06-01 DIAGNOSIS — Z79890 Hormone replacement therapy (postmenopausal): Secondary | ICD-10-CM | POA: Insufficient documentation

## 2019-06-01 DIAGNOSIS — Z87442 Personal history of urinary calculi: Secondary | ICD-10-CM | POA: Insufficient documentation

## 2019-06-01 DIAGNOSIS — Z91013 Allergy to seafood: Secondary | ICD-10-CM | POA: Insufficient documentation

## 2019-06-01 DIAGNOSIS — Z881 Allergy status to other antibiotic agents status: Secondary | ICD-10-CM | POA: Insufficient documentation

## 2019-06-01 DIAGNOSIS — N183 Chronic kidney disease, stage 3 (moderate): Secondary | ICD-10-CM | POA: Insufficient documentation

## 2019-06-01 DIAGNOSIS — F419 Anxiety disorder, unspecified: Secondary | ICD-10-CM | POA: Insufficient documentation

## 2019-06-01 DIAGNOSIS — M86372 Chronic multifocal osteomyelitis, left ankle and foot: Secondary | ICD-10-CM | POA: Diagnosis present

## 2019-06-01 DIAGNOSIS — Z79899 Other long term (current) drug therapy: Secondary | ICD-10-CM | POA: Insufficient documentation

## 2019-06-01 DIAGNOSIS — Z888 Allergy status to other drugs, medicaments and biological substances status: Secondary | ICD-10-CM | POA: Insufficient documentation

## 2019-06-01 DIAGNOSIS — H42 Glaucoma in diseases classified elsewhere: Secondary | ICD-10-CM | POA: Diagnosis not present

## 2019-06-01 DIAGNOSIS — Z8673 Personal history of transient ischemic attack (TIA), and cerebral infarction without residual deficits: Secondary | ICD-10-CM | POA: Diagnosis not present

## 2019-06-01 HISTORY — PX: AMPUTATION: SHX166

## 2019-06-01 LAB — CBC WITH DIFFERENTIAL/PLATELET
Abs Immature Granulocytes: 0.02 10*3/uL (ref 0.00–0.07)
Basophils Absolute: 0 10*3/uL (ref 0.0–0.1)
Basophils Relative: 0 %
Eosinophils Absolute: 0.2 10*3/uL (ref 0.0–0.5)
Eosinophils Relative: 3 %
HCT: 37 % — ABNORMAL LOW (ref 39.0–52.0)
Hemoglobin: 11.4 g/dL — ABNORMAL LOW (ref 13.0–17.0)
Immature Granulocytes: 0 %
Lymphocytes Relative: 18 %
Lymphs Abs: 1.1 10*3/uL (ref 0.7–4.0)
MCH: 31.4 pg (ref 26.0–34.0)
MCHC: 30.8 g/dL (ref 30.0–36.0)
MCV: 101.9 fL — ABNORMAL HIGH (ref 80.0–100.0)
Monocytes Absolute: 0.5 10*3/uL (ref 0.1–1.0)
Monocytes Relative: 8 %
Neutro Abs: 4.5 10*3/uL (ref 1.7–7.7)
Neutrophils Relative %: 71 %
Platelets: 164 10*3/uL (ref 150–400)
RBC: 3.63 MIL/uL — ABNORMAL LOW (ref 4.22–5.81)
RDW: 15 % (ref 11.5–15.5)
WBC: 6.3 10*3/uL (ref 4.0–10.5)
nRBC: 0 % (ref 0.0–0.2)

## 2019-06-01 LAB — URINALYSIS, ROUTINE W REFLEX MICROSCOPIC
Bilirubin Urine: NEGATIVE
Glucose, UA: NEGATIVE mg/dL
Hgb urine dipstick: NEGATIVE
Ketones, ur: NEGATIVE mg/dL
Leukocytes,Ua: NEGATIVE
Nitrite: NEGATIVE
Protein, ur: NEGATIVE mg/dL
Specific Gravity, Urine: 1.018 (ref 1.005–1.030)
pH: 5 (ref 5.0–8.0)

## 2019-06-01 LAB — COMPREHENSIVE METABOLIC PANEL
ALT: 14 U/L (ref 0–44)
AST: 19 U/L (ref 15–41)
Albumin: 3.5 g/dL (ref 3.5–5.0)
Alkaline Phosphatase: 97 U/L (ref 38–126)
Anion gap: 9 (ref 5–15)
BUN: 28 mg/dL — ABNORMAL HIGH (ref 8–23)
CO2: 18 mmol/L — ABNORMAL LOW (ref 22–32)
Calcium: 8.8 mg/dL — ABNORMAL LOW (ref 8.9–10.3)
Chloride: 113 mmol/L — ABNORMAL HIGH (ref 98–111)
Creatinine, Ser: 1.68 mg/dL — ABNORMAL HIGH (ref 0.61–1.24)
GFR calc Af Amer: 45 mL/min — ABNORMAL LOW (ref >60)
GFR calc non Af Amer: 39 mL/min — ABNORMAL LOW (ref >60)
Glucose, Bld: 163 mg/dL — ABNORMAL HIGH (ref 70–99)
Potassium: 3.2 mmol/L — ABNORMAL LOW (ref 3.5–5.1)
Sodium: 140 mmol/L (ref 135–145)
Total Bilirubin: 0.2 mg/dL — ABNORMAL LOW (ref 0.3–1.2)
Total Protein: 7 g/dL (ref 6.5–8.1)

## 2019-06-01 LAB — GLUCOSE, CAPILLARY
Glucose-Capillary: 236 mg/dL — ABNORMAL HIGH (ref 70–99)
Glucose-Capillary: 105 mg/dL — ABNORMAL HIGH (ref 70–99)

## 2019-06-01 LAB — APTT: aPTT: 40 seconds — ABNORMAL HIGH (ref 24–36)

## 2019-06-01 LAB — PROTIME-INR
INR: 1.2 (ref 0.8–1.2)
Prothrombin Time: 14.6 seconds (ref 11.4–15.2)

## 2019-06-01 SURGERY — AMPUTATION, FOOT, PARTIAL
Anesthesia: General | Laterality: Left

## 2019-06-01 MED ORDER — CHLORHEXIDINE GLUCONATE 4 % EX LIQD: 60.0000 mL | Freq: Once | CUTANEOUS | Status: DC

## 2019-06-01 MED ORDER — MAGNESIUM OXIDE 400 (241.3 MG) MG PO TABS
400.0000 mg | ORAL_TABLET | Freq: Every day | ORAL | Status: DC
  Administered 2019-06-02: 400 mg via ORAL
  Filled 2019-06-01: qty 1

## 2019-06-01 MED ORDER — FENTANYL CITRATE (PF) 100 MCG/2ML IJ SOLN
25.0000 ug | INTRAMUSCULAR | Status: DC | PRN
  Administered 2019-06-01: 25 ug via INTRAVENOUS
  Administered 2019-06-01: 50 ug via INTRAVENOUS
  Administered 2019-06-01: 25 ug via INTRAVENOUS

## 2019-06-01 MED ORDER — DEXAMETHASONE SODIUM PHOSPHATE 10 MG/ML IJ SOLN
INTRAMUSCULAR | Status: DC | PRN
  Administered 2019-06-01: 5 mg via INTRAVENOUS

## 2019-06-01 MED ORDER — FENTANYL CITRATE (PF) 100 MCG/2ML IJ SOLN
INTRAMUSCULAR | Status: AC
  Filled 2019-06-01: qty 2

## 2019-06-01 MED ORDER — PRAVASTATIN SODIUM 20 MG PO TABS
20.0000 mg | ORAL_TABLET | Freq: Every evening | ORAL | Status: DC
  Administered 2019-06-01: 20 mg via ORAL
  Filled 2019-06-01: qty 1

## 2019-06-01 MED ORDER — ESCITALOPRAM OXALATE 20 MG PO TABS
20.0000 mg | ORAL_TABLET | Freq: Every day | ORAL | Status: DC
  Administered 2019-06-02: 20 mg via ORAL
  Filled 2019-06-01: qty 1

## 2019-06-01 MED ORDER — VANCOMYCIN HCL IN DEXTROSE 1-5 GM/200ML-% IV SOLN
1000.0000 mg | INTRAVENOUS | Status: AC
  Administered 2019-06-01: 1000 mg via INTRAVENOUS
  Filled 2019-06-01: qty 200

## 2019-06-01 MED ORDER — ACETAMINOPHEN 160 MG/5ML PO SOLN: 325.0000 mg | ORAL | Status: DC | PRN

## 2019-06-01 MED ORDER — ACETAMINOPHEN 325 MG PO TABS: 325.0000 mg | ORAL_TABLET | ORAL | Status: DC | PRN

## 2019-06-01 MED ORDER — GABAPENTIN 300 MG PO CAPS
300.0000 mg | ORAL_CAPSULE | Freq: Two times a day (BID) | ORAL | Status: DC
  Administered 2019-06-01 – 2019-06-02 (×2): 300 mg via ORAL
  Filled 2019-06-01 (×2): qty 1

## 2019-06-01 MED ORDER — TAMSULOSIN HCL 0.4 MG PO CAPS
0.4000 mg | ORAL_CAPSULE | Freq: Every day | ORAL | Status: DC
  Administered 2019-06-01 – 2019-06-02 (×2): 0.4 mg via ORAL
  Filled 2019-06-01 (×2): qty 1

## 2019-06-01 MED ORDER — VANCOMYCIN HCL IN DEXTROSE 1-5 GM/200ML-% IV SOLN
1000.0000 mg | Freq: Two times a day (BID) | INTRAVENOUS | Status: AC
  Administered 2019-06-01: 1000 mg via INTRAVENOUS
  Filled 2019-06-01: qty 200

## 2019-06-01 MED ORDER — LACTATED RINGERS IV SOLN
INTRAVENOUS | Status: DC
  Administered 2019-06-01: 11:00:00 via INTRAVENOUS

## 2019-06-01 MED ORDER — FENTANYL CITRATE (PF) 250 MCG/5ML IJ SOLN
INTRAMUSCULAR | Status: AC
  Filled 2019-06-01: qty 5

## 2019-06-01 MED ORDER — MAGNESIUM OXIDE 400 MG PO TABS: 400.0000 mg | ORAL_TABLET | ORAL | Status: DC

## 2019-06-01 MED ORDER — HYDROMORPHONE HCL 1 MG/ML IJ SOLN
0.5000 mg | INTRAMUSCULAR | Status: DC | PRN
  Administered 2019-06-01 (×2): 1 mg via INTRAVENOUS
  Filled 2019-06-01 (×2): qty 1

## 2019-06-01 MED ORDER — ONDANSETRON HCL 4 MG PO TABS: 4.0000 mg | ORAL_TABLET | Freq: Four times a day (QID) | ORAL | Status: DC | PRN

## 2019-06-01 MED ORDER — DORZOLAMIDE HCL-TIMOLOL MAL 2-0.5 % OP SOLN
1.0000 [drp] | Freq: Two times a day (BID) | OPHTHALMIC | Status: DC
  Administered 2019-06-01 – 2019-06-02 (×2): 1 [drp] via OPHTHALMIC
  Filled 2019-06-01: qty 10

## 2019-06-01 MED ORDER — ATROPINE SULFATE 1 % OP SOLN
1.0000 [drp] | Freq: Every day | OPHTHALMIC | Status: DC
  Administered 2019-06-01 – 2019-06-02 (×2): 1 [drp] via OPHTHALMIC
  Filled 2019-06-01: qty 2

## 2019-06-01 MED ORDER — MEPERIDINE HCL 50 MG/ML IJ SOLN: 6.2500 mg | INTRAMUSCULAR | Status: DC | PRN

## 2019-06-01 MED ORDER — BUPROPION HCL ER (XL) 150 MG PO TB24
150.0000 mg | ORAL_TABLET | Freq: Every day | ORAL | Status: DC
  Administered 2019-06-01 – 2019-06-02 (×2): 150 mg via ORAL
  Filled 2019-06-01 (×2): qty 1

## 2019-06-01 MED ORDER — SODIUM CHLORIDE 0.9 % IV SOLN
INTRAVENOUS | Status: DC | PRN
  Administered 2019-06-01: 10 ug/min via INTRAVENOUS

## 2019-06-01 MED ORDER — OXYCODONE HCL 5 MG PO TABS
5.0000 mg | ORAL_TABLET | ORAL | Status: DC | PRN
  Administered 2019-06-01: 5 mg via ORAL
  Administered 2019-06-02 (×2): 10 mg via ORAL
  Filled 2019-06-01: qty 1
  Filled 2019-06-01 (×3): qty 2

## 2019-06-01 MED ORDER — LIDOCAINE 2% (20 MG/ML) 5 ML SYRINGE
INTRAMUSCULAR | Status: DC | PRN
  Administered 2019-06-01: 100 mg via INTRAVENOUS

## 2019-06-01 MED ORDER — PILOCARPINE HCL 1 % OP SOLN
1.0000 [drp] | Freq: Three times a day (TID) | OPHTHALMIC | Status: DC
  Administered 2019-06-01 – 2019-06-02 (×2): 1 [drp] via OPHTHALMIC
  Filled 2019-06-01: qty 15

## 2019-06-01 MED ORDER — ACETAMINOPHEN 500 MG PO TABS
1000.0000 mg | ORAL_TABLET | Freq: Four times a day (QID) | ORAL | Status: AC
  Administered 2019-06-01 – 2019-06-02 (×4): 1000 mg via ORAL
  Filled 2019-06-01 (×5): qty 2

## 2019-06-01 MED ORDER — DOCUSATE SODIUM 50 MG PO CAPS: 250.0000 mg | ORAL_CAPSULE | Freq: Every day | ORAL | Status: DC

## 2019-06-01 MED ORDER — METHOCARBAMOL 500 MG PO TABS
500.0000 mg | ORAL_TABLET | Freq: Four times a day (QID) | ORAL | Status: DC | PRN
  Filled 2019-06-01: qty 1

## 2019-06-01 MED ORDER — OXYCODONE HCL 5 MG/5ML PO SOLN: 5.0000 mg | Freq: Once | ORAL | Status: DC | PRN

## 2019-06-01 MED ORDER — FAMOTIDINE 20 MG PO TABS
20.0000 mg | ORAL_TABLET | Freq: Every day | ORAL | Status: DC
  Administered 2019-06-01: 20 mg via ORAL
  Filled 2019-06-01: qty 1

## 2019-06-01 MED ORDER — DORZOLAMIDE HCL-TIMOLOL MAL PF 22.3-6.8 MG/ML OP SOLN: 1.0000 [drp] | Freq: Two times a day (BID) | OPHTHALMIC | Status: DC

## 2019-06-01 MED ORDER — VANCOMYCIN HCL IN DEXTROSE 1-5 GM/200ML-% IV SOLN: 1000.0000 mg | INTRAVENOUS | Status: DC

## 2019-06-01 MED ORDER — TIZANIDINE HCL 2 MG PO TABS: 2.0000 mg | ORAL_TABLET | Freq: Three times a day (TID) | ORAL | 0 refills | Status: AC | PRN

## 2019-06-01 MED ORDER — POTASSIUM CHLORIDE CRYS ER 20 MEQ PO TBCR
20.0000 meq | EXTENDED_RELEASE_TABLET | Freq: Every day | ORAL | Status: DC
  Administered 2019-06-01 – 2019-06-02 (×2): 20 meq via ORAL
  Filled 2019-06-01 (×2): qty 1

## 2019-06-01 MED ORDER — OXYCODONE-ACETAMINOPHEN 5-325 MG PO TABS: 1.0000 | ORAL_TABLET | Freq: Four times a day (QID) | ORAL | 0 refills | Status: AC | PRN

## 2019-06-01 MED ORDER — LATANOPROST 0.005 % OP SOLN
1.0000 [drp] | Freq: Every day | OPHTHALMIC | Status: DC
  Administered 2019-06-01: 21:00:00 1 [drp] via OPHTHALMIC
  Filled 2019-06-01: qty 2.5

## 2019-06-01 MED ORDER — SODIUM CHLORIDE 0.9 % IR SOLN
Status: DC | PRN
  Administered 2019-06-01: 1000 mL

## 2019-06-01 MED ORDER — FUROSEMIDE 40 MG PO TABS
40.0000 mg | ORAL_TABLET | Freq: Every day | ORAL | Status: DC
  Administered 2019-06-02: 40 mg via ORAL
  Filled 2019-06-01: qty 1

## 2019-06-01 MED ORDER — ONDANSETRON HCL 4 MG/2ML IJ SOLN: 4.0000 mg | Freq: Once | INTRAMUSCULAR | Status: DC | PRN

## 2019-06-01 MED ORDER — ACETAZOLAMIDE 250 MG PO TABS
250.0000 mg | ORAL_TABLET | Freq: Three times a day (TID) | ORAL | Status: DC
  Administered 2019-06-01 – 2019-06-02 (×2): 250 mg via ORAL
  Filled 2019-06-01 (×3): qty 1

## 2019-06-01 MED ORDER — FINASTERIDE 5 MG PO TABS
5.0000 mg | ORAL_TABLET | Freq: Every day | ORAL | Status: DC
  Administered 2019-06-02: 5 mg via ORAL
  Filled 2019-06-01: qty 1

## 2019-06-01 MED ORDER — ONDANSETRON HCL 4 MG/2ML IJ SOLN
INTRAMUSCULAR | Status: DC | PRN
  Administered 2019-06-01: 4 mg via INTRAVENOUS

## 2019-06-01 MED ORDER — PROPOFOL 10 MG/ML IV BOLUS
INTRAVENOUS | Status: DC | PRN
  Administered 2019-06-01: 120 mg via INTRAVENOUS
  Administered 2019-06-01: 80 mg via INTRAVENOUS

## 2019-06-01 MED ORDER — METHOCARBAMOL 500 MG IVPB - SIMPLE MED
500.0000 mg | Freq: Four times a day (QID) | INTRAVENOUS | Status: DC | PRN
  Administered 2019-06-01: 500 mg via INTRAVENOUS
  Filled 2019-06-01: qty 50

## 2019-06-01 MED ORDER — DOCUSATE SODIUM 100 MG PO CAPS
100.0000 mg | ORAL_CAPSULE | Freq: Two times a day (BID) | ORAL | Status: DC
  Administered 2019-06-01 – 2019-06-02 (×2): 100 mg via ORAL
  Filled 2019-06-01 (×2): qty 1

## 2019-06-01 MED ORDER — DEXMEDETOMIDINE HCL IN NACL 200 MCG/50ML IV SOLN
INTRAVENOUS | Status: AC
  Filled 2019-06-01: qty 50

## 2019-06-01 MED ORDER — DIGOXIN 0.0625 MG HALF TABLET
0.0625 mg | ORAL_TABLET | Freq: Every day | ORAL | Status: DC
  Administered 2019-06-02: 0.0625 mg via ORAL
  Filled 2019-06-01: qty 1

## 2019-06-01 MED ORDER — PHENYLEPHRINE 40 MCG/ML (10ML) SYRINGE FOR IV PUSH (FOR BLOOD PRESSURE SUPPORT)
PREFILLED_SYRINGE | INTRAVENOUS | Status: DC | PRN
  Administered 2019-06-01 (×5): 80 ug via INTRAVENOUS

## 2019-06-01 MED ORDER — METHOCARBAMOL 500 MG IVPB - SIMPLE MED
INTRAVENOUS | Status: AC
  Filled 2019-06-01: qty 50

## 2019-06-01 MED ORDER — LEVOTHYROXINE SODIUM 75 MCG PO TABS
37.5000 ug | ORAL_TABLET | Freq: Every day | ORAL | Status: DC
  Administered 2019-06-02: 37.5 ug via ORAL
  Filled 2019-06-01: qty 1

## 2019-06-01 MED ORDER — ONDANSETRON HCL 4 MG/2ML IJ SOLN: 4.0000 mg | Freq: Four times a day (QID) | INTRAMUSCULAR | Status: DC | PRN

## 2019-06-01 MED ORDER — OXYCODONE HCL 5 MG PO TABS: 5.0000 mg | ORAL_TABLET | Freq: Once | ORAL | Status: DC | PRN

## 2019-06-01 MED ORDER — ACETAMINOPHEN 325 MG PO TABS: 325.0000 mg | ORAL_TABLET | Freq: Four times a day (QID) | ORAL | Status: DC | PRN

## 2019-06-01 MED ORDER — FENTANYL CITRATE (PF) 100 MCG/2ML IJ SOLN
INTRAMUSCULAR | Status: DC | PRN
  Administered 2019-06-01 (×2): 25 ug via INTRAVENOUS

## 2019-06-01 SURGICAL SUPPLY — 50 items
BAG ZIPLOCK 12X15 (MISCELLANEOUS) ×3 IMPLANT
BANDAGE ACE 4X5 VEL STRL LF (GAUZE/BANDAGES/DRESSINGS) ×3 IMPLANT
BANDAGE ACE 6X5 VEL STRL LF (GAUZE/BANDAGES/DRESSINGS) ×5 IMPLANT
BANDAGE ESMARK 6X9 LF (GAUZE/BANDAGES/DRESSINGS) ×1 IMPLANT
BLADE SAW GIGLI 510 (BLADE) ×1 IMPLANT
BLADE SAW GIGLI 510MM (BLADE)
BLADE SAW SAG 73X25 THK (BLADE) ×2
BLADE SAW SGTL 73X25 THK (BLADE) ×1 IMPLANT
BNDG COHESIVE 6X5 TAN STRL LF (GAUZE/BANDAGES/DRESSINGS) ×2 IMPLANT
BNDG ESMARK 6X9 LF (GAUZE/BANDAGES/DRESSINGS) ×3
BNDG GAUZE ELAST 4 BULKY (GAUZE/BANDAGES/DRESSINGS) ×6 IMPLANT
COVER MAYO STAND STRL (DRAPES) ×2 IMPLANT
COVER SURGICAL LIGHT HANDLE (MISCELLANEOUS) ×3 IMPLANT
COVER WAND RF STERILE (DRAPES) IMPLANT
CUFF TOURN SGL QUICK 34 (TOURNIQUET CUFF) ×2
CUFF TRNQT CYL 34X4.125X (TOURNIQUET CUFF) ×1 IMPLANT
DRAIN PENROSE 18X1/2 LTX STRL (DRAIN) ×3 IMPLANT
DRAPE INCISE 23X17 IOBAN STRL (DRAPES) ×2
DRAPE INCISE 23X17 STRL (DRAPES) IMPLANT
DRAPE INCISE IOBAN 23X17 STRL (DRAPES) ×1 IMPLANT
DRAPE U-SHAPE 47X51 STRL (DRAPES) ×3 IMPLANT
DRSG EMULSION OIL 3X16 NADH (GAUZE/BANDAGES/DRESSINGS) ×3 IMPLANT
DRSG PAD ABDOMINAL 8X10 ST (GAUZE/BANDAGES/DRESSINGS) ×3 IMPLANT
DURAPREP 26ML APPLICATOR (WOUND CARE) ×3 IMPLANT
ELECT REM PT RETURN 15FT ADLT (MISCELLANEOUS) ×3 IMPLANT
GAUZE SPONGE 4X4 12PLY STRL (GAUZE/BANDAGES/DRESSINGS) ×4 IMPLANT
GAUZE XEROFORM 5X9 LF (GAUZE/BANDAGES/DRESSINGS) ×2 IMPLANT
GLOVE BIO SURGEON STRL SZ8 (GLOVE) ×3 IMPLANT
GLOVE BIOGEL PI IND STRL 7.5 (GLOVE) ×2 IMPLANT
GLOVE BIOGEL PI INDICATOR 7.5 (GLOVE) ×4
GLOVE ECLIPSE 7.5 STRL STRAW (GLOVE) ×6 IMPLANT
HOOD PEEL AWAY FLYTE STAYCOOL (MISCELLANEOUS) ×6 IMPLANT
KIT TURNOVER KIT A (KITS) IMPLANT
MANIFOLD NEPTUNE II (INSTRUMENTS) ×3 IMPLANT
NS IRRIG 1000ML POUR BTL (IV SOLUTION) ×3 IMPLANT
PACK ORTHO EXTREMITY (CUSTOM PROCEDURE TRAY) ×3 IMPLANT
PROTECTOR NERVE ULNAR (MISCELLANEOUS) ×3 IMPLANT
SPONGE LAP 18X18 RF (DISPOSABLE) ×3 IMPLANT
STAPLER VISISTAT (STAPLE) ×3 IMPLANT
STAPLER VISISTAT 35W (STAPLE) ×3 IMPLANT
STOCKINETTE 8 INCH (MISCELLANEOUS) ×3 IMPLANT
SUT BONE WAX W31G (SUTURE) ×2 IMPLANT
SUT SILK 2 0 (SUTURE) ×2
SUT SILK 2 0 SH CR/8 (SUTURE) ×3 IMPLANT
SUT SILK 2-0 18XBRD TIE 12 (SUTURE) ×1 IMPLANT
SUT VIC AB 0 CT1 36 (SUTURE) ×4 IMPLANT
SUT VIC AB 1 CT1 36 (SUTURE) ×6 IMPLANT
SUT VIC AB 2-0 CT1 27 (SUTURE) ×6
SUT VIC AB 2-0 CT1 27XBRD (SUTURE) ×3 IMPLANT
WATER STERILE IRR 1000ML POUR (IV SOLUTION) ×3 IMPLANT

## 2019-06-01 NOTE — Transfer of Care (Signed)
Immediate Anesthesia Transfer of Care Note  Patient: CALIN FANTROY  Procedure(s) Performed: BELOW KNEE AMPUTATION (Left )  Patient Location: PACU  Anesthesia Type:General  Level of Consciousness: sedated, patient cooperative and responds to stimulation  Airway & Oxygen Therapy: Patient Spontanous Breathing and Patient connected to face mask oxygen  Post-op Assessment: Report given to RN and Post -op Vital signs reviewed and stable  Post vital signs: Reviewed and stable  Last Vitals:  Vitals Value Taken Time  BP 127/77 06/01/19 1502  Temp    Pulse 79 06/01/19 1504  Resp 13 06/01/19 1504  SpO2 100 % 06/01/19 1504  Vitals shown include unvalidated device data.  Last Pain:  Vitals:   06/01/19 1025  TempSrc:   PainSc: 0-No pain         Complications: No apparent anesthesia complications

## 2019-06-01 NOTE — Progress Notes (Signed)
Pt unable to void; bladder scan showed 552ml residual; in and out cath done with 734ml output; clear and yellow; pt tolerated the procedure; no signs of distress

## 2019-06-01 NOTE — Brief Op Note (Signed)
06/01/2019  2:40 PM  PATIENT:  [name]  78 y.o. male  PRE-OPERATIVE DIAGNOSIS:  CHRONIC INFECTED LEFT FOOT  POST-OPERATIVE DIAGNOSIS:  CHRONIC INFECTED LEFT FOOT  PROCEDURE:  Procedure(s): BELOW KNEE AMPUTATION (Left)  SURGEON:  Surgeon(s) and Role:    Dorna Leitz, MD - Primary  PHYSICIAN ASSISTANT:   ASSISTANTS: bethune pac    ANESTHESIA:   general  EBL:  75 mL   BLOOD ADMINISTERED:none  DRAINS: Penrose drain in the l leg wound   LOCAL MEDICATIONS USED:  NONE  SPECIMEN:  Source of Specimen:  l leg  DISPOSITION OF SPECIMEN:  N/A  COUNTS:  YES  TOURNIQUET:   Total Tourniquet Time Documented: Thigh (Left) - 24 minutes Total: Thigh (Left) - 24 minutes   DICTATION: .Other Dictation: Dictation Number [id number]  PLAN OF CARE: Admit for overnight observation  PATIENT DISPOSITION:  PACU - hemodynamically stable.   Delay start of Pharmacological VTE agent (>24hrs) due to surgical blood loss or risk of bleeding: yes

## 2019-06-01 NOTE — Anesthesia Preprocedure Evaluation (Addendum)
Anesthesia Evaluation  Patient identified by MRN, date of birth, ID band Patient awake    Reviewed: Allergy & Precautions, NPO status , Patient's Chart, lab work & pertinent test results  Airway Mallampati: III  TM Distance: >3 FB Neck ROM: Limited  Mouth opening: Limited Mouth Opening  Dental no notable dental hx. (+) Teeth Intact, Dental Advisory Given   Pulmonary sleep apnea (BiPAP) , former smoker,    Pulmonary exam normal breath sounds clear to auscultation       Cardiovascular hypertension, Pt. on medications + CAD  Normal cardiovascular exam+ dysrhythmias Atrial Fibrillation + pacemaker (for bradycardia)  Rhythm:Irregular Rate:Normal  TTE 06/2018 - Left ventricle: The cavity size was normal. Wall thickness was increased in a pattern of moderate LVH. Systolic function was normal. The estimated ejection fraction was in the range of 50% to 55%. Wall motion was normal; there were no regional wall motion abnormalities. The study is not technically sufficient to allow evaluation of LV diastolic function. - Right ventricle: The cavity size was mildly dilated. Impressions: - Technically difficult; no apical views; low normal LV systolic function; moderate LVH; mild RVE.   Neuro/Psych  Headaches, PSYCHIATRIC DISORDERS Anxiety Depression CVA (left sided hemiparesis), Residual Symptoms    GI/Hepatic Neg liver ROS, GERD  ,  Endo/Other  negative endocrine ROSdiabetes, Type 2  Renal/GU Renal disease (CKD Stage 3)  negative genitourinary   Musculoskeletal  (+) Arthritis , Osteoarthritis,    Abdominal   Peds  Hematology  (+) Blood dyscrasia, anemia ,   Anesthesia Other Findings Non small cell lung cancer   On xarelto  Reproductive/Obstetrics                             Anesthesia Physical  Anesthesia Plan  ASA: IV  Anesthesia Plan: General   Post-op Pain Management:    Induction:  Intravenous  PONV Risk Score and Plan: 2 and Treatment may vary due to age or medical condition and Ondansetron  Airway Management Planned: Oral ETT, Video Laryngoscope Planned and LMA  Additional Equipment: Arterial line  Intra-op Plan:   Post-operative Plan: Extubation in OR  Informed Consent: I have reviewed the patients History and Physical, chart, labs and discussed the procedure including the risks, benefits and alternatives for the proposed anesthesia with the patient or authorized representative who has indicated his/her understanding and acceptance.     Dental advisory given  Plan Discussed with: CRNA, Anesthesiologist and Surgeon  Anesthesia Plan Comments:        Anesthesia Quick Evaluation

## 2019-06-01 NOTE — Anesthesia Postprocedure Evaluation (Signed)
Anesthesia Post Note  Patient: Steven Duffy  Procedure(s) Performed: BELOW KNEE AMPUTATION (Left )     Patient location during evaluation: PACU Anesthesia Type: General Level of consciousness: awake and alert Pain management: pain level controlled Vital Signs Assessment: post-procedure vital signs reviewed and stable Respiratory status: spontaneous breathing, nonlabored ventilation, respiratory function stable and patient connected to nasal cannula oxygen Cardiovascular status: blood pressure returned to baseline and stable Postop Assessment: no apparent nausea or vomiting Anesthetic complications: no    Last Vitals:  Vitals:   06/01/19 1600 06/01/19 1615  BP: 138/76 110/67  Pulse: 70 70  Resp: 10 15  Temp:  (!) 36.4 C  SpO2: 100% 100%    Last Pain:  Vitals:   06/01/19 1615  TempSrc:   PainSc: 5                  Tishara Pizano

## 2019-06-01 NOTE — Op Note (Signed)
NAME: Steven Duffy, SASS MEDICAL RECORD ZO:[id number] ACCOUNT [id number] DATE OF BIRTH:1941/06/17 FACILITY: [facility] LOCATION: [facility] PHYSICIAN:Ashyr Hedgepath L. Ariannie Penaloza, MD  OPERATIVE REPORT  DATE OF PROCEDURE:  06/01/2019  PREOPERATIVE DIAGNOSIS:  Complex infected wound left foot, status post previous debridement with recurrent infection.  POSTOPERATIVE DIAGNOSIS:  Complex infected wound left foot, status post previous debridement with recurrent infection.  PROCEDURE:  Left below knee amputation.  SURGEON:  Dorna Leitz, MD  ASSISTANT:  Gaspar Skeeters PA-C, was present for the entire case and assisted by retraction, manipulation of tissues, and closing to minimize OR time.  BRIEF HISTORY:  The patient is a 78 year old male with a long history of significant complaints of left leg infection and draining wound being treated in a wound center.  We treated him with a massive debridement about a year ago and he did well  initially with that, but unfortunately, redeveloped some osteomyelitis down in the heel and began having some drainage.  It was fairly significant drainage.  We talked to him about treatment options and felt a below-knee amputation was the best treatment  option and he was brought to the operating room for this procedure.  The patient was having a lot of pain preoperatively.  We did have a long discussion with him about that.  We were not certain that his pain was coming from the infection, but at any  rate felt that he needed the definitive procedure to treat the infection and he was brought to the operating room with this procedure.  DESCRIPTION OF PROCEDURE:  The patient brought to the operating room after adequate anesthesia was obtained with general anesthetic, the patient was taken to the operating table.  Left leg was prepped and draped in usual sterile fashion.  Following this,  the leg was exsanguinated, blood pressure tourniquet inflated to 300 mmHg.  Following this, an incision was  made across the tibia at 8.5 cm distal to the joint line.  We then dissected an incision all the way around to the back 12 cm below the anterior  incision just to have some skin to fold up over.  We then sectioned the muscles just distal to the provisional tibial cut and then I dissected the lateral compartment with a hemostat just distal to the tibial cut to let the muscle retract there.  We put  hemostats around the bleeders we encountered and went and got the lateral compartment fixed.  I went to the fibula and sectioned the fibula about a centimeter higher than the tibial cut.  Went back to the tibia and bevelled the cut both anteriorly and  medially.  Once this was done, the attention was turned towards flexing the cut portion of the leg and dissecting all the posterior muscle free all the way to the skin cut.  We then used an amputation knife to bevel the posterior aspect.  Once it was  bevelled, we then folded the periosteum of the tibia into the posterior muscle fascia and got 3 nice stitches across here, which really gave nice muscle coverage to the distal tibia.  We then took the subcutaneous tissues and closed those with 0 Vicryl  interrupted and then used staples.  I put a through and through Penrose with the ability to pull it out of one side and felt that that would give best opportunity for drainage overnight.  He will be admitted for IV antibiotic therapy and observation  overnight, but should be able to go home as he has been essentially  bedbound or bed to chair over many years.  The family is comfortable taking care of him in that scenario.  TN/NUANCE  D:06/01/2019 T:06/01/2019 JOB:[id number]

## 2019-06-01 NOTE — Anesthesia Procedure Notes (Addendum)
Procedure Name: LMA Insertion Date/Time: 06/01/2019 1:21 PM Performed by: Silas [place], CRNA Pre-anesthesia Checklist: Patient identified and Emergency Drugs available Patient Re-evaluated:Patient Re-evaluated prior to induction Oxygen Delivery Method: Circle system utilized Preoxygenation: Pre-oxygenation with 100% oxygen Induction Type: IV induction LMA: LMA inserted LMA Size: 4.0 Number of attempts: 1 Placement Confirmation: ETT inserted through vocal cords under direct vision,  positive ETCO2 and breath sounds checked- equal and bilateral Tube secured with: Tape Dental Injury: Teeth and Oropharynx as per pre-operative assessment

## 2019-06-01 NOTE — Discharge Instructions (Signed)
Elevate left leg stump as much as possible on pillows. Try to keep pillows under the end of the stump and not directly under the knee so that it will allow his knee to come fully straight.

## 2019-06-02 ENCOUNTER — Encounter (HOSPITAL_COMMUNITY): Payer: Self-pay | Admitting: Orthopedic Surgery

## 2019-06-02 DIAGNOSIS — E11621 Type 2 diabetes mellitus with foot ulcer: Secondary | ICD-10-CM | POA: Diagnosis not present

## 2019-06-02 NOTE — Discharge Summary (Signed)
Patient ID: Steven Duffy MRN: [id number] DOB/AGE: September 24, 1941 78 y.o.  Admit date: 06/01/2019 Discharge date: 06/02/2019  Admission Diagnoses:  Principal Problem:   Chronic multifocal osteomyelitis of left foot ([place]) Active Problems:   Chronic osteomyelitis of left foot [facility])   Discharge Diagnoses:  Same  Past Medical History:  Diagnosis Date  . Alcohol dependence in remission ([place])   . Allergic rhinitis   . Anemia   . Anxiety    chronic  . Arthritis    osteoarthritis   . Atrial fibrillation ([place])   . Blind   . BPH (benign prostatic hypertrophy) with urinary obstruction   . Brachial neuritis or radiculitis   . Carotid bruit   . Colon polyps    hx of   . Coronary artery disease    non obstructive per cath 2005   . Depression   . Dermatitis   . Diabetes mellitus without complication ([place])    type II  . GERD (gastroesophageal reflux disease)   . Glaucoma   . Headache    hx of migraines   . Hematuria    hx of   . Hemorrhoids   . History of kidney stones   . Hyperlipidemia   . Hypertension   . Kidney stones    stage 3  . Lumbago   . Lumbar radiculopathy   . Neuropathy   . Neuropathy due to medical condition ([place])   . Nontraumatic intracranial hemorrhage ([place])   . Occasional tremors    right arm   . Pharyngitis    hx of   . Pneumonia    hx of legionnaires in 2003  . Presence of permanent cardiac pacemaker    [employer]   . Prostatitis   . Rotator cuff tear   . Sinus bradycardia    required pacer   . Sleep apnea    bipap  . Stroke [facility])    left side weakness   . Ventricular tachycardia ([facility])    hx of   . Vitreous hemorrhage ([place])     Surgeries: Procedure(s): Left BELOW KNEE AMPUTATION on 06/01/2019   Consultants:   Discharged Condition: Improved  Hospital Course: KI CORBO is an 78 y.o. male who was admitted 06/01/2019 for operative treatment ofChronic multifocal osteomyelitis of left foot ([place]). Patient has severe unremitting pain that  affects sleep, daily activities, and work/hobbies. After pre-op clearance the patient was taken to the operating room on 06/01/2019 and underwent  Procedure(s): Left BELOW KNEE AMPUTATION.    Patient was given perioperative antibiotics:  Anti-infectives (From admission, onward)   Start     Dose/Rate Route Frequency Ordered Stop   06/01/19 2300  vancomycin (VANCOCIN) IVPB 1000 mg/200 mL premix     1,000 mg 200 mL/hr over 60 Minutes Intravenous Every 12 hours 06/01/19 1626 06/02/19 0014   06/01/19 0930  vancomycin (VANCOCIN) IVPB 1000 mg/200 mL premix     1,000 mg 200 mL/hr over 60 Minutes Intravenous On call to O.R. 06/01/19 0927 06/01/19 1222   06/01/19 0930  vancomycin (VANCOCIN) IVPB 1000 mg/200 mL premix  Status:  Discontinued     1,000 mg 200 mL/hr over 60 Minutes Intravenous On call to O.R. 06/01/19 4098 06/01/19 0929       Patient was given sequential compression devices, early ambulation, and chemoprophylaxis to prevent DVT.  Patient benefited maximally from hospital stay and there were no complications.    Recent vital signs:  Patient Vitals for the past 24 hrs:  BP Temp Temp  src Pulse Resp SpO2 Height Weight  06/02/19 0619 137/76 97.7 F (36.5 C) Oral 67 18 99 % - -  06/02/19 0225 132/74 97.9 F (36.6 C) Oral (!) 59 16 100 % - -  06/01/19 2305 130/72 97.6 F (36.4 C) Oral (!) 59 16 100 % - -  06/01/19 2004 (!) 141/75 98.2 F (36.8 C) Oral 70 18 99 % - -  06/01/19 1845 137/77 98 F (36.7 C) Oral 70 16 98 % - -  06/01/19 1638 (!) 120/94 - - 70 12 99 % - -  06/01/19 1615 110/67 (!) 97.5 F (36.4 C) - 70 15 100 % - -  06/01/19 1600 138/76 - - 70 10 100 % - -  06/01/19 1545 125/62 - - 70 11 100 % - -  06/01/19 1530 127/72 (!) 97.5 F (36.4 C) - 70 11 100 % - -  06/01/19 1515 130/74 (!) 96.1 F (35.6 C) - 72 11 100 % - -  06/01/19 1501 127/77 (!) 96 F (35.6 C) - 71 12 100 % - -  06/01/19 0943 - - - - - - 5\' 7"  (1.702 m) 67.7 kg  06/01/19 0932 130/64 98 F (36.7 C)  Oral 73 16 100 % - -     Recent laboratory studies:  Recent Labs    06/01/19 1029  WBC 6.3  HGB 11.4*  HCT 37.0*  PLT 164  NA 140  K 3.2*  CL 113*  CO2 18*  BUN 28*  CREATININE 1.68*  GLUCOSE 163*  INR 1.2  CALCIUM 8.8*     Discharge Medications:   Allergies as of 06/02/2019      Reactions   Iodine Swelling   SWELLING REACTION UNSPECIFIED    Lyrica [pregabalin] Other (See Comments)   Depression, suicidal    Shellfish-derived Products Swelling   SWELLING REACTION UNSPECIFIED    Penicillins Rash, Other (See Comments)   CHILDHOOD ALLERGY PATIENT HAS HAD A PCN REACTION WITH IMMEDIATE RASH, FACIAL/TONGUE/THROAT SWELLING, SOB, OR LIGHTHEADEDNESS WITH HYPOTENSION:  #  #  YES  #  #  Has patient had a PCN reaction causing severe rash involving mucus membranes or skin necrosis: No Has patient had a PCN reaction that required hospitalization NO Has patient had a PCN reaction occurring within the last 10 years: NO   Procaine Other (See Comments)   UNSPECIFIED REACTION    Ciprofloxacin Rash   Duloxetine Nausea And Vomiting, Other (See Comments)   depression   Tape Rash   Paper tape is ok      Medication List    TAKE these medications   acetaminophen 500 MG tablet Commonly known as: TYLENOL Take 1,000 mg by mouth every 8 (eight) hours.   acetaZOLAMIDE 250 MG tablet Commonly known as: DIAMOX Take 250 mg by mouth 3 (three) times daily.   atropine 1 % ophthalmic solution Place 1 drop into the right eye daily.   Aveeno Daily Moisturizer Lotn Apply 1 application topically daily. To lower extremities.   buPROPion 150 MG 24 hr tablet Commonly known as: WELLBUTRIN XL Take 150 mg by mouth every morning.   calcium citrate 950 MG tablet Commonly known as: CALCITRATE - dosed in mg elemental calcium Take 400 mg of elemental calcium by mouth 3 (three) times daily.   digoxin 0.125 MG tablet Commonly known as: LANOXIN Take 0.0625 mg by mouth every morning.   docusate  sodium 250 MG capsule Commonly known as: COLACE Take 250 mg by mouth daily.  dorzolamidel-timolol 22.3-6.8 MG/ML Soln ophthalmic solution Commonly known as: COSOPT Place 1 drop into both eyes 2 (two) times daily. For glaucoma   escitalopram 20 MG tablet Commonly known as: LEXAPRO Take 20 mg by mouth every morning.   famotidine 10 MG tablet Commonly known as: PEPCID Take 20 mg by mouth at bedtime.   finasteride 5 MG tablet Commonly known as: PROSCAR Take 5 mg by mouth every morning.   furosemide 40 MG tablet Commonly known as: LASIX Take 40 mg by mouth daily.   gabapentin 300 MG capsule Commonly known as: NEURONTIN Take 300 mg by mouth 2 (two) times daily.   latanoprost 0.005 % ophthalmic solution Commonly known as: XALATAN Place 1 drop into both eyes at bedtime.   levothyroxine 75 MCG tablet Commonly known as: SYNTHROID Take 37.5 mcg by mouth daily before breakfast.   magnesium oxide 400 MG tablet Commonly known as: MAG-OX Take 400 mg by mouth every morning.   oxyCODONE-acetaminophen 5-325 MG tablet Commonly known as: PERCOCET/ROXICET Take 1-2 tablets by mouth every 6 (six) hours as needed for severe pain.   pilocarpine 1 % ophthalmic solution Commonly known as: PILOCAR Place 1 drop into both eyes 3 (three) times daily.   polyethylene glycol 17 g packet Commonly known as: MIRALAX / GLYCOLAX Take 17 g by mouth daily as needed for moderate constipation.   potassium chloride SA 20 MEQ tablet Commonly known as: K-DUR Take 20 mEq by mouth daily.   pravastatin 20 MG tablet Commonly known as: PRAVACHOL Take 20 mg by mouth every evening.   ProMod Liqd Take 30 mLs by mouth 3 (three) times daily with meals.   Rivaroxaban 15 MG Tabs tablet Commonly known as: XARELTO Take 15 mg by mouth every evening.   tamsulosin 0.4 MG Caps capsule Commonly known as: FLOMAX Take 0.4 mg by mouth daily. FOR URINARY SYMPTOMS   tiZANidine 2 MG tablet Commonly known as:  ZANAFLEX Take 1 tablet (2 mg total) by mouth every 8 (eight) hours as needed for muscle spasms.       Diagnostic Studies: No results found.  Disposition: Discharge disposition: 01-Home or Self Care       Discharge Instructions    Call MD / Call 911   Complete by: As directed    If you experience chest pain or shortness of breath, CALL 911 and be transported to the hospital emergency room.  If you develope a fever above 101 F, pus (white drainage) or increased drainage or redness at the wound, or calf pain, call your surgeon's office.   Diet general   Complete by: As directed       Follow-up Information    Dorna Leitz, MD. Schedule an appointment as soon as possible for a visit in 10 days.   Specialty: Orthopedic Surgery Contact information: [place] [address] [phone]            Signed: HANZEL PIZZO 06/02/2019, 8:30 AM

## 2019-06-02 NOTE — TOC Transition Note (Signed)
Transition of Care [facility]) - CM/SW Discharge Note   Patient Details  Name: Steven Duffy MRN: [id number] Date of Birth: 03-04-1941  Transition of Care [facility]) CM/SW Contact:  Leeroy Cha, RN Phone Number: 06/02/2019, 10:27 AM   Clinical Narrative:    dcd to home with care by the family dme needed is already in place.   Final next level of care: Home/Self Care Barriers to Discharge: No Barriers Identified   Patient Goals and CMS Choice Patient states their goals for this hospitalization and ongoing recovery are:: none stated family will take care of patient CMS Medicare.gov Compare Post Acute Care list provided to:: Patient    Discharge Placement                       Discharge Plan and Services   Discharge Planning Services: CM Consult                                 Social Determinants of Health (SDOH) Interventions     Readmission Risk Interventions Readmission Risk Prevention Plan 07/22/2018 07/22/2018  Transportation Screening - Complete  PCP or Specialist Appt within 5-7 Days - Not Complete  Not Complete comments Pt to make Orthopedic Surgery appointment in 2 weeks (No Data)  Home Care Screening - Complete  Medication Review (RN CM) - Complete  Some recent data might be hidden

## 2019-06-02 NOTE — Progress Notes (Signed)
Subjective: 1 Day Post-Op Procedure(s) (LRB): BELOW KNEE AMPUTATION (Left) Patient reports pain as mild.  Taking by mouth okay.  Had to be in and out cathed last night.  Has voided since.  Wife at bedside.  Ready to go home.  Has good family support at home.  No chest pain or shortness of breath.  Objective: Vital signs in last 24 hours: Temp:  [96 F (35.6 C)-98.2 F (36.8 C)] 97.7 F (36.5 C) (06/23 0619) Pulse Rate:  [59-73] 67 (06/23 0619) Resp:  [10-18] 18 (06/23 0619) BP: (110-141)/(62-94) 137/76 (06/23 0619) SpO2:  [98 %-100 %] 99 % (06/23 0619) Weight:  [67.7 kg] 67.7 kg (06/22 0943)  Intake/Output from previous day: 06/22 0701 - 06/23 0700 In: 1758.8 [P.O.:1320; I.V.:138.8; IV Piggyback:300] Out: 1525 [Urine:1450; Blood:75] Intake/Output this shift: No intake/output data recorded.  Recent Labs    06/01/19 1029  HGB 11.4*   Recent Labs    06/01/19 1029  WBC 6.3  RBC 3.63*  HCT 37.0*  PLT 164   Recent Labs    06/01/19 1029  NA 140  K 3.2*  CL 113*  CO2 18*  BUN 28*  CREATININE 1.68*  GLUCOSE 163*  CALCIUM 8.8*   Recent Labs    06/01/19 1029  INR 1.2   Left lower extremity exam: Dressing clean and dry.  The Penrose drain is intact.  The left leg is elevated on a pillow.    Assessment/Plan: 1 Day Post-Op Procedure(s) (LRB): BELOW KNEE AMPUTATION (Left)  Plan: Penrose drain pulled today. Can be discharged home.  The family is able to care for him at home.  They use a Hoyer lift to get him out of bed.  He needs to get out of bed daily.  He can resume his Xarelto at tonight's evening dose per his preop dosing. Follow-up with Dr. Berenice Primas in 2 weeks.  We will leave the dressing in place during that time.  They are instructed to put a pillow under the stump, not the knee.        MCKOY BHAKTA 06/02/2019, 8:24 AM

## 2019-06-02 NOTE — Evaluation (Signed)
Physical Therapy Evaluation Patient Details Name: Steven Duffy MRN: [id number] DOB: 1941/01/26 Today's Date: 06/02/2019   History of Present Illness  78 yo male admitted with osteomyelitis of L foot. Pt s/p L BKA. Pt with a H/O CVA with Left hemiparesis, blindness, ICH, SZ, DM, neuropathy, pacemaker, CHF, CKD, CAD, PAF.  Clinical Impression  Pt presents with dependencies in mobility secondary to the above diagnosis. Pt and family are requesting d/c home today. Pt has all equipment and HHPT services recommended. Pt has 24 hour assistance from family and aides. Pt completed a transfer to the recilner and began LE exercises. Pt and family agreeable to d/c home with continued services. Pt is d/c from acute PT.    Follow Up Recommendations Home health PT;Supervision/Assistance - 24 hour;Supervision for mobility/OOB    Equipment Recommendations  None recommended by PT    Recommendations for Other Services       Precautions / Restrictions Precautions Precautions: Fall Precaution Comments: Left UE hemiparesis      Mobility  Bed Mobility Overal bed mobility: Needs Assistance Bed Mobility: Supine to Sit     Supine to sit: Max assist     General bed mobility comments: cues for hand placement, HOB elevated, leans to right  Transfers Overall transfer level: Needs assistance   Transfers: Squat Pivot Transfers     Squat pivot transfers: +2 physical assistance;Mod assist     General transfer comment: Squat pivot to R side  Ambulation/Gait                Stairs            Wheelchair Mobility    Modified Rankin (Stroke Patients Only)       Balance Overall balance assessment: Needs assistance Sitting-balance support: Single extremity supported;Feet supported Sitting balance-Leahy Scale: Zero Sitting balance - Comments: Leans heavily at time to right side                                     Pertinent Vitals/Pain Pain Assessment: No/denies  pain    Home Living Family/patient expects to be discharged to:: Private residence Living Arrangements: Spouse/significant other Available Help at Discharge: Family;Available 24 hours/day;Home health Type of Home: House Home Access: Ramped entrance     Home Layout: One [facility] bed;Walker - 4 wheels;Bedside commode;Wheelchair - manual      Prior Function Level of Independence: Needs assistance   Gait / Transfers Assistance Needed: family reports minimal ambulation, use of hoyer and w/c           Hand Dominance        Extremity/Trunk Assessment   Upper Extremity Assessment Upper Extremity Assessment: Defer to OT evaluation    Lower Extremity Assessment Lower Extremity Assessment: Generalized weakness       Communication   Communication: No difficulties  Cognition Arousal/Alertness: Awake/alert Behavior During Therapy: WFL for tasks assessed/performed Overall Cognitive Status: Within Functional Limits for tasks assessed                                        General Comments      Exercises General Exercises - Lower Extremity Short Arc Quad: AROM;Strengthening;Right;10 reps;Supine Heel Slides: AROM;Strengthening;Right;10 reps;Supine Straight Leg Raises: Strengthening;Right;10 reps;AROM;Supine Amputee Exercises Hip ABduction/ADduction: AAROM;Strengthening;Left;10 reps;Supine Straight Leg Raises: AAROM;Strengthening;Left;10 reps;Supine   Assessment/Plan  PT Assessment All further PT needs can be met in the next venue of care  PT Problem List Decreased strength;Decreased mobility;Decreased safety awareness;Decreased range of motion;Decreased knowledge of precautions;Decreased activity tolerance;Decreased balance       PT Treatment Interventions DME instruction;Therapeutic activities;Gait training;Therapeutic exercise;Patient/family education;Balance training;Functional mobility training;Neuromuscular re-education     PT Goals (Current goals can be found in the Care Plan section)  Acute Rehab PT Goals Patient Stated Goal: To go home with therapy today    Frequency Min 5X/week   Barriers to discharge        Co-evaluation               AM-PAC PT "6 Clicks" Mobility  Outcome Measure Help needed turning from your back to your side while in a flat bed without using bedrails?: A Lot Help needed moving from lying on your back to sitting on the side of a flat bed without using bedrails?: A Lot Help needed moving to and from a bed to a chair (including a wheelchair)?: A Lot Help needed standing up from a chair using your arms (e.g., wheelchair or bedside chair)?: Total Help needed to walk in hospital room?: Total Help needed climbing 3-5 steps with a railing? : Total 6 Click Score: 9    End of Session Equipment Utilized During Treatment: Gait belt Activity Tolerance: Patient tolerated treatment well Patient left: in chair;with call bell/phone within reach;with family/visitor present Nurse Communication: Mobility status(squat pivot transfers to right side +2 safety) PT Visit Diagnosis: Difficulty in walking, not elsewhere classified (R26.2);Muscle weakness (generalized) (M62.81)    Time: 3612-2449 PT Time Calculation (min) (ACUTE ONLY): 32 min   Charges:   PT Evaluation $PT Eval Moderate Complexity: 1 Mod PT Treatments $Therapeutic Exercise: 8-22 mins        Theodoro Grist, PT  Lelon Mast 06/02/2019, 11:29 AM

## 2019-07-10 ENCOUNTER — Other Ambulatory Visit: Payer: Self-pay

## 2019-07-10 ENCOUNTER — Ambulatory Visit (HOSPITAL_COMMUNITY)
Admission: RE | Admit: 2019-07-10 | Discharge: 2019-07-10 | Disposition: A | Discharge status: Home / Self Care | Payer: Medicare (Managed Care) | Source: Ambulatory Visit | Attending: Radiation Oncology | Admitting: Radiation Oncology

## 2019-07-10 DIAGNOSIS — C3490 Malignant neoplasm of unspecified part of unspecified bronchus or lung: Secondary | ICD-10-CM | POA: Diagnosis present

## 2019-07-10 MED ORDER — SODIUM CHLORIDE (PF) 0.9 % IJ SOLN
INTRAMUSCULAR | Status: AC
  Filled 2019-07-10: qty 50

## 2019-07-10 MED ORDER — IOHEXOL 300 MG/ML  SOLN
60.0000 mL | Freq: Once | INTRAMUSCULAR | Status: AC | PRN
  Administered 2019-07-10: 60 mL via INTRAVENOUS

## 2019-07-13 ENCOUNTER — Other Ambulatory Visit: Payer: Self-pay

## 2019-07-13 ENCOUNTER — Telehealth: Payer: Self-pay | Admitting: *Deleted

## 2019-07-13 ENCOUNTER — Ambulatory Visit
Admission: RE | Admit: 2019-07-13 | Discharge: 2019-07-13 | Disposition: A | Discharge status: Home / Self Care | Payer: Medicare (Managed Care) | Source: Ambulatory Visit | Attending: Radiation Oncology | Admitting: Radiation Oncology

## 2019-07-13 DIAGNOSIS — C3412 Malignant neoplasm of upper lobe, left bronchus or lung: Secondary | ICD-10-CM

## 2019-07-13 NOTE — Telephone Encounter (Signed)
CALLED PATIENT TO INFORM OF 3 MONTH TELEPHONE FU ON 10-20-19 @ 8:30 AM, PT. AWARE OF THIS APPT.

## 2019-07-13 NOTE — Progress Notes (Signed)
Radiation Oncology         [phone] ________________________________  Follow Up Outpatient Visit - Conducted via telephone due to current COVID-19 concerns for limiting patient exposure  I spoke with the patient to conduct this consult visit via telephone to spare the patient unnecessary potential exposure in the healthcare setting during the current COVID-19 pandemic. The patient was notified in advance and was offered a [facility] meeting to allow for face to face communication but unfortunately reported that they did not have the appropriate resources/technology to support such a visit and instead preferred to proceed with a telephone visit.   ________________________________  Name: Steven Duffy        MRN: [id number]  Date of Service: 07/13/2019 DOB: [birth year]  [id number], Steven Punt, MD  Steven Adie, MD     REFERRING PHYSICIAN: Janifer Adie, MD   DIAGNOSIS: The encounter diagnosis was Malignant neoplasm of upper lobe of left lung ([place]).   HISTORY OF PRESENT ILLNESS: Steven Duffy is a 78 y.o. male with a history of Stage IA3, cT1cN0M0 NSCLC, adenocarcinoma of the left upper lobe. 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  He has sick sinus syndrome requiring a pacemaker, as well as other medical comorbidities including diabetes, coronary disease, atrial fibrillation, and was in the process of being worked up for chest pain or shortness of breath concerning for possible CHF and onset chest x-ray was found to have what appeared to be a mass in the left upper lobe.  On 08/25/2018 he underwent CT scan of the chest which revealed a 14 x 27 mm lesion in the left upper lobe.  A PET scan on 09/15/2018 revealed activity with an SUV of 5, suspicious for disease.  No evidence of adenopathy elsewhere in the chest was appreciated or elsewhere in the remainder of his body.  While he did undergo PFTs and these were acceptable to  consider surgery, however after meeting Dr. Roxan Hockey, he was not felt to be a good candidate for lobectomy. Rather he was counseled on the role of stereotactic body radiotherapy and underwent a biopsy of the LUL lesion via bronchoscopic approach on 10/13/18 and this revealed an adenocarcinoma. The patient proceeded with stereotactic body radiotherapy (SBRT) which he completed in December 2019. He has since been followed with routine surveillance imaging and his last CT on 07/10/2019 was reassuring and revealed similar appearing changes consistent with post radiation effect about the treated LUL nodule. He did have a mild interval increase in the size of a few paratracheal and mediastinal nodes. He is contacted by phone today to review these results.   PREVIOUS RADIATION THERAPY:   11/03/18-11/12/18 SBRT:  The LUL target was treated to 54 Gy in 3 fractions of 18 Gy   PAST MEDICAL HISTORY:  Past Medical History:  Diagnosis Date   Alcohol dependence in remission ([place])    Allergic rhinitis    Anemia    Anxiety    chronic   Arthritis    osteoarthritis    Atrial fibrillation (HCC)    Blind    BPH (benign prostatic hypertrophy) with urinary obstruction    Brachial neuritis or radiculitis    Carotid bruit    Colon polyps    hx of    Coronary artery disease    non obstructive per cath 2005    Depression    Dermatitis    Diabetes mellitus without complication ([place])    type II  GERD (gastroesophageal reflux disease)    Glaucoma    Headache    hx of migraines    Hematuria    hx of    Hemorrhoids    History of kidney stones    Hyperlipidemia    Hypertension    Kidney stones    stage 3   Lumbago    Lumbar radiculopathy    Neuropathy    Neuropathy due to medical condition [facility])    Nontraumatic intracranial hemorrhage (HCC)    Occasional tremors    right arm    Pharyngitis    hx of    Pneumonia    hx of legionnaires in 2003   Presence of  permanent cardiac pacemaker    [employer]    Prostatitis    Rotator cuff tear    Sinus bradycardia    required pacer    Sleep apnea    bipap   Stroke ([place])    left side weakness    Ventricular tachycardia (HCC)    hx of    Vitreous hemorrhage ([place])        PAST SURGICAL HISTORY: Past Surgical History:  Procedure Laterality Date   AMPUTATION Left 07/20/2018   Procedure: Debridement of left achilles tendon and calcaneous and left heel ulceration;  Surgeon: Dorna Leitz, MD;  Location: [facility];  Service: Orthopedics;  Laterality: Left;   AMPUTATION Left 06/01/2019   Procedure: BELOW KNEE AMPUTATION;  Surgeon: Dorna Leitz, MD;  Location: [facility];  Service: Orthopedics;  Laterality: Left;   cataract surgery      left    CIRCUMCISION     COLONOSCOPY     CYSTOSCOPY     multiple    CYSTOSCOPY WITH RETROGRADE PYELOGRAM, URETEROSCOPY AND STENT PLACEMENT Right 07/04/2015   Procedure: RIGHT RETROGRADE PYELOGRAM,  RIGHT URETEROSCOPY ;  Surgeon: Kathie Rhodes, MD;  Location: [facility];  Service: Urology;  Laterality: Right;   CYSTOSCOPY/RETROGRADE/URETEROSCOPY/STONE EXTRACTION WITH BASKET Right 01/04/2017   Procedure: CYSTOSCOPY/RETROGRADE/URETEROSCOPY/HOLMIUM LASER/STONE EXTRACTION WITH BASKET/ DOUBLE J STENT;  Surgeon: Kathie Rhodes, MD;  [facility];  Service: Urology;  Laterality: Right;   FUDUCIAL PLACEMENT Left 10/13/2018   Procedure: PLACEMENT OF FUDUCIAL TIMES THREE LEFT UPPER LOBE LUNG NODULE;  Surgeon: Melrose Nakayama, MD;  Location: [facility];  Service: Thoracic;  Laterality: Left;   hemilaminectomy     HOLMIUM LASER APPLICATION Right 1/63/8466   Procedure: HOLMIUM LASER LITHOTRIPSY AND STENT PLACEMENT ;  Surgeon: Kathie Rhodes, MD;  [facility]: [facility];  Service: Urology;  Laterality: Right;   INTRAOCULAR LENS INSERTION     IR URETERAL STENT RIGHT NEW ACCESS W/O SEP NEPHROSTOMY CATH  10/07/2017   IR URETERAL STENT RIGHT NEW ACCESS W/O SEP NEPHROSTOMY CATH  10/07/2017     neck surgery in 1995 (neck broken)     NEPHROLITHOTOMY Right 10/07/2017   Procedure: NEPHROLITHOTOMY PERCUTANEOUS;  Surgeon: Kathie Rhodes, MD;  [facility];  Service: Urology;  Laterality: Right;   placement of gastrostomy tube      hx of    PP vitrectomy      rhinologic surgery      SHOULDER SURGERY     TRACHEOSTOMY     hx of    URETHROPLASTY     VASECTOMY     VIDEO BRONCHOSCOPY WITH ENDOBRONCHIAL NAVIGATION Left 10/13/2018   Procedure: VIDEO BRONCHOSCOPY WITH ENDOBRONCHIAL NAVIGATION;  Surgeon: Melrose Nakayama, MD;  Location: [facility];  Service: Thoracic;  Laterality: Left;     FAMILY HISTORY: No family  history on file.   SOCIAL HISTORY:  reports that he quit smoking about 38 years ago. He has never used smokeless tobacco. He reports that he does not drink alcohol or use drugs.  The patient is married and lives in [place]. He is wheelchair bound and [facility] much of his [facility] time at [facility].  ALLERGIES: Iodine, Lyrica [pregabalin], Shellfish-derived products, Penicillins, Procaine, Ciprofloxacin, Duloxetine, and Tape   MEDICATIONS:  Current Outpatient Medications  Medication Sig Dispense Refill   acetaminophen (TYLENOL) 500 MG tablet Take 1,000 mg by mouth every 8 (eight) hours.      acetaZOLAMIDE (DIAMOX) 250 MG tablet Take 250 mg by mouth 3 (three) times daily.     atropine 1 % ophthalmic solution Place 1 drop into the right eye daily.     buPROPion (WELLBUTRIN XL) 150 MG 24 hr tablet Take 150 mg by mouth every morning.      calcium citrate (CALCITRATE - DOSED IN MG ELEMENTAL CALCIUM) 950 MG tablet Take 400 mg of elemental calcium by mouth 3 (three) times daily.      digoxin (LANOXIN) 0.125 MG tablet Take 0.0625 mg by mouth every morning.      docusate sodium (COLACE) 250 MG capsule Take 250 mg by mouth daily.     dorzolamidel-timolol (COSOPT) 22.3-6.8 MG/ML SOLN ophthalmic solution Place 1 drop into both eyes 2 (two) times daily. For glaucoma       escitalopram (LEXAPRO) 20 MG tablet Take 20 mg by mouth every morning.     famotidine (PEPCID) 10 MG tablet Take 20 mg by mouth at bedtime.     finasteride (PROSCAR) 5 MG tablet Take 5 mg by mouth every morning.      furosemide (LASIX) 40 MG tablet Take 40 mg by mouth daily.     gabapentin (NEURONTIN) 300 MG capsule Take 300 mg by mouth 2 (two) times daily.      latanoprost (XALATAN) 0.005 % ophthalmic solution Place 1 drop into both eyes at bedtime.     levothyroxine (SYNTHROID, LEVOTHROID) 75 MCG tablet Take 37.5 mcg by mouth daily before breakfast.     magnesium oxide (MAG-OX) 400 MG tablet Take 400 mg by mouth every morning.      Nutritional Supplements (PROMOD) LIQD Take 30 mLs by mouth 3 (three) times daily with meals.     oxyCODONE-acetaminophen (PERCOCET/ROXICET) 5-325 MG tablet Take 1-2 tablets by mouth every 6 (six) hours as needed for severe pain. 40 tablet 0   pilocarpine (PILOCAR) 1 % ophthalmic solution Place 1 drop into both eyes 3 (three) times daily.      polyethylene glycol (MIRALAX / GLYCOLAX) packet Take 17 g by mouth daily as needed for moderate constipation.     potassium chloride SA (K-DUR,KLOR-CON) 20 MEQ tablet Take 20 mEq by mouth daily.     pravastatin (PRAVACHOL) 20 MG tablet Take 20 mg by mouth every evening.     Rivaroxaban (XARELTO) 15 MG TABS tablet Take 15 mg by mouth every evening.      Sunscreens (AVEENO DAILY MOISTURIZER) LOTN Apply 1 application topically daily. To lower extremities.      tamsulosin (FLOMAX) 0.4 MG CAPS capsule Take 0.4 mg by mouth daily. FOR URINARY SYMPTOMS     tiZANidine (ZANAFLEX) 2 MG tablet Take 1 tablet (2 mg total) by mouth every 8 (eight) hours as needed for muscle spasms. 40 tablet 0   No current facility-administered medications for this encounter.      REVIEW OF SYSTEMS: On review of  systems, the patient reports that he is doing well overall. He denies any chest pain, shortness of breath, cough, fevers, chills,  night sweats, unintended weight changes. Occasionally he feels tightness in his chest with exertion but he is unable to classify if this occurred following radiation or if it predates his treatment. He denies any bowel or bladder disturbances, and denies abdominal pain, nausea or vomiting. He denies any new musculoskeletal or joint aches or pains, new skin lesions or concerns. A complete review of systems is obtained and is otherwise negative.     PHYSICAL EXAM:  Unable to assess due to nature of encounter.   ECOG = 4 0 - Asymptomatic (Fully active, able to carry on all predisease activities without restriction)  1 - Symptomatic but completely ambulatory (Restricted in physically strenuous activity but ambulatory and able to carry out work of a light or sedentary nature. For example, light housework, office work)  2 - Symptomatic, <50% in bed during the day (Ambulatory and capable of all self care but unable to carry out any work activities. Up and about more than 50% of waking hours)  3 - Symptomatic, >50% in bed, but not bedbound (Capable of only limited self-care, confined to bed or chair 50% or more of waking hours)  4 - Bedbound (Completely disabled. Cannot carry on any self-care. Totally confined to bed or chair)  5 - Death   Eustace Pen MM, Creech RH, Tormey DC, et al. [phone]). "Toxicity and response criteria of the [facility]". [place] Oncol. 5 (6): 649-55    LABORATORY DATA:  Lab Results  Component Value Date   WBC 6.3 06/01/2019   HGB 11.4 (L) 06/01/2019   HCT 37.0 (L) 06/01/2019   MCV 101.9 (H) 06/01/2019   PLT 164 06/01/2019   Lab Results  Component Value Date   NA 140 06/01/2019   K 3.2 (L) 06/01/2019   CL 113 (H) 06/01/2019   CO2 18 (L) 06/01/2019   Lab Results  Component Value Date   ALT 14 06/01/2019   AST 19 06/01/2019   ALKPHOS 97 06/01/2019   BILITOT 0.2 (L) 06/01/2019      RADIOGRAPHY: Ct Chest W Contrast  Result Date:  07/10/2019 CLINICAL DATA:  Patient with history of lung cancer. Follow-up exam. EXAM: CT CHEST WITH CONTRAST TECHNIQUE: Multidetector CT imaging of the chest was performed during intravenous contrast administration. CONTRAST:  65mL OMNIPAQUE IOHEXOL 300 MG/ML  SOLN COMPARISON:  Chest CT 12/26/2018 FINDINGS: Cardiovascular: Normal heart size. Trace fluid superior pericardial recess. Thoracic aortic vascular calcifications. Coronary arterial vascular calcifications. Mediastinum/Nodes: Interval increase in size of left paratracheal lymph node measuring 14 mm (image 60; series 2), previously 10 mm. Mild interval increase in size of 9 mm left paratracheal lymph node (image 66; series 2), previously 6 mm. Similar-appearing 8 mm right hilar node (image 74; series 2). No axillary lymphadenopathy. Normal appearance of the esophagus. Lungs/Pleura: Central airways are patent. Centrilobular and paraseptal emphysematous changes. Similar-appearing irregular nodule left upper lobe measuring 1.4 x 2.0 cm (image 35; series 5). There is increasing surrounding ground-glass and consolidative opacities within the left upper lobe suggestive of postradiation changes. There are additional markers within the left upper lobe. No pleural effusion or pneumothorax. Small clustered nodules within the central left upper lobe (image 70 8-79; series 5), stable. Upper Abdomen: Incompletely visualized bilateral nephrolithiasis. No acute process. Musculoskeletal: Thoracic spine degenerative changes. No aggressive or acute appearing osseous lesions. IMPRESSION: 1. Similar-appearing left upper lobe nodule.  There is increased surrounding ground-glass and consolidative opacity which may represent postradiation changes. Recommend attention on follow-up. 2. Mild interval increase in size of a few paratracheal mediastinal lymph nodes. Metastatic disease not excluded. Recommend attention on follow-up. 3. Aortic Atherosclerosis (ICD10-I70.0) and Emphysema  (ICD10-J43.9). Electronically Signed   By: Lovey Newcomer M.D.   On: 07/10/2019 11:52       IMPRESSION/PLAN: 1. Stage IA3, cT1cN0M0 NSCLC, adenocarcinoma of the left upper lobe.  The patient radiographically appears to be doing well. I let him know I think it would be best for a short interval scan in 3 months to follow up on the changes in the thoracic lymph nodes. He is in agreement. Otherwise the treated site appears to be consistent with post radiotherapy effect. He will contact us if he has any questions or concerns prior to his next interval scan.  2.   In situ pacemaker.  The patient continues with Dr. Evon Slack for management of his pacemaker.   Given current concerns for patient exposure during the COVID-19 pandemic, this encounter was conducted via telephone.  The patient has given verbal consent for this type of encounter. The time spent during this encounter was 15 minutes and 50% of that time was spent in the coordination of his care. The attendants for this meeting include Shona Simpson, [facility] and DUEL CONRAD and his wife Hoyle Sauer. During the encounter, Shona Simpson [facility] was located at [facility] Radiation Oncology Department.  [name] and his wife  were located at home.    Carola Rhine, PAC

## 2019-07-20 ENCOUNTER — Other Ambulatory Visit: Payer: Self-pay | Admitting: Family Medicine

## 2019-07-20 ENCOUNTER — Other Ambulatory Visit (HOSPITAL_COMMUNITY): Payer: Self-pay | Admitting: Family Medicine

## 2019-07-20 DIAGNOSIS — C3412 Malignant neoplasm of upper lobe, left bronchus or lung: Secondary | ICD-10-CM

## 2019-10-06 ENCOUNTER — Ambulatory Visit (HOSPITAL_COMMUNITY): Admission: RE | Admit: 2019-10-06 | Payer: Medicare (Managed Care) | Source: Ambulatory Visit

## 2019-10-06 ENCOUNTER — Encounter (HOSPITAL_COMMUNITY): Payer: Self-pay

## 2019-10-06 ENCOUNTER — Other Ambulatory Visit: Payer: Self-pay

## 2019-10-06 ENCOUNTER — Ambulatory Visit (HOSPITAL_COMMUNITY)
Admission: RE | Admit: 2019-10-06 | Discharge: 2019-10-06 | Disposition: A | Discharge status: Home / Self Care | Payer: Medicare (Managed Care) | Source: Ambulatory Visit | Attending: Family Medicine | Admitting: Family Medicine

## 2019-10-06 ENCOUNTER — Other Ambulatory Visit: Payer: Self-pay | Admitting: Radiation Oncology

## 2019-10-06 DIAGNOSIS — C3490 Malignant neoplasm of unspecified part of unspecified bronchus or lung: Secondary | ICD-10-CM | POA: Insufficient documentation

## 2019-10-06 DIAGNOSIS — C3412 Malignant neoplasm of upper lobe, left bronchus or lung: Secondary | ICD-10-CM | POA: Insufficient documentation

## 2019-10-09 ENCOUNTER — Telehealth: Payer: Self-pay | Admitting: *Deleted

## 2019-10-09 NOTE — Telephone Encounter (Signed)
Spoke with the patient's daughter Butch Penny and let her know that her dads case would be presented at thoracic conference on 10/15/2019 and we would be in contact with her to discuss her dad's plans going forward.  She verbalized understanding and left her call number [phone].  Will continue to follow as necessary.  Gloriajean Dell. Leonie Green, BSN

## 2019-10-13 ENCOUNTER — Telehealth: Payer: Self-pay | Admitting: Radiation Oncology

## 2019-10-13 NOTE — Telephone Encounter (Addendum)
I spoke with Butch Penny, the patient's daughter last night to let her know that Dr. Lisbeth Renshaw had reviewed her dad's scan and would like to discuss his case in conference on Thursday. While there had been nodes seen in the past there is some waxing and waning of them, further investigation into the nodes may be helpful with either repeat CT imaging versus PET.  Today I was able to speak with Dr. Bradd Burner. He has concerns about the patient's ability to rationalize his disease history and makes comments about how he just wants the cancer gone, but doesn't seem to understand that he's not a surgical candidate. He is in favor of surveillance versus aggressive intervention as his other medical conditions seem to take precedence and that his cancer may be lower down on the list of issues that could cause his death. I will make sure this is also communicated to the team during our discussion.

## 2019-10-15 ENCOUNTER — Telehealth: Payer: Self-pay | Admitting: Radiation Oncology

## 2019-10-15 DIAGNOSIS — C3490 Malignant neoplasm of unspecified part of unspecified bronchus or lung: Secondary | ICD-10-CM

## 2019-10-15 NOTE — Telephone Encounter (Signed)
I called and LM for the patient's daughter Butch Penny to call back about the recommendations from thoracic oncology conference to repeat a CT to follow along with the lymph nodes seen on recent CT in 3 months. I will place orders as well.

## 2019-10-16 ENCOUNTER — Other Ambulatory Visit: Payer: Self-pay | Admitting: *Deleted

## 2019-10-16 NOTE — Progress Notes (Signed)
The proposed treatment plan discussed at cancer conference 10/15/19 is for discussion purpose only and is not a binding recommendation.  The patient was not physically examined nor present for their treatment options.  Therefore, final treatment plans cannot be decided.

## 2019-10-19 ENCOUNTER — Ambulatory Visit: Payer: Medicare (Managed Care) | Admitting: Radiation Oncology

## 2019-10-20 ENCOUNTER — Ambulatory Visit: Payer: Medicare (Managed Care) | Admitting: Radiation Oncology

## 2020-01-15 MED ORDER — DIGOXIN 125 MCG PO TABS
0.06 | ORAL_TABLET | ORAL | Status: DC
Start: 2020-01-16 — End: 2020-01-15

## 2020-01-15 MED ORDER — PRAVASTATIN SODIUM 10 MG PO TABS
20.00 | ORAL_TABLET | ORAL | Status: DC
Start: 2020-01-16 — End: 2020-01-15

## 2020-01-15 MED ORDER — RIVAROXABAN 15 MG PO TABS
15.00 | ORAL_TABLET | ORAL | Status: DC
Start: 2020-01-15 — End: 2020-01-15

## 2020-01-15 MED ORDER — LATANOPROST 0.005 % OP SOLN
1.00 | OPHTHALMIC | Status: DC
Start: 2020-01-15 — End: 2020-01-15

## 2020-01-15 MED ORDER — PILOCARPINE HCL 1 % OP SOLN
1.00 | OPHTHALMIC | Status: DC
Start: 2020-01-15 — End: 2020-01-15

## 2020-01-15 MED ORDER — CYCLOBENZAPRINE HCL 10 MG PO TABS
5.00 | ORAL_TABLET | ORAL | Status: DC
Start: 2020-01-15 — End: 2020-01-15

## 2020-01-15 MED ORDER — BENZONATATE 100 MG PO CAPS
100.00 | ORAL_CAPSULE | ORAL | Status: DC
Start: ? — End: 2020-01-15

## 2020-01-15 MED ORDER — AMLODIPINE BESYLATE 5 MG PO TABS
10.00 | ORAL_TABLET | ORAL | Status: DC
Start: 2020-01-16 — End: 2020-01-15

## 2020-01-15 MED ORDER — ESCITALOPRAM OXALATE 10 MG PO TABS
20.00 | ORAL_TABLET | ORAL | Status: DC
Start: 2020-01-16 — End: 2020-01-15

## 2020-01-15 MED ORDER — DEXTROMETHORPHAN-GUAIFENESIN 10-100 MG/5ML PO LIQD
5.00 | ORAL | Status: DC
Start: ? — End: 2020-01-15

## 2020-01-15 MED ORDER — ACETAMINOPHEN 325 MG PO TABS
650.00 | ORAL_TABLET | ORAL | Status: DC
Start: ? — End: 2020-01-15

## 2020-01-15 MED ORDER — BUPROPION HCL ER (XL) 150 MG PO TB24
150.00 | ORAL_TABLET | ORAL | Status: DC
Start: 2020-01-16 — End: 2020-01-15

## 2020-01-15 MED ORDER — FINASTERIDE 5 MG PO TABS
5.00 | ORAL_TABLET | ORAL | Status: DC
Start: 2020-01-16 — End: 2020-01-15

## 2020-01-15 MED ORDER — LEVOTHYROXINE SODIUM 75 MCG PO TABS
75.00 | ORAL_TABLET | ORAL | Status: DC
Start: 2020-01-16 — End: 2020-01-15

## 2020-01-15 MED ORDER — TAMSULOSIN HCL 0.4 MG PO CAPS
0.40 | ORAL_CAPSULE | ORAL | Status: DC
Start: 2020-01-16 — End: 2020-01-15

## 2020-01-15 MED ORDER — DSS 100 MG PO CAPS
200.00 | ORAL_CAPSULE | ORAL | Status: DC
Start: ? — End: 2020-01-15

## 2020-01-18 ENCOUNTER — Ambulatory Visit: Payer: Self-pay | Admitting: Radiation Oncology

## 2020-01-25 ENCOUNTER — Telehealth: Payer: Self-pay | Admitting: *Deleted

## 2020-01-25 NOTE — Telephone Encounter (Signed)
CALLD PATIENT'S DAUGHTER DONNA SMITH TO ASK IF PATIENT IS STILL IN QUARANTINE DUE TO COVID -20, SPOKE WITH DAUGHTER AND SHE STATES THAT HE IS, I WILL CALL LATER IN THE WEEK TO CHECK THAT STATUS

## 2020-01-26 ENCOUNTER — Other Ambulatory Visit (HOSPITAL_COMMUNITY): Payer: Medicare (Managed Care)

## 2020-02-04 ENCOUNTER — Ambulatory Visit (HOSPITAL_COMMUNITY): Payer: Medicare (Managed Care)

## 2020-02-08 ENCOUNTER — Ambulatory Visit: Payer: Medicare (Managed Care) | Admitting: Radiation Oncology

## 2020-02-11 ENCOUNTER — Other Ambulatory Visit: Payer: Self-pay | Admitting: Radiation Oncology

## 2020-02-12 ENCOUNTER — Ambulatory Visit (HOSPITAL_COMMUNITY): Admission: RE | Admit: 2020-02-12 | Payer: Medicare (Managed Care) | Source: Ambulatory Visit

## 2020-02-15 ENCOUNTER — Ambulatory Visit: Payer: Medicare (Managed Care) | Admitting: Radiation Oncology

## 2020-02-16 ENCOUNTER — Ambulatory Visit: Payer: Self-pay | Admitting: Radiation Oncology

## 2020-02-18 MED ORDER — SODIUM CHLORIDE FLUSH 0.9 % IV SOLN
5.00 | INTRAVENOUS | Status: DC
Start: ? — End: 2020-02-18

## 2020-02-18 MED ORDER — LOPERAMIDE HCL 2 MG PO CAPS
2.00 | ORAL_CAPSULE | ORAL | Status: DC
Start: ? — End: 2020-02-18

## 2020-02-18 MED ORDER — LATANOPROST 0.005 % OP SOLN
1.00 | OPHTHALMIC | Status: DC
Start: 2020-02-18 — End: 2020-02-18

## 2020-02-18 MED ORDER — DIGOXIN 125 MCG PO TABS
0.06 | ORAL_TABLET | ORAL | Status: DC
Start: 2020-02-19 — End: 2020-02-18

## 2020-02-18 MED ORDER — TAMSULOSIN HCL 0.4 MG PO CAPS
0.40 | ORAL_CAPSULE | ORAL | Status: DC
Start: 2020-02-19 — End: 2020-02-18

## 2020-02-18 MED ORDER — DEXTROSE 10 % IV SOLN
125.00 | INTRAVENOUS | Status: DC
Start: ? — End: 2020-02-18

## 2020-02-18 MED ORDER — FAMOTIDINE 20 MG PO TABS
20.00 | ORAL_TABLET | ORAL | Status: DC
Start: 2020-02-18 — End: 2020-02-18

## 2020-02-18 MED ORDER — FERROUS SULFATE 325 (65 FE) MG PO TABS
325.00 | ORAL_TABLET | ORAL | Status: DC
Start: 2020-02-19 — End: 2020-02-18

## 2020-02-18 MED ORDER — AMILORIDE HCL 5 MG PO TABS
5.00 | ORAL_TABLET | ORAL | Status: DC
Start: 2020-02-19 — End: 2020-02-18

## 2020-02-18 MED ORDER — GENERIC EXTERNAL MEDICATION
1.00 | Status: DC
Start: 2020-02-18 — End: 2020-02-18

## 2020-02-18 MED ORDER — GLUCOSE 40 % PO GEL
15.00 | ORAL | Status: DC
Start: ? — End: 2020-02-18

## 2020-02-18 MED ORDER — PRAVASTATIN SODIUM 10 MG PO TABS
20.00 | ORAL_TABLET | ORAL | Status: DC
Start: 2020-02-18 — End: 2020-02-18

## 2020-02-18 MED ORDER — ATROPINE SULFATE 1 % OP SOLN
1.00 | OPHTHALMIC | Status: DC
Start: 2020-02-19 — End: 2020-02-18

## 2020-02-18 MED ORDER — FINASTERIDE 5 MG PO TABS
5.00 | ORAL_TABLET | ORAL | Status: DC
Start: 2020-02-19 — End: 2020-02-18

## 2020-02-18 MED ORDER — SODIUM BICARBONATE 650 MG PO TABS
650.00 | ORAL_TABLET | ORAL | Status: DC
Start: 2020-02-18 — End: 2020-02-18

## 2020-02-18 MED ORDER — CALCIUM CARBONATE 1250 (500 CA) MG PO TABS
1250.00 | ORAL_TABLET | ORAL | Status: DC
Start: 2020-02-19 — End: 2020-02-18

## 2020-02-18 MED ORDER — ESCITALOPRAM OXALATE 10 MG PO TABS
20.00 | ORAL_TABLET | ORAL | Status: DC
Start: 2020-02-19 — End: 2020-02-18

## 2020-02-18 MED ORDER — GENERIC EXTERNAL MEDICATION
Status: DC
Start: ? — End: 2020-02-18

## 2020-02-18 MED ORDER — BUPROPION HCL ER (XL) 150 MG PO TB24
150.00 | ORAL_TABLET | ORAL | Status: DC
Start: 2020-02-19 — End: 2020-02-18

## 2020-02-18 MED ORDER — SODIUM CHLORIDE FLUSH 0.9 % IV SOLN
5.00 | INTRAVENOUS | Status: DC
Start: 2020-02-18 — End: 2020-02-18

## 2020-02-18 MED ORDER — PILOCARPINE HCL 1 % OP SOLN
1.00 | OPHTHALMIC | Status: DC
Start: 2020-02-18 — End: 2020-02-18

## 2020-02-18 MED ORDER — LEVOTHYROXINE SODIUM 75 MCG PO TABS
37.50 | ORAL_TABLET | ORAL | Status: DC
Start: 2020-02-19 — End: 2020-02-18

## 2020-02-18 MED ORDER — GLUCAGON (RDNA) 1 MG IJ KIT
1.00 | PACK | INTRAMUSCULAR | Status: DC
Start: ? — End: 2020-02-18

## 2020-02-18 MED ORDER — ACETAMINOPHEN 325 MG PO TABS
650.00 | ORAL_TABLET | ORAL | Status: DC
Start: ? — End: 2020-02-18

## 2020-02-23 ENCOUNTER — Telehealth: Payer: Self-pay | Admitting: *Deleted

## 2020-02-23 NOTE — Telephone Encounter (Signed)
CALLED PATIENT'S DAUGHTER TO VERIFY APPTS. FOR 03-14-20 AND 03-15-20, LVM FOR A RETURN CALL

## 2020-02-25 ENCOUNTER — Ambulatory Visit: Payer: Medicare (Managed Care) | Admitting: Urology

## 2020-03-14 ENCOUNTER — Telehealth: Payer: Self-pay

## 2020-03-14 ENCOUNTER — Ambulatory Visit (HOSPITAL_COMMUNITY)
Admission: RE | Admit: 2020-03-14 | Discharge: 2020-03-14 | Disposition: A | Discharge status: Home / Self Care | Payer: Medicare (Managed Care) | Source: Ambulatory Visit | Attending: Radiation Oncology | Admitting: Radiation Oncology

## 2020-03-14 ENCOUNTER — Other Ambulatory Visit: Payer: Self-pay

## 2020-03-14 DIAGNOSIS — C3490 Malignant neoplasm of unspecified part of unspecified bronchus or lung: Secondary | ICD-10-CM | POA: Insufficient documentation

## 2020-03-14 NOTE — Telephone Encounter (Signed)
Phone call to family to remind of follow up telephone encounter on 03/15/20 to go over CT results from 03/14/20 daughter verbalized she understood

## 2020-03-15 ENCOUNTER — Ambulatory Visit
Admission: RE | Admit: 2020-03-15 | Discharge: 2020-03-15 | Disposition: A | Discharge status: Home / Self Care | Payer: Medicare (Managed Care) | Source: Ambulatory Visit | Attending: Radiation Oncology | Admitting: Radiation Oncology

## 2020-03-15 ENCOUNTER — Ambulatory Visit: Payer: Self-pay | Admitting: Urology

## 2020-03-15 DIAGNOSIS — C3412 Malignant neoplasm of upper lobe, left bronchus or lung: Secondary | ICD-10-CM

## 2020-03-15 DIAGNOSIS — C3490 Malignant neoplasm of unspecified part of unspecified bronchus or lung: Secondary | ICD-10-CM

## 2020-03-15 NOTE — Progress Notes (Signed)
Radiation Oncology         [phone] ________________________________  Follow Up Outpatient Visit - Conducted via telephone due to current COVID-19 concerns for limiting patient exposure  I spoke with the patient to conduct this consult visit via telephone to spare the patient unnecessary potential exposure in the healthcare setting during the current COVID-19 pandemic. The patient was notified in advance and was offered a [place] meeting to allow for face to face communication but unfortunately reported that they did not have the appropriate resources/technology to support such a visit and instead preferred to proceed with a telephone visit.   ________________________________  Name: Steven Duffy        MRN: [id number]  Date of Service: 03/15/2020 DOB: 22-Mar-1941  WU:[id number], Steven Punt, MD  Steven Adie, MD     REFERRING PHYSICIAN: Janifer Adie, MD   DIAGNOSIS: The primary encounter diagnosis was Malignant neoplasm of unspecified part of unspecified bronchus or lung ([place]). A diagnosis of Malignant neoplasm of upper lobe of left lung (HCC) was also pertinent to this visit.   HISTORY OF PRESENT ILLNESS: Steven Duffy is a 79 y.o. male with a history of Stage IA3, cT1cN0M0 NSCLC, adenocarcinoma of the left upper lobe. The patient has prior history of intracranial hemorrhage left hemiparesis that has left him wheelchair-bound.  He has sick sinus syndrome requiring a pacemaker, as well as other medical comorbidities including diabetes, coronary disease, atrial fibrillation, and was in the process of being worked up for chest pain or shortness of breath concerning for possible CHF and onset chest x-ray was found to have what appeared to be a mass in the left upper lobe.  On 08/25/2018 he underwent CT scan of the chest which revealed a 14 x 27 mm lesion in the left upper lobe.  A PET scan on 09/15/2018 revealed activity with an SUV of 5, suspicious for disease.  No evidence of adenopathy  elsewhere in the chest was appreciated or elsewhere in the remainder of his body.  While he did undergo PFTs and these were acceptable to consider surgery, however after meeting Dr. Roxan Duffy, he was not felt to be a good candidate for lobectomy. Rather he was counseled on the role of stereotactic body radiotherapy and underwent a biopsy of the LUL lesion via bronchoscopic approach on 10/13/18 and this revealed an adenocarcinoma. The patient proceeded with stereotactic body radiotherapy (SBRT) which he completed in December 2019. He has since been followed with routine surveillance imaging. In the course of following him, he did have a mild interval increase in the size of a few paratracheal and mediastinal nodes. His case has been discussed because of this in multidisciplinary thoracic oncology conference. The last interval in October 2020, recommendations were to proceed with a 3 month follow up scan. This was delayed at the request of the patient's family due to overlap with a hospitalization at an outside institution, and our recommendations to hold off until he was outpatient. He did have a scan yesterday of the chest that revealed stable architectural distortion from prior radiotherapy in the apex of the left lung without adenopathy, though his contrast sensitivity limits iv contrast. He did have cystic changes about the lateral aspect of the right kidney which was incompletely visualized felt to be a subcapsular hematoma. He has bilateral kidney stones and stigmata of atherosclerotic disease in the aorta and coronary vessels. He's contacted today by phone to discuss these results. It is notable that the last time I  spoke with his PCP Dr. Bradd Duffy at [facility], we did have a conversation about how his other medical conditions do take priority and per Dr. Bradd Duffy, he recommended considering this rather than prioritize his surveillance for his lung cancer. The patient and his family however wishes to remain  aggressive in surveillance. Though at times, the patient has verbalized he wants to still have the area previously treated removed, but despite counseling has not come around to the idea that this is not necessary or recommended, and that he would not be a surgical candidate. He is now home after two hospitalizations, one for Covid 19 in February 2020, and renal failure, sepsis and renal hematoma s/p fall after his first hospital discharge.   PREVIOUS RADIATION THERAPY:   11/03/18-11/12/18 SBRT:  The LUL target was treated to 54 Gy in 3 fractions of 18 Gy   PAST MEDICAL HISTORY:  Past Medical History:  Diagnosis Date  . Alcohol dependence in remission ([place])   . Allergic rhinitis   . Anemia   . Anxiety    chronic  . Arthritis    osteoarthritis   . Atrial fibrillation ([place])   . Blind   . BPH (benign prostatic hypertrophy) with urinary obstruction   . Brachial neuritis or radiculitis   . Carotid bruit   . Colon polyps    hx of   . Coronary artery disease    non obstructive per cath 2005   . Depression   . Dermatitis   . Diabetes mellitus without complication ([place])    type II  . GERD (gastroesophageal reflux disease)   . Glaucoma   . Headache    hx of migraines   . Hematuria    hx of   . Hemorrhoids   . History of kidney stones   . Hyperlipidemia   . Hypertension   . Kidney stones    stage 3  . Lumbago   . Lumbar radiculopathy   . Neuropathy   . Neuropathy due to medical condition ([place])   . Nontraumatic intracranial hemorrhage ([place])   . Occasional tremors    right arm   . Pharyngitis    hx of   . Pneumonia    hx of legionnaires in 2003  . Presence of permanent cardiac pacemaker    [employer]   . Prostatitis   . Rotator cuff tear   . Sinus bradycardia    required pacer   . Sleep apnea    bipap  . Stroke [facility])    left side weakness   . Ventricular tachycardia (HCC)    hx of   . Vitreous hemorrhage ([place])        PAST SURGICAL HISTORY: Past Surgical  History:  Procedure Laterality Date  . AMPUTATION Left 07/20/2018   Procedure: Debridement of left achilles tendon and calcaneous and left heel ulceration;  Surgeon: Dorna Leitz, MD;  Location: [facility];  Service: Orthopedics;  Laterality: Left;  . AMPUTATION Left 06/01/2019   Procedure: BELOW KNEE AMPUTATION;  Surgeon: Dorna Leitz, MD;  Location: [facility];  Service: Orthopedics;  Laterality: Left;  . cataract surgery      left   . CIRCUMCISION    . COLONOSCOPY    . CYSTOSCOPY     multiple   . CYSTOSCOPY WITH RETROGRADE PYELOGRAM, URETEROSCOPY AND STENT PLACEMENT Right 07/04/2015   Procedure: RIGHT RETROGRADE PYELOGRAM,  RIGHT URETEROSCOPY ;  Surgeon: Kathie Rhodes, MD;  Location: [facility];  Service: Urology;  Laterality: Right;  . CYSTOSCOPY/RETROGRADE/URETEROSCOPY/STONE EXTRACTION  WITH BASKET Right 01/04/2017   Procedure: CYSTOSCOPY/RETROGRADE/URETEROSCOPY/HOLMIUM LASER/STONE EXTRACTION WITH BASKET/ DOUBLE J STENT;  Surgeon: Kathie Rhodes, MD;  Location: [facility];  Service: Urology;  Laterality: Right;  . FUDUCIAL PLACEMENT Left 10/13/2018   Procedure: PLACEMENT OF FUDUCIAL TIMES THREE LEFT UPPER LOBE LUNG NODULE;  Surgeon: Melrose Nakayama, MD;  Location: [place];  Service: Thoracic;  Laterality: Left;  . hemilaminectomy    . HOLMIUM LASER APPLICATION Right 7/35/3299   Procedure: HOLMIUM LASER LITHOTRIPSY AND STENT PLACEMENT ;  Surgeon: Kathie Rhodes, MD;  Location: [facility];  Service: Urology;  Laterality: Right;  . INTRAOCULAR LENS INSERTION    . IR URETERAL STENT RIGHT NEW ACCESS W/O SEP NEPHROSTOMY CATH  10/07/2017  . IR URETERAL STENT RIGHT NEW ACCESS W/O SEP NEPHROSTOMY CATH  10/07/2017  . neck surgery in 1995 (neck broken)    . NEPHROLITHOTOMY Right 10/07/2017   Procedure: NEPHROLITHOTOMY PERCUTANEOUS;  Surgeon: Kathie Rhodes, MD;  Location: [facility];  Service: Urology;  Laterality: Right;  . placement of gastrostomy tube      hx of   . PP vitrectomy     . rhinologic surgery     . SHOULDER  SURGERY    . TRACHEOSTOMY     hx of   . URETHROPLASTY    . VASECTOMY    . VIDEO BRONCHOSCOPY WITH ENDOBRONCHIAL NAVIGATION Left 10/13/2018   Procedure: VIDEO BRONCHOSCOPY WITH ENDOBRONCHIAL NAVIGATION;  Surgeon: Melrose Nakayama, MD;  Location: [place];  Service: Thoracic;  Laterality: Left;     FAMILY HISTORY: No family history on file.   SOCIAL HISTORY:  reports that he quit smoking about 39 years ago. He has never used smokeless tobacco. He reports that he does not drink alcohol or use drugs.  The patient is married and lives in [place]. He is wheelchair bound and spends much of his day time at [facility]. His wife Hoyle Sauer and daughter Butch Penny or Dorian Pod are usually a part of the conversation when we meet or discuss his case.  ALLERGIES: Iodine, Lyrica [pregabalin], Shellfish-derived products, Penicillins, Procaine, Ciprofloxacin, Duloxetine, and Tape   MEDICATIONS:  Current Outpatient Medications  Medication Sig Dispense Refill  . acetaminophen (TYLENOL) 500 MG tablet Take 1,000 mg by mouth every 8 (eight) hours.     Marland Kitchen atropine 1 % ophthalmic solution Place 1 drop into the right eye daily.    Marland Kitchen buPROPion (WELLBUTRIN XL) 150 MG 24 hr tablet Take 150 mg by mouth every morning.     . calcium citrate (CALCITRATE - DOSED IN MG ELEMENTAL CALCIUM) 950 MG tablet Take 400 mg of elemental calcium by mouth 3 (three) times daily.     . digoxin (LANOXIN) 0.125 MG tablet Take 0.0625 mg by mouth every morning.     . docusate sodium (COLACE) 250 MG capsule Take 250 mg by mouth daily.    . dorzolamidel-timolol (COSOPT) 22.3-6.8 MG/ML SOLN ophthalmic solution Place 1 drop into both eyes 2 (two) times daily. For glaucoma     . escitalopram (LEXAPRO) 20 MG tablet Take 20 mg by mouth every morning.    . famotidine (PEPCID) 10 MG tablet Take 20 mg by mouth at bedtime.    . finasteride (PROSCAR) 5 MG tablet Take 5 mg by mouth every morning.     . furosemide (LASIX) 40 MG tablet Take 40 mg by mouth daily.      Marland Kitchen gabapentin (NEURONTIN) 300 MG capsule Take 300 mg by mouth 2 (two) times daily.     Marland Kitchen  latanoprost (XALATAN) 0.005 % ophthalmic solution Place 1 drop into both eyes at bedtime.    Marland Kitchen levothyroxine (SYNTHROID, LEVOTHROID) 75 MCG tablet Take 37.5 mcg by mouth daily before breakfast.    . Nutritional Supplements (PROMOD) LIQD Take 30 mLs by mouth 3 (three) times daily with meals.    Marland Kitchen oxyCODONE-acetaminophen (PERCOCET/ROXICET) 5-325 MG tablet Take 1-2 tablets by mouth every 6 (six) hours as needed for severe pain. 40 tablet 0  . pilocarpine (PILOCAR) 1 % ophthalmic solution Place 1 drop into both eyes 3 (three) times daily.     . polyethylene glycol (MIRALAX / GLYCOLAX) packet Take 17 g by mouth daily as needed for moderate constipation.    . potassium chloride SA (K-DUR,KLOR-CON) 20 MEQ tablet Take 20 mEq by mouth daily.    . pravastatin (PRAVACHOL) 20 MG tablet Take 20 mg by mouth every evening.    . Sunscreens (AVEENO DAILY MOISTURIZER) LOTN Apply 1 application topically daily. To lower extremities.     . tamsulosin (FLOMAX) 0.4 MG CAPS capsule Take 0.4 mg by mouth daily. FOR URINARY SYMPTOMS    . tiZANidine (ZANAFLEX) 2 MG tablet Take 1 tablet (2 mg total) by mouth every 8 (eight) hours as needed for muscle spasms. 40 tablet 0  . acetaZOLAMIDE (DIAMOX) 250 MG tablet Take 250 mg by mouth 3 (three) times daily.    . magnesium oxide (MAG-OX) 400 MG tablet Take 400 mg by mouth every morning.     . Rivaroxaban (XARELTO) 15 MG TABS tablet Take 15 mg by mouth every evening.      No current facility-administered medications for this encounter.     REVIEW OF SYSTEMS: On review of systems, the patient reports that he is doing well overall. No concerns or complaints are verablized.     PHYSICAL EXAM:  Unable to assess due to nature of encounter.   ECOG = 4 0 - Asymptomatic (Fully active, able to carry on all predisease activities without restriction)  1 - Symptomatic but completely ambulatory  (Restricted in physically strenuous activity but ambulatory and able to carry out work of a light or sedentary nature. For example, light housework, office work)  2 - Symptomatic, <50% in bed during the day (Ambulatory and capable of all self care but unable to carry out any work activities. Up and about more than 50% of waking hours)  3 - Symptomatic, >50% in bed, but not bedbound (Capable of only limited self-care, confined to bed or chair 50% or more of waking hours)  4 - Bedbound (Completely disabled. Cannot carry on any self-care. Totally confined to bed or chair)  5 - Death   Eustace Pen MM, Creech RH, Tormey DC, et al. [phone]). "Toxicity and response criteria of the [facility]". [place] Oncol. 5 (6): 649-55    LABORATORY DATA:  Lab Results  Component Value Date   WBC 6.3 06/01/2019   HGB 11.4 (L) 06/01/2019   HCT 37.0 (L) 06/01/2019   MCV 101.9 (H) 06/01/2019   PLT 164 06/01/2019   Lab Results  Component Value Date   NA 140 06/01/2019   K 3.2 (L) 06/01/2019   CL 113 (H) 06/01/2019   CO2 18 (L) 06/01/2019   Lab Results  Component Value Date   ALT 14 06/01/2019   AST 19 06/01/2019   ALKPHOS 97 06/01/2019   BILITOT 0.2 (L) 06/01/2019      RADIOGRAPHY: CT Chest Wo Contrast  Result Date: 03/14/2020 CLINICAL DATA:  Non-small cell  lung cancer, restaging. EXAM: CT CHEST WITHOUT CONTRAST TECHNIQUE: Multidetector CT imaging of the chest was performed following the standard protocol without IV contrast. COMPARISON:  02/07/2020 and CT abdomen pelvis 02/11/2020. FINDINGS: Cardiovascular: Atherosclerotic calcification of the aorta, aortic valve and coronary arteries. Heart size normal. No pericardial effusion. Mediastinum/Nodes: No pathologically enlarged mediastinal or axillary lymph nodes. Hilar regions are difficult to evaluate without IV contrast. Esophagus is grossly unremarkable. Lungs/Pleura: Irregular nodular consolidation, surrounding architectural  distortion and fiducial markers in the apical left upper lobe, as before. Centrilobular emphysema. 2 mm posterior segment right upper lobe nodule (7/61), unchanged. No pleural fluid. Airway is unremarkable. Upper Abdomen: Visualized portions of the liver, gallbladder and adrenal glands are unremarkable. Stones are seen in the kidneys bilaterally. A partially imaged and somewhat thick-walled low-density fluid collection along the lateral margin of the right kidney is incompletely imaged, measuring at least 4.0 x 7.0 cm. It exerts mass effect on the right kidney. Stones are seen in the kidneys bilaterally. Visualized portions of the spleen, pancreas, stomach and bowel are unremarkable with exception of a small hiatal hernia. No upper abdominal adenopathy. Musculoskeletal: Degenerative changes in the spine. No worrisome lytic or sclerotic lesions. IMPRESSION: 1. Stable left upper lobe nodular consolidation with surrounding post treatment changes. No evidence of distant metastatic disease. 2. Partially imaged subacute subcapsular hematoma along the right kidney. 3. Bilateral renal stones. 4. Aortic atherosclerosis (ICD10-I70.0). Coronary artery calcification. Electronically Signed   By: Lorin Picket M.D.   On: 03/14/2020 12:59       IMPRESSION/PLAN: 1. Stage IA3, cT1cN0M0 NSCLC, adenocarcinoma of the left upper lobe.  The patient radiographically appears to be without active disease. His nodes had previously been of concern, but do not appear suspicious on imaging at this time. Given the stability, we will move back to 6 month scans in surveillance. He and his family are in agreement to proceed in this manner and orders will be placed for CT in 6 months. 2.   Recent right renal hematoma. He will be followed by Dr. Bradd Duffy for this but they are aware it may take months for this to resolve. 3. In situ pacemaker.  The patient continues with Dr. Evon Slack for management of his pacemaker.   Given  current concerns for patient exposure during the COVID-19 pandemic, this encounter was conducted via telephone.  The patient has given verbal consent for this type of encounter. The time spent during this encounter was 15 minutes and 50% of that time was spent in the coordination of his care. The attendants for this meeting include Shona Simpson, [facility] and CELIA GIBBONS and his wife Hoyle Sauer and daughter Dorian Pod. During the encounter, Shona Simpson [facility] was located at [facility] Radiation Oncology Department.  [name] and his wife and daughter  were located at home.    Carola Rhine, PAC

## 2020-03-24 ENCOUNTER — Telehealth: Payer: Self-pay | Admitting: Radiation Oncology

## 2020-03-24 MED ORDER — GENERIC EXTERNAL MEDICATION
12.50 | Status: DC
Start: 2020-03-24 — End: 2020-03-24

## 2020-03-24 MED ORDER — TAMSULOSIN HCL 0.4 MG PO CAPS
0.40 | ORAL_CAPSULE | ORAL | Status: DC
Start: 2020-03-25 — End: 2020-03-24

## 2020-03-24 MED ORDER — FAMOTIDINE 20 MG PO TABS
20.00 | ORAL_TABLET | ORAL | Status: DC
Start: 2020-03-24 — End: 2020-03-24

## 2020-03-24 MED ORDER — MORPHINE SULFATE 2 MG/ML IJ SOLN
2.00 | INTRAMUSCULAR | Status: DC
Start: ? — End: 2020-03-24

## 2020-03-24 MED ORDER — GENERIC EXTERNAL MEDICATION
1.00 | Status: DC
Start: 2020-03-25 — End: 2020-03-24

## 2020-03-24 MED ORDER — BUPROPION HCL ER (XL) 150 MG PO TB24
150.00 | ORAL_TABLET | ORAL | Status: DC
Start: 2020-03-25 — End: 2020-03-24

## 2020-03-24 MED ORDER — LEVOTHYROXINE SODIUM 75 MCG PO TABS
37.50 | ORAL_TABLET | ORAL | Status: DC
Start: 2020-03-25 — End: 2020-03-24

## 2020-03-24 MED ORDER — FINASTERIDE 5 MG PO TABS
5.00 | ORAL_TABLET | ORAL | Status: DC
Start: 2020-03-25 — End: 2020-03-24

## 2020-03-24 MED ORDER — TRAMADOL HCL 50 MG PO TABS
50.00 | ORAL_TABLET | ORAL | Status: DC
Start: ? — End: 2020-03-24

## 2020-03-24 MED ORDER — ACETAMINOPHEN 325 MG PO TABS
650.00 | ORAL_TABLET | ORAL | Status: DC
Start: ? — End: 2020-03-24

## 2020-03-24 MED ORDER — ONDANSETRON HCL 4 MG/2ML IJ SOLN
4.00 | INTRAMUSCULAR | Status: DC
Start: ? — End: 2020-03-24

## 2020-03-24 MED ORDER — PILOCARPINE HCL 1 % OP SOLN
1.00 | OPHTHALMIC | Status: DC
Start: 2020-03-24 — End: 2020-03-24

## 2020-03-24 MED ORDER — FERROUS SULFATE 325 (65 FE) MG PO TABS
325.00 | ORAL_TABLET | ORAL | Status: DC
Start: 2020-03-25 — End: 2020-03-24

## 2020-03-24 MED ORDER — ESCITALOPRAM OXALATE 10 MG PO TABS
10.00 | ORAL_TABLET | ORAL | Status: DC
Start: 2020-03-25 — End: 2020-03-24

## 2020-03-24 MED ORDER — DIGOXIN 125 MCG PO TABS
0.06 | ORAL_TABLET | ORAL | Status: DC
Start: 2020-03-26 — End: 2020-03-24

## 2020-03-24 MED ORDER — PRAVASTATIN SODIUM 10 MG PO TABS
20.00 | ORAL_TABLET | ORAL | Status: DC
Start: 2020-03-24 — End: 2020-03-24

## 2020-03-24 MED ORDER — LATANOPROST 0.005 % OP SOLN
1.00 | OPHTHALMIC | Status: DC
Start: 2020-03-24 — End: 2020-03-24

## 2020-03-24 MED ORDER — MELATONIN 3 MG PO TABS
3.00 | ORAL_TABLET | ORAL | Status: DC
Start: ? — End: 2020-03-24

## 2020-03-24 NOTE — Telephone Encounter (Signed)
The patient's daughter Butch Penny called to review his scan results and she let me know he is currently hospitalized as of today with pain and chest pain issues. We will follow along expectantly with his course and anticipate CT in 6 months if he is medically stable.

## 2020-03-26 MED ORDER — METHOHEXITAL SODIUM IV
1.00 | INTRAVENOUS | Status: DC
Start: ? — End: 2020-03-26

## 2020-03-26 MED ORDER — DICLOXACILLIN SODIUM 62.5 MG/5ML PO SUSR
10.00 | ORAL | Status: DC
Start: ? — End: 2020-03-26

## 2020-03-26 MED ORDER — BL TUSSIN CF 30-10-100 MG/5ML PO SYRP
4.00 | ORAL_SOLUTION | ORAL | Status: DC
Start: ? — End: 2020-03-26

## 2020-03-26 MED ORDER — Medication
1500.00 | Status: DC
Start: 2020-03-26 — End: 2020-03-26

## 2020-03-26 MED ORDER — RA SINUS FORMULA DAYTIME PO
1.00 | ORAL | Status: DC
Start: ? — End: 2020-03-26

## 2020-03-26 MED ORDER — CALCIUM-VITAMIN D-VITAMIN K 600-1000-90 MG-UNT-MCG PO TABS
50.00 | ORAL_TABLET | ORAL | Status: DC
Start: ? — End: 2020-03-26

## 2020-03-26 MED ORDER — POLYETHYLENE GLYCOL 3350 17 GM/SCOOP PO POWD
17.00 | ORAL | Status: DC
Start: ? — End: 2020-03-26

## 2020-04-09 DEATH — deceased

## 2020-09-19 ENCOUNTER — Ambulatory Visit: Payer: Medicaid Other | Admitting: Radiation Oncology

## 2022-06-25 LAB — CBC WITH DIFFERENTIAL
Basophils %: 0.03 10*9/L (ref 0.00–0.10)
Basophils %: 0.4 % (ref 0.0–2.0)
Eosinophils %: 1 % (ref 0.0–6.0)
Eosinophils: 0.07 10*9/L (ref 0.00–0.40)
Hematocrit: 40 % — ABNORMAL LOW (ref 41.0–52.0)
Hemoglobin: 13.3 g/dL — ABNORMAL LOW (ref 13.5–17.5)
Immature Granulocytes %: 0.3 % (ref 0.0–0.9)
Lymphocytes %: 1.06 10*9/L (ref 0.80–3.00)
Lymphocytes: 15.4 % (ref 13.0–44.0)
MCHC: 33.3 g/dL (ref 32.0–36.0)
MCV: 88 fL (ref 80–100)
Monocytes %: 0.42 10*9/L (ref 0.05–0.80)
Monocytes %: 6.1 % (ref 2.0–10.0)
Neutrophils %: 76.8 % (ref 40.0–80.0)
Neutrophils: 5.29 10*9/L (ref 1.60–5.50)
Platelets: 135 10*9/L — ABNORMAL LOW (ref 150–450)
RBC: 4.54 10*12/L (ref 4.50–5.90)
RDW-CV: 13.8 % (ref 11.5–14.5)
WBC: 6.9 10*9/L (ref 4.4–11.3)

## 2022-06-25 LAB — COMPREHENSIVE METABOLIC PANEL
ALT: 19 U/L (ref 10–52)
AST: 25 U/L (ref 9–39)
Albumin: 3.7 g/dL (ref 3.4–5.0)
Alkaline Phosphatase: 71 U/L (ref 33–136)
Anion Gap: 11 mmol/L (ref 10–20)
Calcium: 9 mg/dL (ref 8.6–10.3)
Chloride: 106 mmol/L (ref 98–107)
Creatinine: 0.9 mg/dL (ref 0.50–1.30)
Glucose: 80 mg/dL (ref 74–99)
HCO3: 28 mmol/L (ref 21–32)
Potassium: 4.7 mmol/L (ref 3.5–5.3)
Sodium: 140 mmol/L (ref 136–145)
Total Bilirubin: 0.5 mg/dL (ref 0.0–1.2)
Total Protein: 6.7 g/dL (ref 6.4–8.2)
Urea Nitrogen: 33 mg/dL — ABNORMAL HIGH (ref 6–23)
eGFR Male: 86 mL/min/{1.73_m2} (ref >90)

## 2022-06-25 LAB — LACTATE, SERUM: Lactate: 0.6 mmol/L (ref 0.4–2.0)
